# Patient Record
Sex: Female | Born: 1965
Health system: Southern US, Community
[De-identification: ages and names within clinical notes are randomized; demographics above are authoritative.]

## PROBLEM LIST (undated history)

## (undated) DIAGNOSIS — M722 Plantar fascial fibromatosis: Secondary | ICD-10-CM

## (undated) DIAGNOSIS — K5900 Constipation, unspecified: Secondary | ICD-10-CM

## (undated) DIAGNOSIS — F32A Depression, unspecified: Secondary | ICD-10-CM

## (undated) DIAGNOSIS — R0602 Shortness of breath: Secondary | ICD-10-CM

## (undated) DIAGNOSIS — M549 Dorsalgia, unspecified: Secondary | ICD-10-CM

## (undated) DIAGNOSIS — E039 Hypothyroidism, unspecified: Secondary | ICD-10-CM

## (undated) DIAGNOSIS — F419 Anxiety disorder, unspecified: Secondary | ICD-10-CM

## (undated) DIAGNOSIS — A609 Anogenital herpesviral infection, unspecified: Secondary | ICD-10-CM

## (undated) DIAGNOSIS — F329 Major depressive disorder, single episode, unspecified: Secondary | ICD-10-CM

## (undated) DIAGNOSIS — C50919 Malignant neoplasm of unspecified site of unspecified female breast: Secondary | ICD-10-CM

## (undated) DIAGNOSIS — T7840XA Allergy, unspecified, initial encounter: Secondary | ICD-10-CM

## (undated) DIAGNOSIS — M2550 Pain in unspecified joint: Secondary | ICD-10-CM

## (undated) DIAGNOSIS — E785 Hyperlipidemia, unspecified: Secondary | ICD-10-CM

## (undated) HISTORY — DX: Hyperlipidemia, unspecified: E78.5

## (undated) HISTORY — PX: ENDOMETRIAL ABLATION: SHX621

## (undated) HISTORY — DX: Plantar fascial fibromatosis: M72.2

## (undated) HISTORY — DX: Pain in unspecified joint: M25.50

## (undated) HISTORY — DX: Allergy, unspecified, initial encounter: T78.40XA

## (undated) HISTORY — PX: MOUTH SURGERY: SHX715

## (undated) HISTORY — DX: Shortness of breath: R06.02

## (undated) HISTORY — DX: Dorsalgia, unspecified: M54.9

## (undated) HISTORY — DX: Anxiety disorder, unspecified: F41.9

## (undated) HISTORY — DX: Constipation, unspecified: K59.00

---

## 1999-07-23 ENCOUNTER — Encounter: Payer: Self-pay | Admitting: *Deleted

## 1999-07-23 ENCOUNTER — Encounter: Admission: RE | Admit: 1999-07-23 | Discharge: 1999-07-23 | Payer: Self-pay | Admitting: *Deleted

## 1999-09-09 ENCOUNTER — Other Ambulatory Visit: Admission: RE | Admit: 1999-09-09 | Discharge: 1999-09-09 | Payer: Self-pay | Admitting: Obstetrics and Gynecology

## 1999-12-12 ENCOUNTER — Other Ambulatory Visit: Admission: RE | Admit: 1999-12-12 | Discharge: 1999-12-12 | Payer: Self-pay | Admitting: Obstetrics and Gynecology

## 2000-02-12 ENCOUNTER — Encounter: Payer: Self-pay | Admitting: Obstetrics and Gynecology

## 2000-02-12 ENCOUNTER — Ambulatory Visit (HOSPITAL_COMMUNITY): Admission: RE | Admit: 2000-02-12 | Discharge: 2000-02-12 | Payer: Self-pay | Admitting: Obstetrics and Gynecology

## 2000-10-13 ENCOUNTER — Inpatient Hospital Stay (HOSPITAL_COMMUNITY): Admission: AD | Admit: 2000-10-13 | Discharge: 2000-10-13 | Payer: Self-pay | Admitting: Gynecology

## 2001-03-18 ENCOUNTER — Other Ambulatory Visit: Admission: RE | Admit: 2001-03-18 | Discharge: 2001-03-18 | Payer: Self-pay | Admitting: Gynecology

## 2001-12-13 ENCOUNTER — Inpatient Hospital Stay (HOSPITAL_COMMUNITY): Admission: AD | Admit: 2001-12-13 | Discharge: 2001-12-13 | Payer: Self-pay | Admitting: Obstetrics and Gynecology

## 2002-03-06 ENCOUNTER — Inpatient Hospital Stay (HOSPITAL_COMMUNITY): Admission: AD | Admit: 2002-03-06 | Discharge: 2002-03-09 | Payer: Self-pay | Admitting: Obstetrics and Gynecology

## 2002-03-10 ENCOUNTER — Encounter: Admission: RE | Admit: 2002-03-10 | Discharge: 2002-04-09 | Payer: Self-pay | Admitting: Obstetrics and Gynecology

## 2002-04-08 ENCOUNTER — Other Ambulatory Visit: Admission: RE | Admit: 2002-04-08 | Discharge: 2002-04-08 | Payer: Self-pay | Admitting: Obstetrics and Gynecology

## 2002-11-17 ENCOUNTER — Encounter: Admission: RE | Admit: 2002-11-17 | Discharge: 2002-11-17 | Payer: Self-pay

## 2003-06-14 ENCOUNTER — Other Ambulatory Visit: Admission: RE | Admit: 2003-06-14 | Discharge: 2003-06-14 | Payer: Self-pay | Admitting: Obstetrics and Gynecology

## 2003-08-09 ENCOUNTER — Ambulatory Visit (HOSPITAL_COMMUNITY): Admission: RE | Admit: 2003-08-09 | Discharge: 2003-08-09 | Payer: Self-pay | Admitting: Obstetrics and Gynecology

## 2003-08-09 IMAGING — US US OB DETAIL+14 WK
1 series · 13 of 28 positions shown · non-contrast
Comparison: none

CLINICAL DATA: Advanced maternal age.  Assess anatomy.  LMP [DATE].  G3 P1 SAB1.

[Series 1: ob · 0.26mm/px · 13 of 80 slices shown]
[im 3/80]
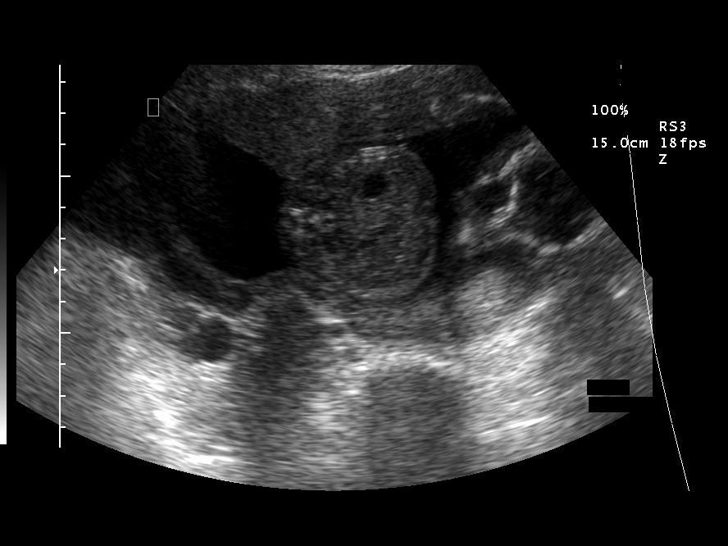
[im 9/80]
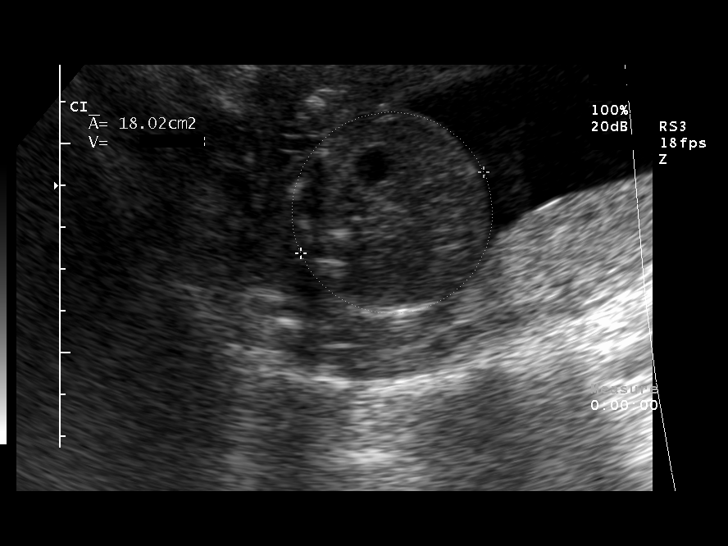
[im 15/80]
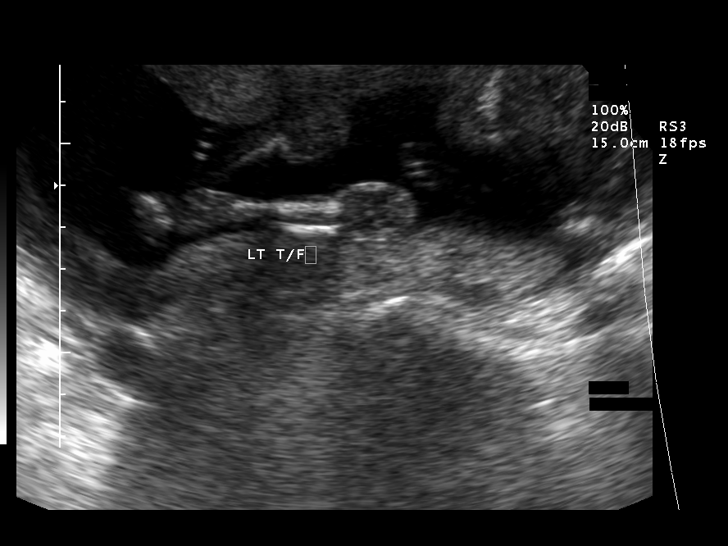
[im 21/80]
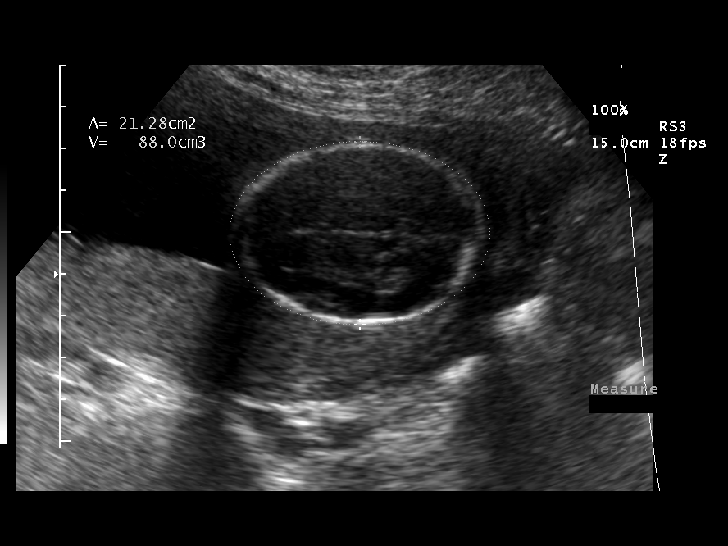
[im 27/80]
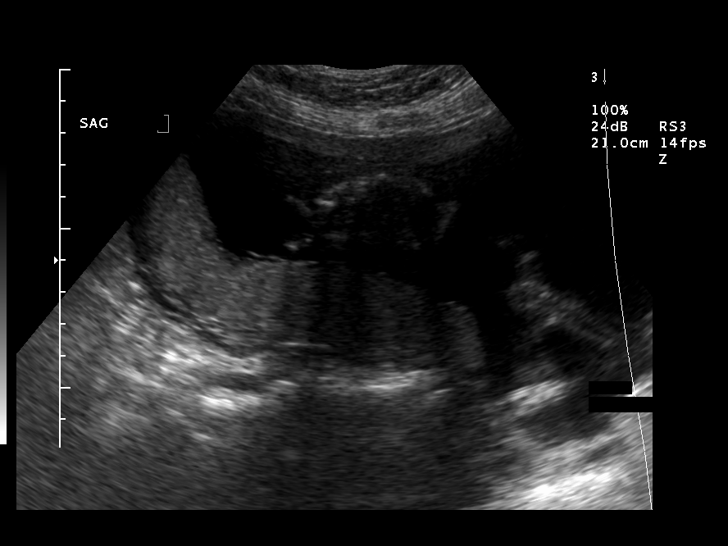
[im 33/80]
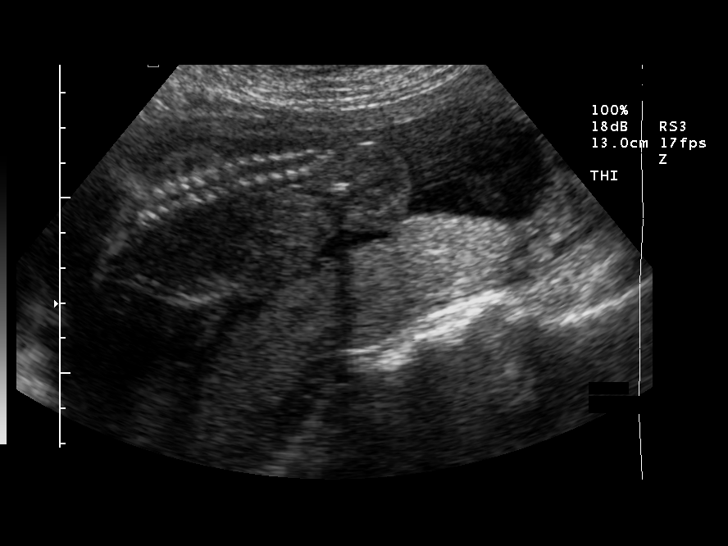
[im 41/80]
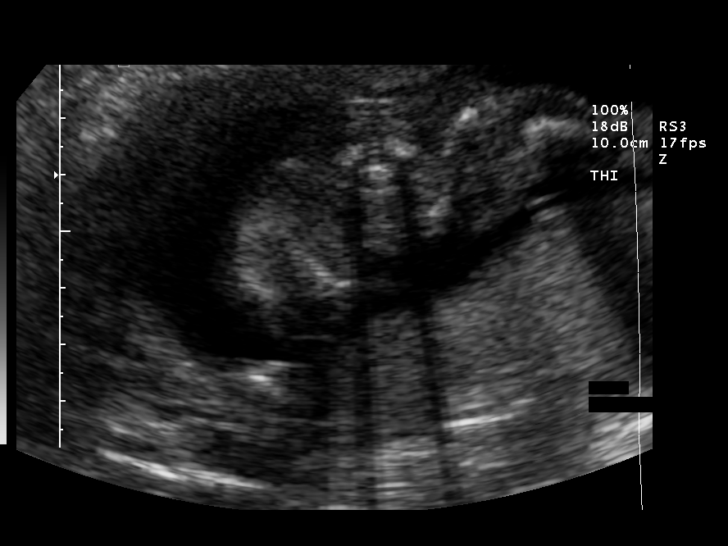
[im 47/80]
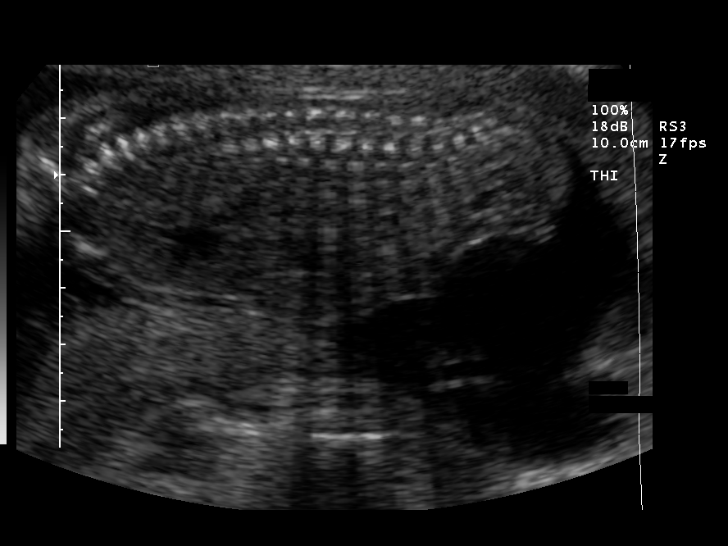
[im 53/80]
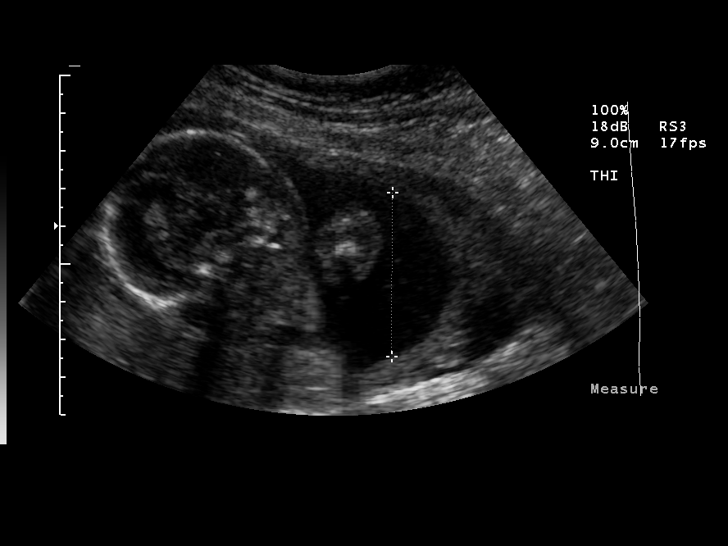
[im 59/80]
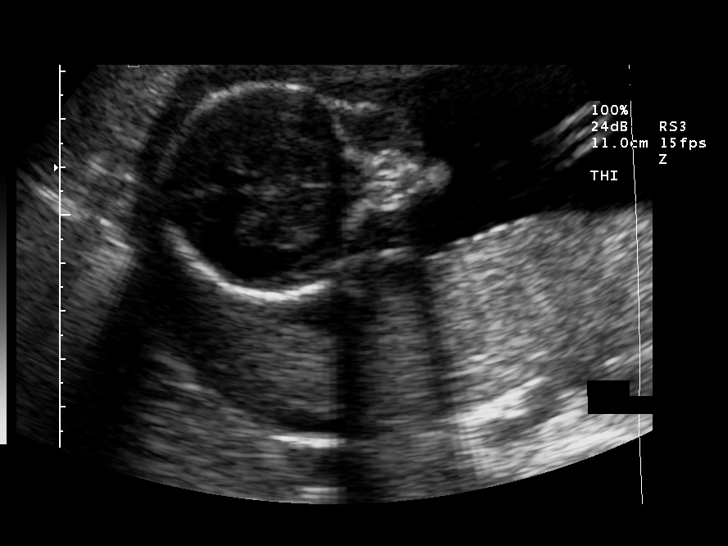
[im 65/80]
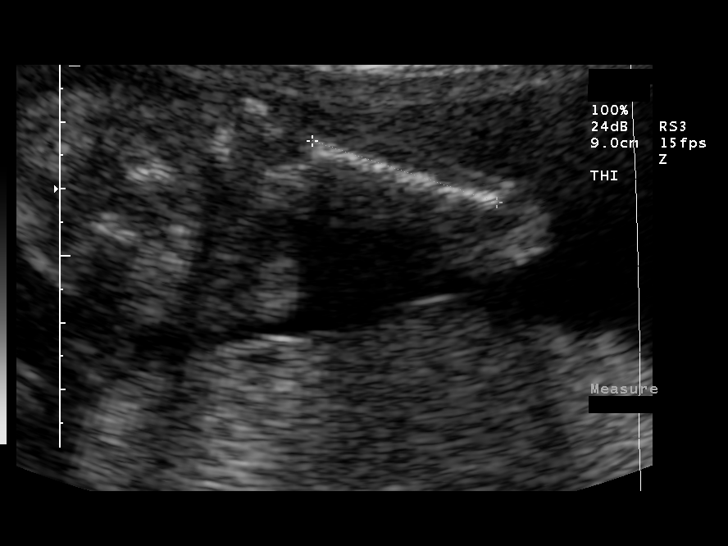
[im 71/80]
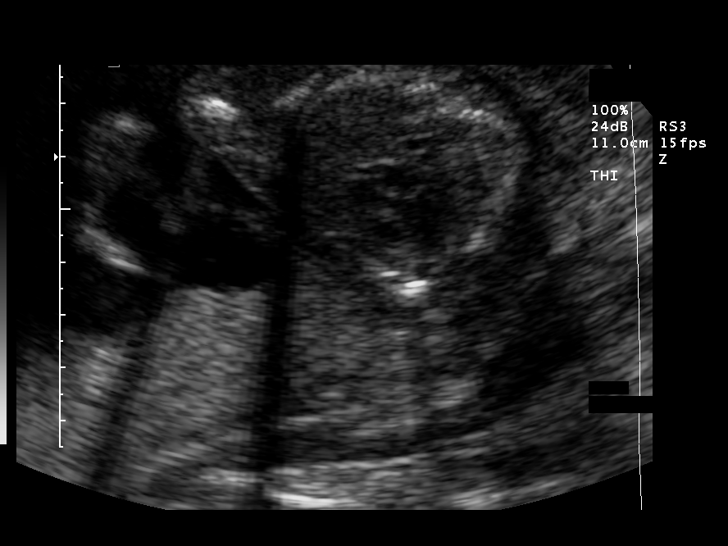
[im 77/80]
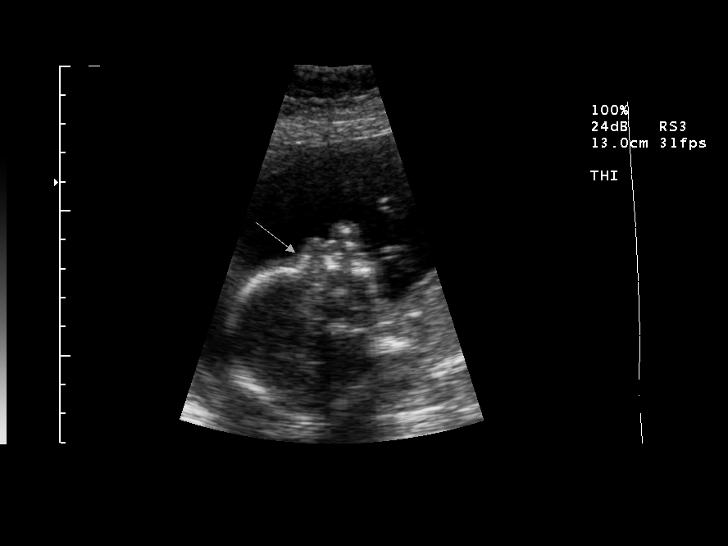

[13 of 28 positions shown; findings below may reference images not displayed]

DETAILED OBSTETRICAL ULTRASOUND 

 Number of Fetuses:  1
 Heart Rate:  155
 Movement:  Yes
 Breathing:  Yes
 Presentation:  Variable
 Placental Location:  Posterior
 Grade:  I
 Previa:  No
 Amniotic Fluid (Subjective):  Normal
 Amniotic Fluid (Objective):  4.3 cm Vertical pocket 

 FETAL BIOMETRY
 BPD:  4.4 cm   19 w 2 d
 HC:  16.8 cm   19 w 3 d
 AC:  15.2 cm   20 w 3 d
 FL:  3.0 cm  19 w 1 d
 HL:  3.0 cm   19 w 6 d

 MEAN GA:  19 w 4 d
 GA BY LMP:  18 w 6 d (assigned)

 FETAL ANATOMY
 Lateral Ventricles:  Visualized 
 Thalami/CSP:  Visualized 
 Posterior Fossa:  Visualized 
 Nuchal Region:  Visualized 
 Spine:  Visualized 
 4 Chamber Heart on Left:  Visualized 
 Stomach on Left:  Visualized 
 3 Vessel Cord:  Visualized 
 Cord Insertion Site:  Visualized 
 Kidneys:  Visualized 
 Bladder:  Visualized 
 Extremities:  Visualized 

 ADDITIONAL ANATOMY VISUALIZED:  Upper lip, orbits, diaphragm, heel, 5th digit, female genitalia, and nasal bone.

 Evaluation limited by:  Fetal position

 MATERNAL FINDINGS
 Cervix:  4.0 cm Transabdominally
IMPRESSION: Single living intrauterine fetus in variable presentation.  Patient’s assigned gestational age is 18 weeks 6 days by LMP dating.  She measures 19 weeks 4 days today which is within measurement variation at this gestational age.
 Cardiac outflow tracts, profile, ductal and aortic arches not seen.  Otherwise, no anatomic abnormality noted.  No sonographic markers for Down syndrome identified.  Patient was informed that ultrasound cannot exclude Down syndrome.

 </u12:p>

## 2003-10-13 ENCOUNTER — Inpatient Hospital Stay (HOSPITAL_COMMUNITY): Admission: AD | Admit: 2003-10-13 | Discharge: 2003-10-13 | Payer: Self-pay | Admitting: Obstetrics and Gynecology

## 2004-01-04 ENCOUNTER — Inpatient Hospital Stay (HOSPITAL_COMMUNITY): Admission: AD | Admit: 2004-01-04 | Discharge: 2004-01-06 | Payer: Self-pay | Admitting: Obstetrics and Gynecology

## 2004-01-09 ENCOUNTER — Encounter: Admission: RE | Admit: 2004-01-09 | Discharge: 2004-02-08 | Payer: Self-pay | Admitting: Obstetrics and Gynecology

## 2004-06-06 ENCOUNTER — Other Ambulatory Visit: Admission: RE | Admit: 2004-06-06 | Discharge: 2004-06-06 | Payer: Self-pay | Admitting: Obstetrics and Gynecology

## 2005-07-08 ENCOUNTER — Other Ambulatory Visit: Admission: RE | Admit: 2005-07-08 | Discharge: 2005-07-08 | Payer: Self-pay | Admitting: Obstetrics and Gynecology

## 2005-07-11 ENCOUNTER — Ambulatory Visit (HOSPITAL_COMMUNITY): Admission: RE | Admit: 2005-07-11 | Discharge: 2005-07-11 | Payer: Self-pay | Admitting: Obstetrics and Gynecology

## 2010-03-21 ENCOUNTER — Ambulatory Visit (HOSPITAL_COMMUNITY): Admission: RE | Admit: 2010-03-21 | Discharge: 2010-03-21 | Payer: Self-pay | Admitting: Family Medicine

## 2010-03-21 IMAGING — MG MM DIGITAL SCREENING
4 series · 4 of 4 positions shown · non-contrast
Comparison: Prior studies.

DG SCREEN MAMMOGRAM BILATERAL
Bilateral CC and MLO view(s) were taken.

DIGITAL SCREENING MAMMOGRAM WITH CAD:

[R CC]
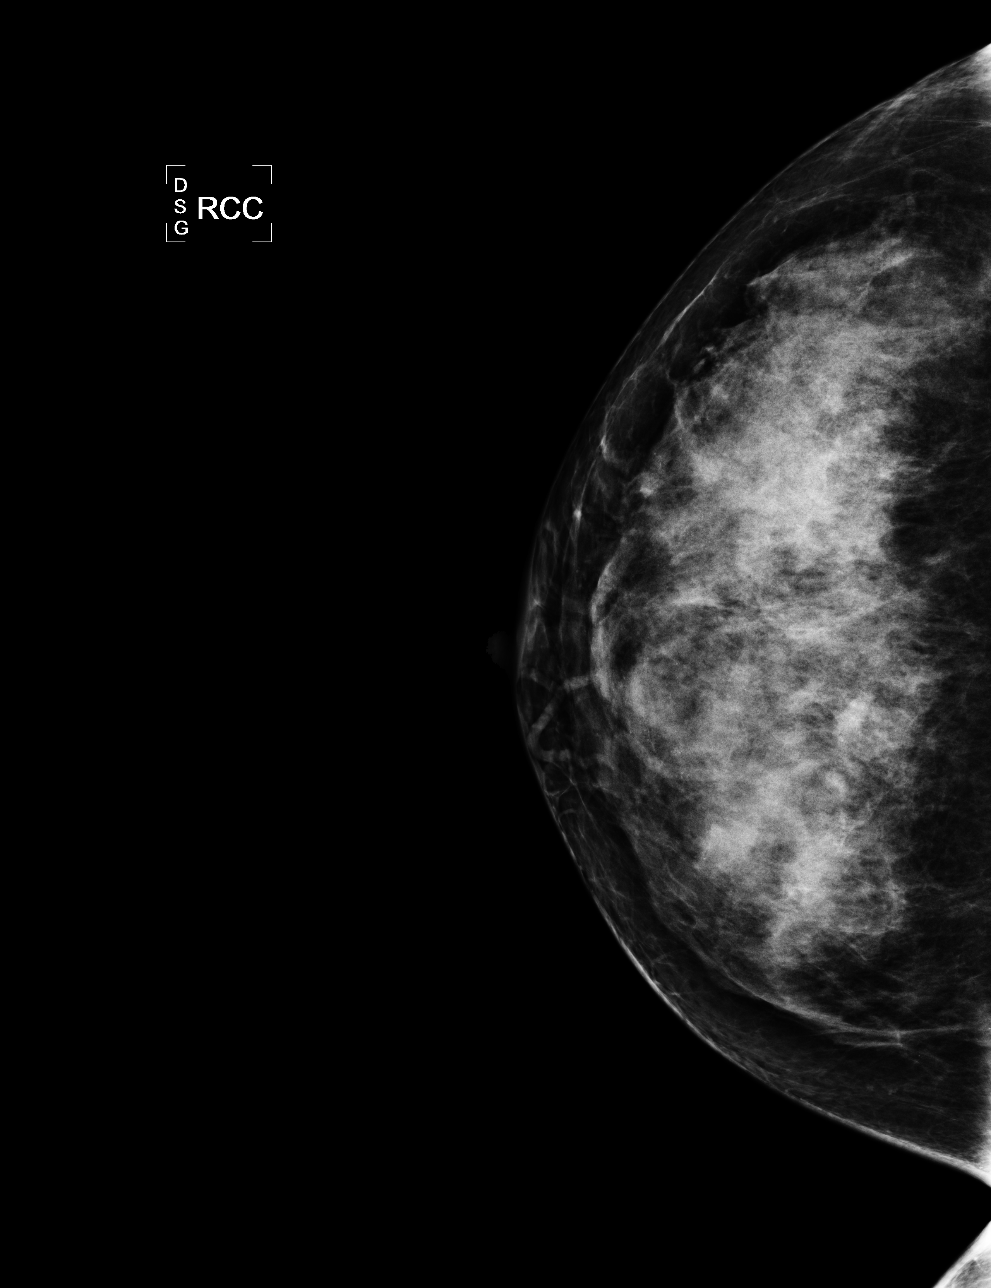

[R MLO]
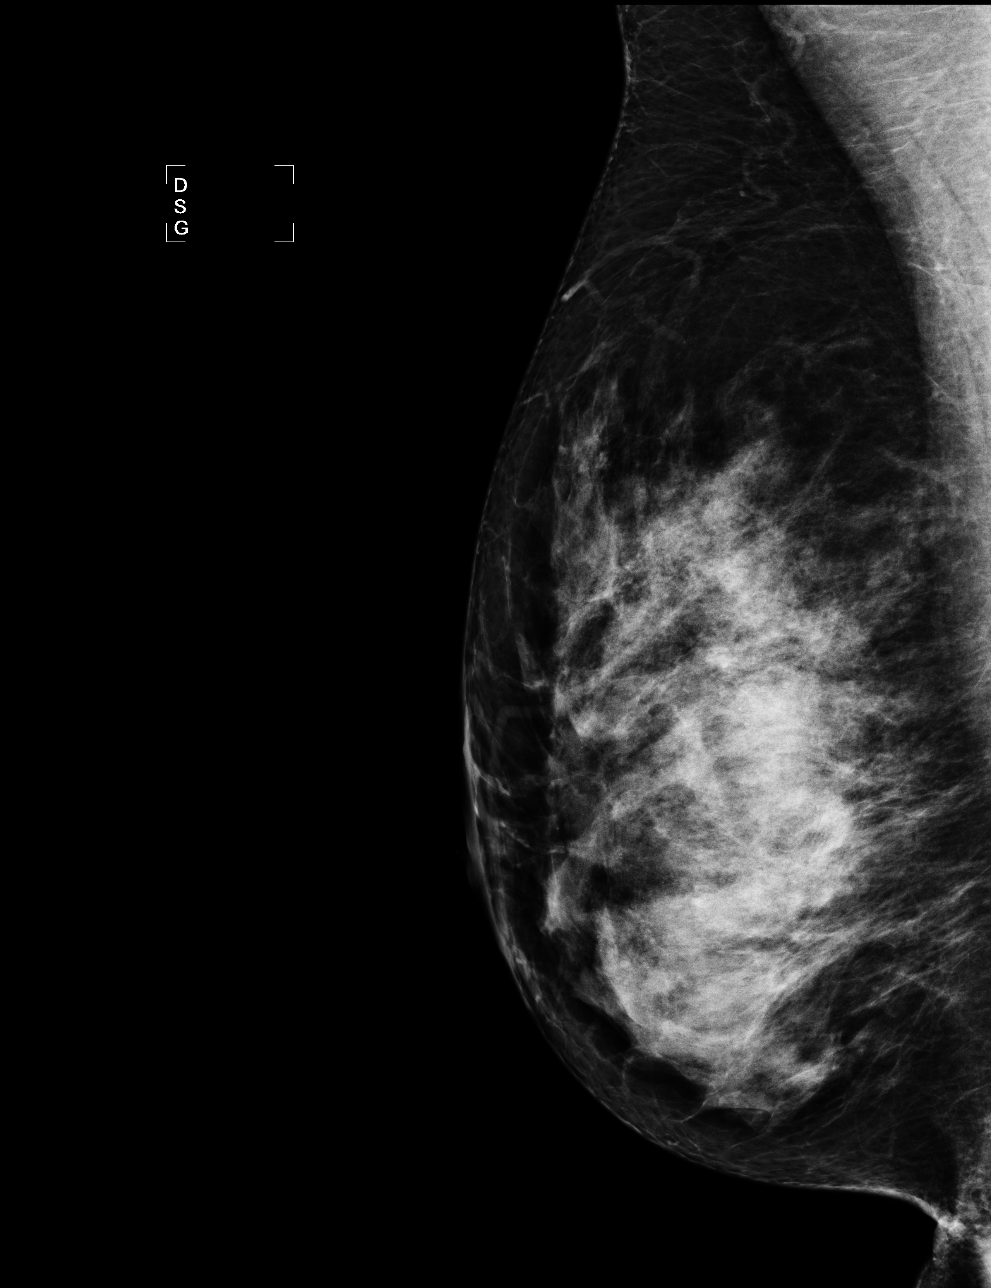

[L CC]
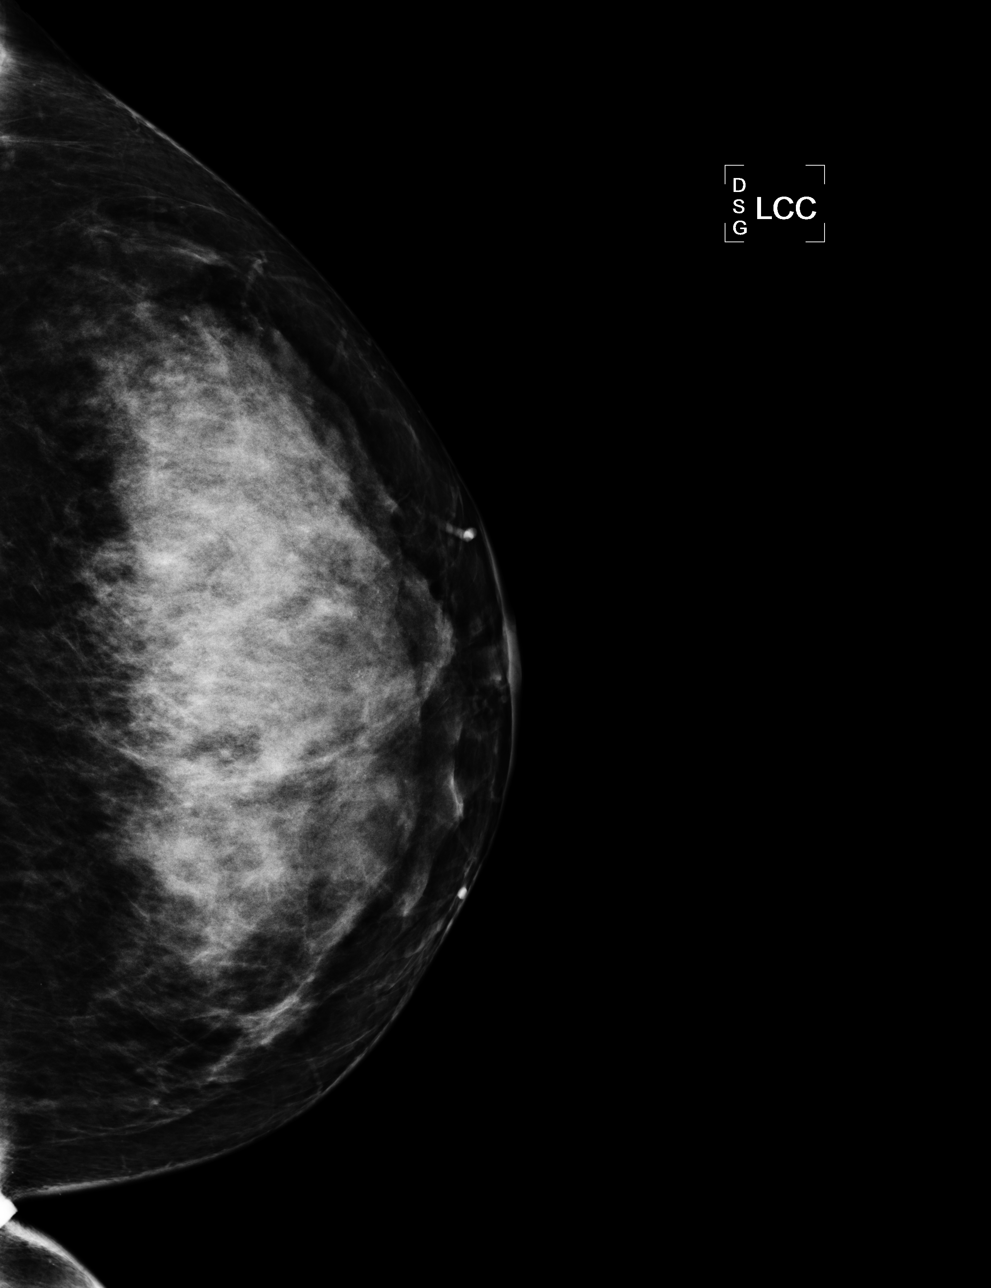

[L MLO]
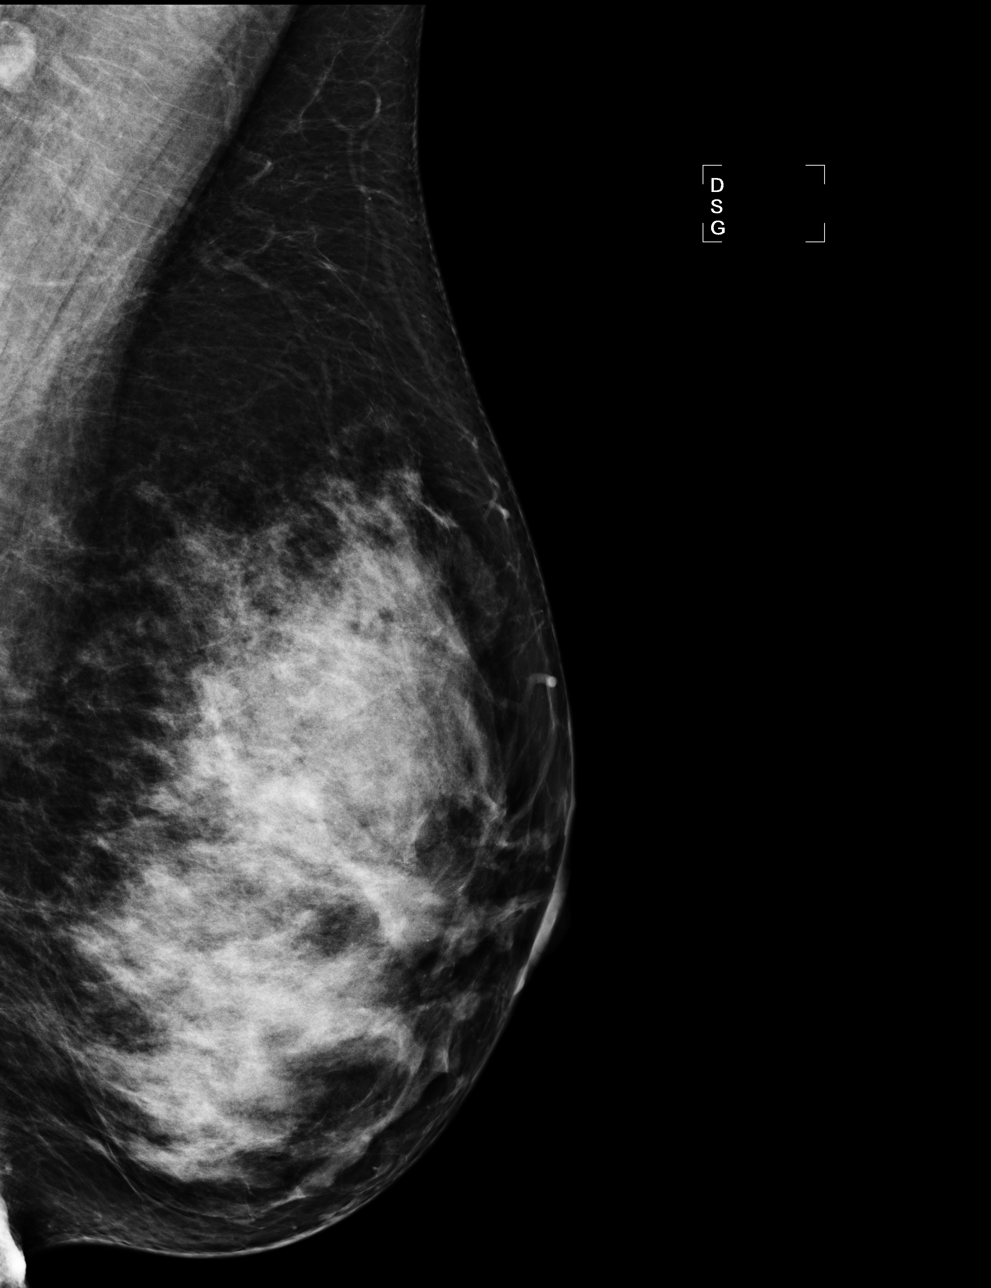

[4 of 4 positions shown; findings below may reference images not displayed]

The breast tissue is extremely dense.  There is no dominant mass, architectural distortion or 
calcification to suggest malignancy.

Images were processed with CAD.
IMPRESSION: No mammographic evidence of malignancy.  Suggest yearly screening mammography.

A result letter of this screening mammogram will be mailed directly to the patient.

ASSESSMENT: Negative - BI-RADS 1

Screening mammogram in 1 year.
,

## 2010-11-22 NOTE — H&P (Signed)
NAME:  Tara Yates, Tara Yates                          ACCOUNT NO.:  [id number]   MEDICAL RECORD NO.:  [id number]                   PATIENT TYPE:  INP   LOCATION:  9162                                 FACILITY:  [facility]   PHYSICIAN:  Naima A. Dillard, M.D.              DATE OF BIRTH:  Apr 29, 1966   DATE OF ADMISSION:  03/06/2002  DATE OF DISCHARGE:                                HISTORY & PHYSICAL   HISTORY OF PRESENT ILLNESS:  The patient is a 45 year old gravida 3 para 0-0-  2-0 who presents at 15 and four-sevenths weeks, EDD March 02, 2002 as  determined by seven-week pregnancy ultrasonography and confirmed with follow-  up.  She has reported contractions throughout the day, became stronger and  more regular, positive fetal movement.  No bleeding, no rupture of  membranes.  Denies any headache, visual changes, or epigastric pain.  Her  pregnancy has been followed by the CNM service at [facility] and is remarkable  for:  1. History of infertility.  2. Rh negative.  3. History of cryosurgery.  4. History of irregular cycles.  5. Two ABs.  6. Group B strep positive.   This patient was initially evaluated at the office of [facility] on August 17, 2001 at approximately [redacted] weeks gestation.  EDC determined by early pregnancy  ultrasonography at seven weeks and confirmed with follow-up.  The patient  had a history of infertility and was a patient of Dr. Chevis Pretty.  Her pregnancy  has been unremarkable.  She has been size equal to dates throughout,  normotensive, with no proteinuria.   PRENATAL LABORATORY DATA:  On August 17, 2001, hemoglobin and hematocrit  13.2 and 37.9; platelets 286,000.  Blood type and Rh B negative, antibody  screen negative.  VDRL nonreactive.  Rubella immune.  Hepatitis B surface  antigen negative.  HIV declined.  Pap smear within normal limits.  GC and  chlamydia negative.  AFP/free beta hCG within normal range.  At 28 weeks,  one-hour glucose challenge 99 and hemoglobin 11.4.   At 36 weeks, culture of  the vaginal tract is positive for group B strep.   OBSTETRICAL HISTORY:  In 1991, elective AB, no complications; in 2002,  spontaneous AB with no complications; and the present pregnancy.   MEDICAL HISTORY:  History of abnormal Pap and cryosurgery.  History of  endometriosis.   SURGICAL HISTORY:  Laparoscopy for endometriosis in 2002.   FAMILY HISTORY:  Patient's mother, maternal grandmother, maternal  grandfather, and maternal uncle with a history of MI.  The patient's mother,  maternal grandmother, maternal grandfather, and maternal uncle with history  of heart disease.  The patient's maternal grandmother and maternal  grandfather with a history of hypertension.  Scleroderma in the patient's  mother and sister.  Paternal grandmother with a history of breast cancer.  Maternal grandfather with stroke.   GENETIC HISTORY:  The father of the  baby was adopted and does not know his  genetic history.  For the patient, there is no family history of familial or  genetic disorders, children that died in infancy or that were born with  birth defects.   ALLERGIES:  No known drug allergies.   HABITS:  The patient denies the use of tobacco, alcohol, or illicit drugs.   SOCIAL HISTORY:  The patient is a 45 year old Caucasian married female.  Her  husband, Ajayla Iglesias, is involved and supportive.  They are [place] in  their faith.   REVIEW OF SYSTEMS:  There are no signs or symptoms suggestive of focal or  systemic disease and the patient is typical of one with a uterine pregnancy  at term in early active labor.   PHYSICAL EXAMINATION:  VITAL SIGNS:  Stable, afebrile.  HEENT:  Unremarkable.  HEART:  Regular rate and rhythm.  LUNGS:  Clear.  ABDOMEN:  Gravid in it contour.  Uterine fundus is noted to extend 39 cm  above the level of the pubic symphysis.  Leopold's maneuvers finds the  infant to be a in a longitudinal lie, cephalic presentation, and the   estimated fetal weight is 7.5 pounds.  The fetal heart rate is reactive and  reassuring.  The patient is contracting every two to three minutes.  PELVIC:  Digital exam of the cervix finds it to be 4-5 cm dilated, 90%  effaced, with the cephalic presenting part at a -1 station.  Spontaneous  rupture of membranes with exam for light meconium-stained fluid.  EXTREMITIES:  Show no pathologic edema.  DTRs are 1+ with no clonus.   ASSESSMENT:  1. Intrauterine pregnancy.  2. Early active labor.   PLAN:  1. Admit per Dr. Jaymes Graff.  2. Routine C.N.M. orders.  3. Penicillin G prophylaxis for positive group B strep.  4. The patient may have epidural.  5. Anticipate spontaneous vaginal delivery.   The patient's birth plan has been reviewed, as well as the plan as presented  above.  The patient verbalizes her understanding.     Rica Koyanagi, C.N.M.               Naima A. Normand Sloop, M.D.    SDM/MEDQ  D:  03/07/2002  T:  03/07/2002  Job:  [id number]

## 2010-11-22 NOTE — H&P (Signed)
NAME:  BRITTINIE, WHERLEY                          ACCOUNT NO.:  [id number]   MEDICAL RECORD NO.:  [id number]                   PATIENT TYPE:  INP   LOCATION:  9168                                 FACILITY:  [facility]   PHYSICIAN:  Naima A. Dillard, M.D.              DATE OF BIRTH:  08-15-1965   DATE OF ADMISSION:  01/04/2004  DATE OF DISCHARGE:                                HISTORY & PHYSICAL   HISTORY OF PRESENT ILLNESS:  Ms. Currier is a 45 year old married white  female, gravida 4, para 1-0-2-1, at 63 and 0/7ths weeks, who presents with  regular uterine contractions this evening.  She denies leaking, bleeding,  headache, nausea, vomiting, or visual disturbances.  Her pregnancy has been  followed by the [facility], and  has been remarkable for (1) AMA with no amniocentesis, (2) history of  infertility, (3) family history of scleroderma, (4) history of cryotherapy,  (5) history of LGA, (6) one TAB/one SAB, (7) group B strep positive, (8) Rh  negative.   PRENATAL LABORATORIES:  Her prenatal labs were collected on June 26, 2003.  Hemoglobin was 13.0, hematocrit 39, platelets 245,000, blood typical  B negative, antibody negative, RPR nonreactive, rubella immune, hepatitis B  surface antigen negative, RPR nonreactive.  Pap smear within normal limits.  Gonorrhea negative.  Chlamydia negative.  One hour Glucola on October 06, 2003  was 81, and her hemoglobin at that time was 11.5.  Culture of the vaginal  tract for group B strep on December 07, 2003 was positive, and for gonorrhea and  Chlamydia on that same day were both negative.   HISTORY OF PRESENT PREGNANCY:  The patient presented for care at [facility] on June 14, 2003 at [redacted] weeks gestation.  The patient declined  amniocentesis.  Pregnancy ultrasonography on August 09, 2003 at [redacted] weeks  gestation showed growth consistent with previous dating.  Her quad screen  was within normal limits.   She was treated for a sinus infection at [redacted] weeks  gestation with a Z-Pak.  The patient received RhoGAM at [redacted] weeks gestation.  The rest of her prenatal care was unremarkable.   OBSTETRICAL HISTORY:  She is a gravida 4, para 1-0-2-1.  In 1991, at 5-[redacted]  weeks gestation, she had a TAB without complications.  In 2002, at 5-[redacted] weeks  gestation, she had an SAB with no D&C.  In August of 2003, she had a vaginal  delivery of a female infant weighing 9 pounds and 3 ounces, at 40 and 4/7ths  weeks gestation after 24 hours of labor.  She had an epidural for  anesthesia.  The infant's name was Gerre Pebbles.  She received RhoGAM with her  pregnancy pregnancies.   MEDICAL HISTORY:  She has no medication allergies.  She has use Ortho Tri-  Cyclen in the past for contraception.  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  She has a history of infertility, and had a  diagnostic laparoscopy in 2002 with a diagnosis of endometriosis.  In 2002,  she took Clomid and had an SAB.  In 2003, she used HCG injections.  She has  a history of __________ on her Pap smears.  She has occasional yeast  infections.  She reports having had the usual childhood illnesses.  She had  group B strep with her previous pregnancy.  She had a urinary tract  infection approximately 5 years ago.   SURGICAL HISTORY:  Remarkable for a diagnostic laparoscopy in 2002.   FAMILY MEDICAL HISTORY:  Remarkable for multiple family members with MI and  hypertension.  Her mother has a history of scleroderma.  Paternal  grandmother with breast cancer.   GENETIC HISTORY:  Remarkable for patient at the age of 89, as well as father  of the baby adopted.   SOCIAL HISTORY:  The patient is married to the father of the baby.  His name  is Trey Paula.  He is involved and supportive.  They are both college educated and  employed full time.  The patient is in health education.  The father of the  baby is a Clinical research associate.  They deny any alcohol,  tobacco, or illicit drug use with  the pregnancy.   OBJECTIVE DATA:  VITAL SIGNS:  Stable.  She is afebrile.  HEENT:  Grossly within normal limits.  CHEST:  Clear to auscultation.  HEART:  Regular rate and rhythm.  ABDOMEN:  Gravid in contour with fundal height extending approximately 40 cm  above the pubic symphysis.  Fetal heart rate is reactive and reassuring.  Uterine contractions every 2-3 minutes.  PELVIC:  Cervix was 4 cm, 80%, vertex -2, with intact membranes.  EXTREMITIES:  Normal.   ASSESSMENT:  1. Intrauterine pregnancy at term.  2. Early active labor.   PLAN:  1. Admit to birthing suits, and Dr. Normand Sloop has been notified.  2. Routine CNM orders.  3. The patient plans epidural for pain.  4. Penicillin G will be started for group B strep prophylaxis.     Cam Hai, C.N.M.                     Naima A. Normand Sloop, M.D.    KS/MEDQ  D:  01/04/2004  T:  01/04/2004  Job:  [id number]

## 2011-07-20 ENCOUNTER — Ambulatory Visit (INDEPENDENT_AMBULATORY_CARE_PROVIDER_SITE_OTHER): Payer: 59

## 2011-07-20 DIAGNOSIS — J019 Acute sinusitis, unspecified: Secondary | ICD-10-CM

## 2011-09-25 ENCOUNTER — Ambulatory Visit (INDEPENDENT_AMBULATORY_CARE_PROVIDER_SITE_OTHER): Payer: 59 | Admitting: Obstetrics and Gynecology

## 2011-09-25 DIAGNOSIS — Z01419 Encounter for gynecological examination (general) (routine) without abnormal findings: Secondary | ICD-10-CM

## 2011-09-29 ENCOUNTER — Ambulatory Visit: Payer: Self-pay | Admitting: Obstetrics and Gynecology

## 2012-10-13 ENCOUNTER — Other Ambulatory Visit: Payer: Self-pay | Admitting: Obstetrics and Gynecology

## 2012-10-13 DIAGNOSIS — Z1231 Encounter for screening mammogram for malignant neoplasm of breast: Secondary | ICD-10-CM

## 2012-10-19 ENCOUNTER — Ambulatory Visit (HOSPITAL_COMMUNITY)
Admission: RE | Admit: 2012-10-19 | Discharge: 2012-10-19 | Disposition: A | Discharge status: Home / Self Care | Payer: PRIVATE HEALTH INSURANCE | Source: Ambulatory Visit | Attending: Obstetrics and Gynecology | Admitting: Obstetrics and Gynecology

## 2012-10-19 DIAGNOSIS — Z1231 Encounter for screening mammogram for malignant neoplasm of breast: Secondary | ICD-10-CM

## 2014-08-11 ENCOUNTER — Other Ambulatory Visit (HOSPITAL_COMMUNITY): Payer: Self-pay | Admitting: Obstetrics and Gynecology

## 2014-08-11 DIAGNOSIS — Z1231 Encounter for screening mammogram for malignant neoplasm of breast: Secondary | ICD-10-CM

## 2014-08-16 ENCOUNTER — Ambulatory Visit (HOSPITAL_COMMUNITY)
Admission: RE | Admit: 2014-08-16 | Discharge: 2014-08-16 | Disposition: A | Discharge status: Home / Self Care | Payer: BLUE CROSS/BLUE SHIELD | Source: Ambulatory Visit | Attending: Obstetrics and Gynecology | Admitting: Obstetrics and Gynecology

## 2014-08-16 DIAGNOSIS — Z1231 Encounter for screening mammogram for malignant neoplasm of breast: Secondary | ICD-10-CM | POA: Insufficient documentation

## 2014-08-17 ENCOUNTER — Other Ambulatory Visit: Payer: Self-pay | Admitting: Obstetrics and Gynecology

## 2014-08-17 DIAGNOSIS — R928 Other abnormal and inconclusive findings on diagnostic imaging of breast: Secondary | ICD-10-CM

## 2014-08-23 ENCOUNTER — Ambulatory Visit
Admission: RE | Admit: 2014-08-23 | Discharge: 2014-08-23 | Disposition: A | Discharge status: Home / Self Care | Payer: BLUE CROSS/BLUE SHIELD | Source: Ambulatory Visit | Attending: Obstetrics and Gynecology | Admitting: Obstetrics and Gynecology

## 2014-08-23 ENCOUNTER — Ambulatory Visit
Admission: RE | Admit: 2014-08-23 | Discharge: 2014-08-23 | Disposition: A | Discharge status: Home / Self Care | Payer: PRIVATE HEALTH INSURANCE | Source: Ambulatory Visit | Attending: Obstetrics and Gynecology | Admitting: Obstetrics and Gynecology

## 2014-08-23 DIAGNOSIS — R928 Other abnormal and inconclusive findings on diagnostic imaging of breast: Secondary | ICD-10-CM

## 2014-08-24 ENCOUNTER — Other Ambulatory Visit: Payer: Self-pay | Admitting: Obstetrics and Gynecology

## 2014-08-24 DIAGNOSIS — R928 Other abnormal and inconclusive findings on diagnostic imaging of breast: Secondary | ICD-10-CM

## 2014-09-05 ENCOUNTER — Other Ambulatory Visit: Payer: BLUE CROSS/BLUE SHIELD

## 2014-09-14 DIAGNOSIS — E039 Hypothyroidism, unspecified: Secondary | ICD-10-CM | POA: Insufficient documentation

## 2014-09-14 DIAGNOSIS — M545 Low back pain, unspecified: Secondary | ICD-10-CM | POA: Insufficient documentation

## 2014-09-14 DIAGNOSIS — G4700 Insomnia, unspecified: Secondary | ICD-10-CM | POA: Insufficient documentation

## 2014-09-14 DIAGNOSIS — R140 Abdominal distension (gaseous): Secondary | ICD-10-CM | POA: Insufficient documentation

## 2014-09-14 DIAGNOSIS — M2550 Pain in unspecified joint: Secondary | ICD-10-CM | POA: Insufficient documentation

## 2014-09-14 DIAGNOSIS — K5900 Constipation, unspecified: Secondary | ICD-10-CM | POA: Insufficient documentation

## 2014-09-28 ENCOUNTER — Other Ambulatory Visit: Payer: Self-pay | Admitting: Obstetrics and Gynecology

## 2014-09-28 DIAGNOSIS — R928 Other abnormal and inconclusive findings on diagnostic imaging of breast: Secondary | ICD-10-CM

## 2014-10-02 ENCOUNTER — Other Ambulatory Visit: Payer: BLUE CROSS/BLUE SHIELD

## 2014-10-09 ENCOUNTER — Ambulatory Visit
Admission: RE | Admit: 2014-10-09 | Discharge: 2014-10-09 | Disposition: A | Discharge status: Home / Self Care | Payer: BLUE CROSS/BLUE SHIELD | Source: Ambulatory Visit | Attending: Obstetrics and Gynecology | Admitting: Obstetrics and Gynecology

## 2014-10-09 DIAGNOSIS — R928 Other abnormal and inconclusive findings on diagnostic imaging of breast: Secondary | ICD-10-CM

## 2015-01-16 ENCOUNTER — Other Ambulatory Visit: Payer: Self-pay | Admitting: Obstetrics and Gynecology

## 2015-01-16 DIAGNOSIS — N6310 Unspecified lump in the right breast, unspecified quadrant: Secondary | ICD-10-CM

## 2015-02-14 DIAGNOSIS — B029 Zoster without complications: Secondary | ICD-10-CM | POA: Insufficient documentation

## 2015-02-26 ENCOUNTER — Other Ambulatory Visit: Payer: Self-pay | Admitting: Obstetrics and Gynecology

## 2015-02-26 ENCOUNTER — Ambulatory Visit
Admission: RE | Admit: 2015-02-26 | Discharge: 2015-02-26 | Disposition: A | Discharge status: Home / Self Care | Payer: BLUE CROSS/BLUE SHIELD | Source: Ambulatory Visit | Attending: Obstetrics and Gynecology | Admitting: Obstetrics and Gynecology

## 2015-02-26 DIAGNOSIS — N6310 Unspecified lump in the right breast, unspecified quadrant: Secondary | ICD-10-CM

## 2015-02-26 IMAGING — US US BREAST LTD UNI RIGHT INC AXILLA
1 series · 8 of 8 positions shown · non-contrast
Comparison: Previous exam(s).

CLINICAL DATA: Followup possible distortion in the central right
breast and probably benign right breast calcifications.

EXAM:
DIGITAL DIAGNOSTIC RIGHT MAMMOGRAM WITH 3D TOMOSYNTHESIS WITH CAD
ULTRASOUND RIGHT BREAST

[Series 1: advbreast · 8 of 8 slices shown]
[im 1/8]
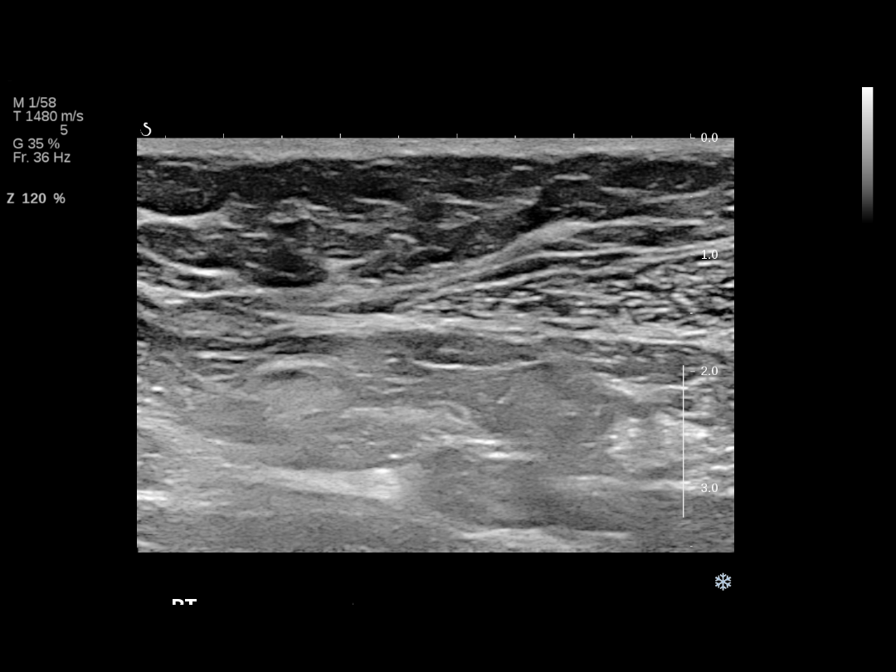
[im 2/8]
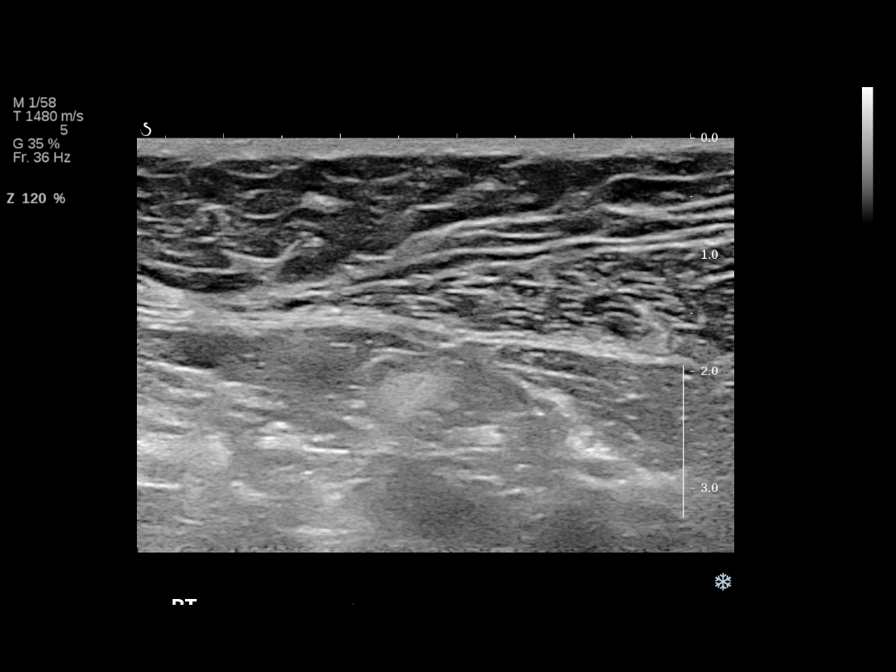
[im 3/8]
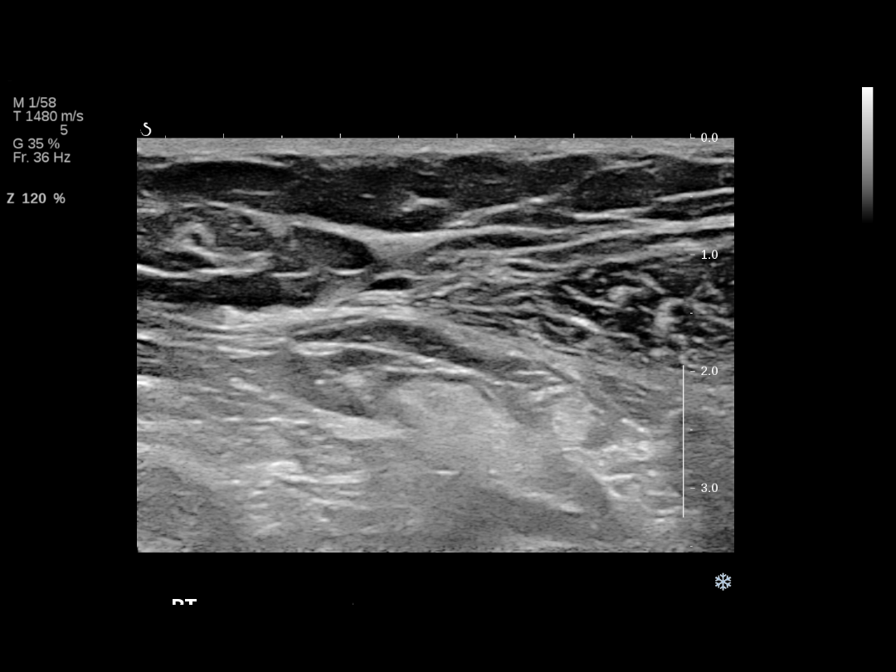
[im 4/8]
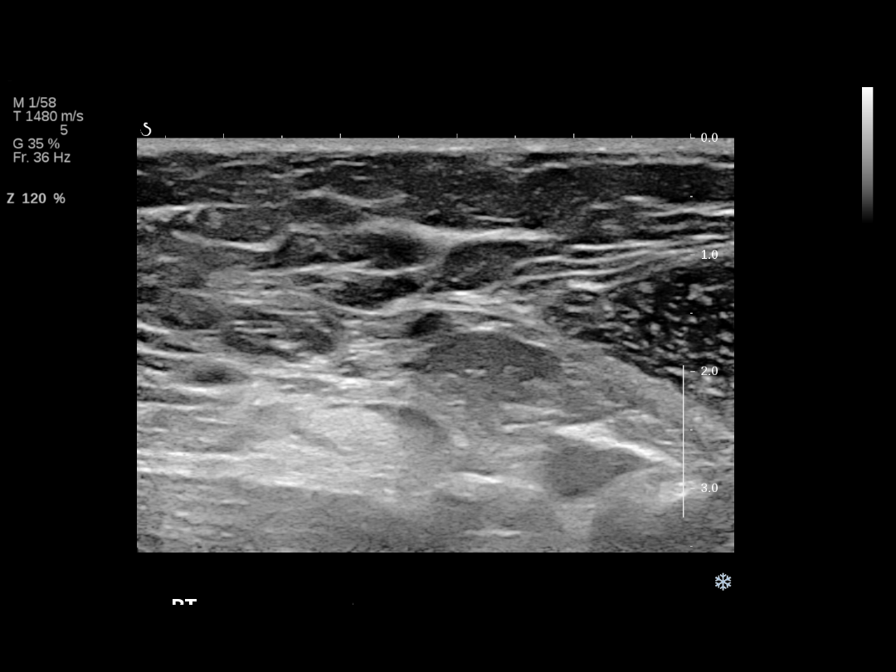
[im 5/8]
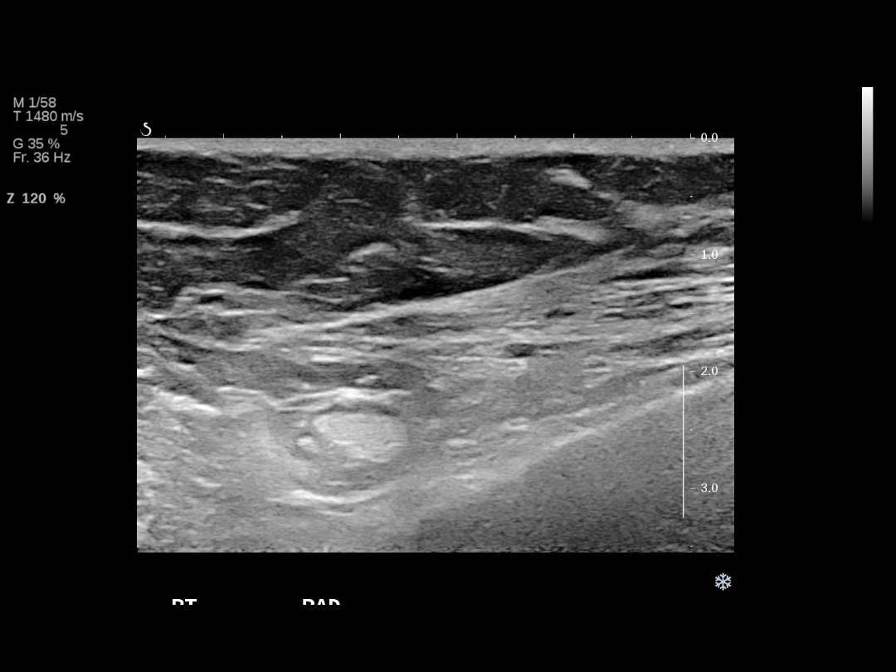
[im 6/8]
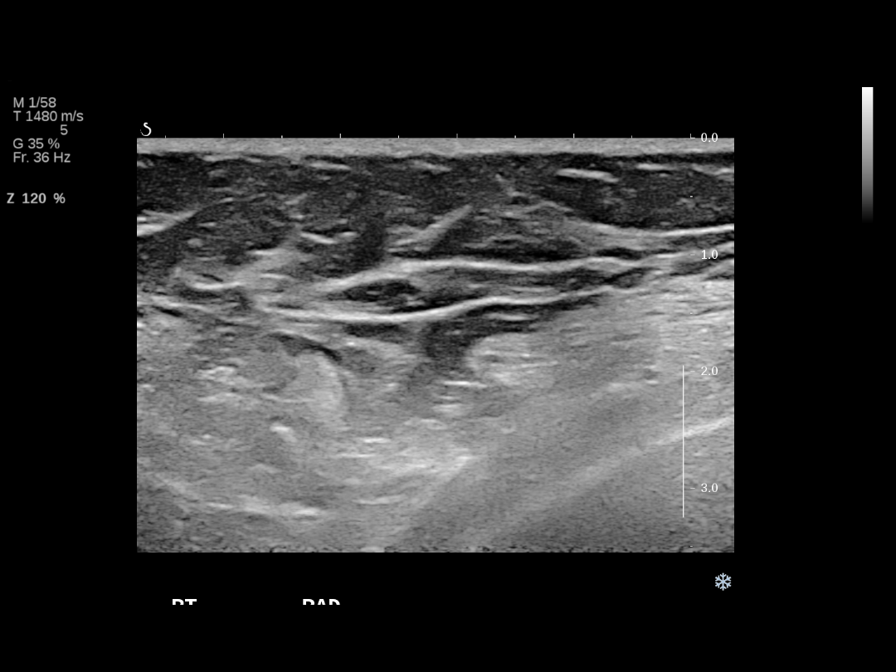
[im 7/8]
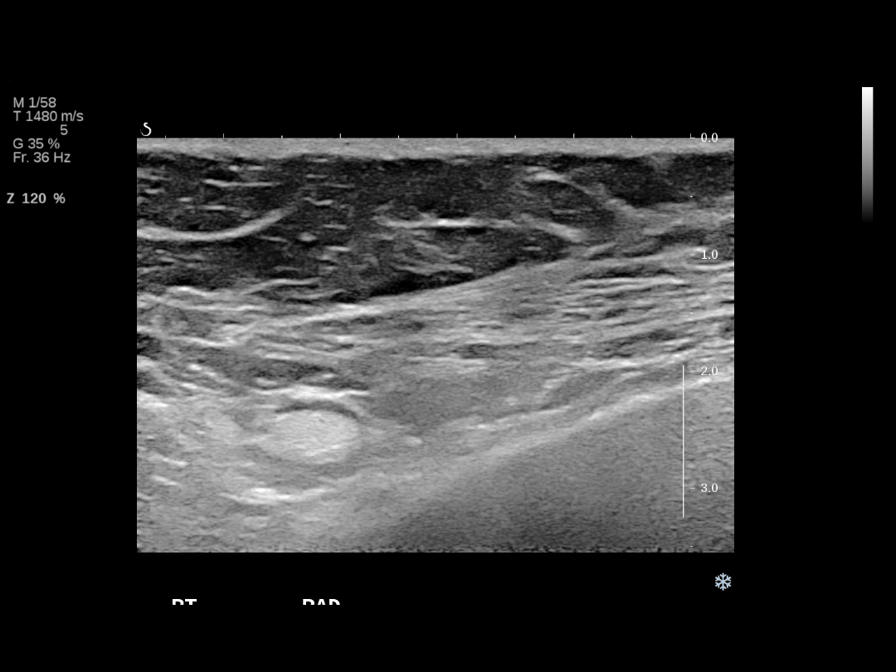
[im 8/8]
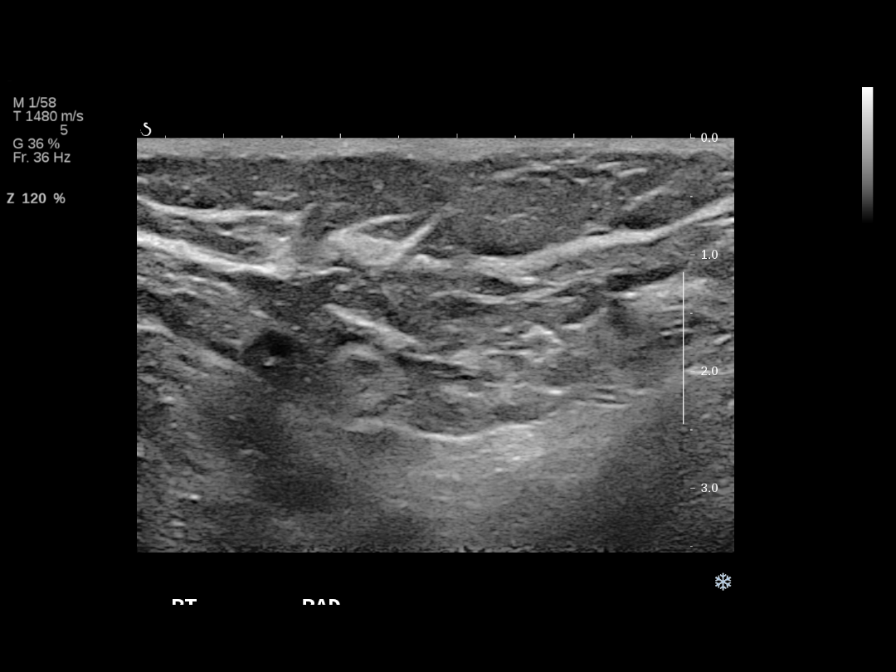

[8 of 8 positions shown; findings below may reference images not displayed]

ACR Breast Density Category c: The breast tissue is heterogeneously
dense, which may obscure small masses.
FINDINGS: 3D tomographic images of the right breast confirm an approximately
1.1 cm area of distortion in the posterior half of the breast
slightly medially and slightly superiorly. This contains multiple
small calcifications, some arranged in a somewhat linear fashion.

Spot magnification views of the right breast demonstrate scattered
microcalcifications in the breast that demonstrate dependent
layering in the true lateral projection, compatible with benign milk
of calcium.

Mammographic images were processed with CAD.

On physical exam, no mass is palpable in the upper inner right
breast or right axilla.

Targeted ultrasound is performed, showing normal appearing breast
tissue throughout the of medial right breast. Also demonstrated were
normal appearing right axillary lymph nodes.
IMPRESSION: 1.1 cm area of architectural distortion and calcifications in the
upper inner quadrant of the right breast. This is suspicious for
malignancy.

RECOMMENDATION:
Right breast 3D stereotactic guided core needle biopsy. This has
been discussed with the patient and scheduled at 11 a.m. on
[DATE].

I have discussed the findings and recommendations with the patient.
Results were also provided in writing at the conclusion of the
visit. If applicable, a reminder letter will be sent to the patient
regarding the next appointment.

BI-RADS CATEGORY  4: Suspicious.

## 2015-02-28 ENCOUNTER — Other Ambulatory Visit: Payer: Self-pay | Admitting: Obstetrics and Gynecology

## 2015-02-28 ENCOUNTER — Ambulatory Visit
Admission: RE | Admit: 2015-02-28 | Discharge: 2015-02-28 | Disposition: A | Discharge status: Home / Self Care | Payer: BLUE CROSS/BLUE SHIELD | Source: Ambulatory Visit | Attending: Obstetrics and Gynecology | Admitting: Obstetrics and Gynecology

## 2015-02-28 DIAGNOSIS — N6310 Unspecified lump in the right breast, unspecified quadrant: Secondary | ICD-10-CM

## 2015-02-28 HISTORY — PX: BREAST BIOPSY: SHX20

## 2015-03-15 ENCOUNTER — Other Ambulatory Visit: Payer: Self-pay | Admitting: General Surgery

## 2015-03-15 DIAGNOSIS — N6489 Other specified disorders of breast: Secondary | ICD-10-CM

## 2015-03-29 ENCOUNTER — Ambulatory Visit
Admission: RE | Admit: 2015-03-29 | Discharge: 2015-03-29 | Disposition: A | Discharge status: Home / Self Care | Payer: BLUE CROSS/BLUE SHIELD | Source: Ambulatory Visit | Attending: General Surgery | Admitting: General Surgery

## 2015-03-29 ENCOUNTER — Encounter (HOSPITAL_BASED_OUTPATIENT_CLINIC_OR_DEPARTMENT_OTHER): Payer: Self-pay | Admitting: *Deleted

## 2015-03-29 DIAGNOSIS — N6489 Other specified disorders of breast: Secondary | ICD-10-CM

## 2015-03-29 HISTORY — PX: BREAST EXCISIONAL BIOPSY: SUR124

## 2015-04-02 ENCOUNTER — Encounter (HOSPITAL_BASED_OUTPATIENT_CLINIC_OR_DEPARTMENT_OTHER)
Admission: RE | Admit: 2015-04-02 | Discharge: 2015-04-02 | Disposition: A | Discharge status: Home / Self Care | Payer: BLUE CROSS/BLUE SHIELD | Source: Ambulatory Visit | Attending: General Surgery | Admitting: General Surgery

## 2015-04-02 DIAGNOSIS — C50911 Malignant neoplasm of unspecified site of right female breast: Secondary | ICD-10-CM | POA: Diagnosis present

## 2015-04-02 DIAGNOSIS — N6021 Fibroadenosis of right breast: Secondary | ICD-10-CM | POA: Diagnosis not present

## 2015-04-02 LAB — CBC WITH DIFFERENTIAL/PLATELET
BASOS ABS: 0 10*3/uL (ref 0.0–0.1)
Basophils Relative: 1 %
EOS PCT: 3 %
Eosinophils Absolute: 0.2 10*3/uL (ref 0.0–0.7)
HCT: 41 % (ref 36.0–46.0)
Hemoglobin: 13.4 g/dL (ref 12.0–15.0)
LYMPHS PCT: 29 %
Lymphs Abs: 1.6 10*3/uL (ref 0.7–4.0)
MCH: 30.5 pg (ref 26.0–34.0)
MCHC: 32.7 g/dL (ref 30.0–36.0)
MCV: 93.2 fL (ref 78.0–100.0)
MONO ABS: 0.5 10*3/uL (ref 0.1–1.0)
Monocytes Relative: 9 %
Neutro Abs: 3.2 10*3/uL (ref 1.7–7.7)
Neutrophils Relative %: 58 %
PLATELETS: 221 10*3/uL (ref 150–400)
RBC: 4.4 MIL/uL (ref 3.87–5.11)
RDW: 12.4 % (ref 11.5–15.5)
WBC: 5.4 10*3/uL (ref 4.0–10.5)

## 2015-04-02 LAB — BASIC METABOLIC PANEL
Anion gap: 8 (ref 5–15)
BUN: 9 mg/dL (ref 6–20)
CALCIUM: 9.1 mg/dL (ref 8.9–10.3)
CO2: 29 mmol/L (ref 22–32)
CREATININE: 0.57 mg/dL (ref 0.44–1.00)
Chloride: 104 mmol/L (ref 101–111)
GFR calc Af Amer: >60 mL/min (ref >60)
GLUCOSE: 94 mg/dL (ref 65–99)
Potassium: 4.8 mmol/L (ref 3.5–5.1)
Sodium: 141 mmol/L (ref 135–145)

## 2015-04-03 ENCOUNTER — Encounter (HOSPITAL_BASED_OUTPATIENT_CLINIC_OR_DEPARTMENT_OTHER): Payer: Self-pay | Admitting: *Deleted

## 2015-04-03 ENCOUNTER — Ambulatory Visit (HOSPITAL_BASED_OUTPATIENT_CLINIC_OR_DEPARTMENT_OTHER): Payer: BLUE CROSS/BLUE SHIELD | Admitting: Anesthesiology

## 2015-04-03 ENCOUNTER — Encounter (HOSPITAL_BASED_OUTPATIENT_CLINIC_OR_DEPARTMENT_OTHER)
Admission: RE | Disposition: A | Discharge status: Home / Self Care | Payer: Self-pay | Source: Ambulatory Visit | Attending: General Surgery

## 2015-04-03 ENCOUNTER — Ambulatory Visit
Admission: RE | Admit: 2015-04-03 | Discharge: 2015-04-03 | Disposition: A | Discharge status: Home / Self Care | Payer: BLUE CROSS/BLUE SHIELD | Source: Ambulatory Visit | Attending: General Surgery | Admitting: General Surgery

## 2015-04-03 ENCOUNTER — Ambulatory Visit (HOSPITAL_BASED_OUTPATIENT_CLINIC_OR_DEPARTMENT_OTHER)
Admission: RE | Admit: 2015-04-03 | Discharge: 2015-04-03 | Disposition: A | Discharge status: Home / Self Care | Payer: BLUE CROSS/BLUE SHIELD | Source: Ambulatory Visit | Attending: General Surgery | Admitting: General Surgery

## 2015-04-03 DIAGNOSIS — N6021 Fibroadenosis of right breast: Secondary | ICD-10-CM | POA: Insufficient documentation

## 2015-04-03 DIAGNOSIS — N6489 Other specified disorders of breast: Secondary | ICD-10-CM

## 2015-04-03 HISTORY — PX: RADIOACTIVE SEED GUIDED EXCISIONAL BREAST BIOPSY: SHX6490

## 2015-04-03 HISTORY — DX: Major depressive disorder, single episode, unspecified: F32.9

## 2015-04-03 HISTORY — DX: Hypothyroidism, unspecified: E03.9

## 2015-04-03 HISTORY — DX: Depression, unspecified: F32.A

## 2015-04-03 SURGERY — RADIOACTIVE SEED GUIDED BREAST BIOPSY
Anesthesia: General | Site: Breast | Laterality: Right

## 2015-04-03 MED ORDER — ONDANSETRON HCL 4 MG/2ML IJ SOLN
INTRAMUSCULAR | Status: AC
  Filled 2015-04-03: qty 2

## 2015-04-03 MED ORDER — PROMETHAZINE HCL 25 MG/ML IJ SOLN: 6.2500 mg | INTRAMUSCULAR | Status: DC | PRN

## 2015-04-03 MED ORDER — CEFAZOLIN SODIUM-DEXTROSE 2-3 GM-% IV SOLR
2.0000 g | INTRAVENOUS | Status: AC
  Administered 2015-04-03: 2 g via INTRAVENOUS

## 2015-04-03 MED ORDER — PROPOFOL 10 MG/ML IV BOLUS
INTRAVENOUS | Status: DC | PRN
  Administered 2015-04-03: 200 mg via INTRAVENOUS

## 2015-04-03 MED ORDER — HYDROMORPHONE HCL 1 MG/ML IJ SOLN
0.2500 mg | INTRAMUSCULAR | Status: DC | PRN
  Administered 2015-04-03: 0.5 mg via INTRAVENOUS

## 2015-04-03 MED ORDER — DEXAMETHASONE SODIUM PHOSPHATE 10 MG/ML IJ SOLN
INTRAMUSCULAR | Status: AC
  Filled 2015-04-03: qty 1

## 2015-04-03 MED ORDER — MIDAZOLAM HCL 2 MG/2ML IJ SOLN: 0.5000 mg | Freq: Once | INTRAMUSCULAR | Status: DC | PRN

## 2015-04-03 MED ORDER — LACTATED RINGERS IV SOLN
INTRAVENOUS | Status: DC
  Administered 2015-04-03: 10:00:00 via INTRAVENOUS

## 2015-04-03 MED ORDER — DEXAMETHASONE SODIUM PHOSPHATE 4 MG/ML IJ SOLN
INTRAMUSCULAR | Status: DC | PRN
  Administered 2015-04-03: 10 mg via INTRAVENOUS

## 2015-04-03 MED ORDER — MIDAZOLAM HCL 2 MG/2ML IJ SOLN
1.0000 mg | INTRAMUSCULAR | Status: DC | PRN
  Administered 2015-04-03: 2 mg via INTRAVENOUS

## 2015-04-03 MED ORDER — FENTANYL CITRATE (PF) 100 MCG/2ML IJ SOLN
INTRAMUSCULAR | Status: AC
  Filled 2015-04-03: qty 4

## 2015-04-03 MED ORDER — LIDOCAINE HCL (CARDIAC) 20 MG/ML IV SOLN
INTRAVENOUS | Status: AC
  Filled 2015-04-03: qty 5

## 2015-04-03 MED ORDER — FENTANYL CITRATE (PF) 100 MCG/2ML IJ SOLN
50.0000 ug | INTRAMUSCULAR | Status: DC | PRN
  Administered 2015-04-03: 100 ug via INTRAVENOUS

## 2015-04-03 MED ORDER — MIDAZOLAM HCL 2 MG/2ML IJ SOLN
INTRAMUSCULAR | Status: AC
  Filled 2015-04-03: qty 4

## 2015-04-03 MED ORDER — GLYCOPYRROLATE 0.2 MG/ML IJ SOLN: 0.2000 mg | Freq: Once | INTRAMUSCULAR | Status: DC | PRN

## 2015-04-03 MED ORDER — BUPIVACAINE HCL (PF) 0.25 % IJ SOLN
INTRAMUSCULAR | Status: DC | PRN
  Administered 2015-04-03: 10 mL

## 2015-04-03 MED ORDER — SCOPOLAMINE 1 MG/3DAYS TD PT72: 1.0000 | MEDICATED_PATCH | Freq: Once | TRANSDERMAL | Status: DC | PRN

## 2015-04-03 MED ORDER — HYDROMORPHONE HCL 1 MG/ML IJ SOLN
INTRAMUSCULAR | Status: AC
  Filled 2015-04-03: qty 1

## 2015-04-03 MED ORDER — CEFAZOLIN SODIUM-DEXTROSE 2-3 GM-% IV SOLR
INTRAVENOUS | Status: AC
  Filled 2015-04-03: qty 50

## 2015-04-03 MED ORDER — MEPERIDINE HCL 25 MG/ML IJ SOLN: 6.2500 mg | INTRAMUSCULAR | Status: DC | PRN

## 2015-04-03 MED ORDER — LIDOCAINE HCL (CARDIAC) 20 MG/ML IV SOLN
INTRAVENOUS | Status: DC | PRN
  Administered 2015-04-03: 100 mg via INTRAVENOUS

## 2015-04-03 MED ORDER — HYDROCODONE-ACETAMINOPHEN 10-325 MG PO TABS: 1.0000 | ORAL_TABLET | Freq: Four times a day (QID) | ORAL | Status: AC | PRN

## 2015-04-03 SURGICAL SUPPLY — 60 items
APPLIER CLIP 9.375 MED OPEN (MISCELLANEOUS)
APR CLP MED 9.3 20 MLT OPN (MISCELLANEOUS)
BINDER BREAST LRG (GAUZE/BANDAGES/DRESSINGS) ×1 IMPLANT
BINDER BREAST MEDIUM (GAUZE/BANDAGES/DRESSINGS) IMPLANT
BINDER BREAST XLRG (GAUZE/BANDAGES/DRESSINGS) IMPLANT
BINDER BREAST XXLRG (GAUZE/BANDAGES/DRESSINGS) IMPLANT
BLADE SURG 15 STRL LF DISP TIS (BLADE) ×1 IMPLANT
BLADE SURG 15 STRL SS (BLADE) ×2
CANISTER SUC SOCK COL 7IN (MISCELLANEOUS) IMPLANT
CANISTER SUCT 1200ML W/VALVE (MISCELLANEOUS) IMPLANT
CHLORAPREP W/TINT 26ML (MISCELLANEOUS) ×2 IMPLANT
CLIP APPLIE 9.375 MED OPEN (MISCELLANEOUS) IMPLANT
COVER BACK TABLE 60X90IN (DRAPES) ×2 IMPLANT
COVER MAYO STAND STRL (DRAPES) ×2 IMPLANT
COVER PROBE W GEL 5X96 (DRAPES) ×2 IMPLANT
DECANTER SPIKE VIAL GLASS SM (MISCELLANEOUS) IMPLANT
DEVICE DUBIN W/COMP PLATE 8390 (MISCELLANEOUS) ×2 IMPLANT
DRAPE LAPAROSCOPIC ABDOMINAL (DRAPES) ×2 IMPLANT
DRSG TEGADERM 4X4.75 (GAUZE/BANDAGES/DRESSINGS) IMPLANT
ELECT COATED BLADE 2.86 ST (ELECTRODE) ×2 IMPLANT
ELECT REM PT RETURN 9FT ADLT (ELECTROSURGICAL) ×2
ELECTRODE REM PT RTRN 9FT ADLT (ELECTROSURGICAL) ×1 IMPLANT
GLOVE BIO SURGEON STRL SZ 6.5 (GLOVE) ×1 IMPLANT
GLOVE BIO SURGEON STRL SZ7 (GLOVE) ×4 IMPLANT
GLOVE BIOGEL PI IND STRL 7.0 (GLOVE) IMPLANT
GLOVE BIOGEL PI IND STRL 7.5 (GLOVE) ×1 IMPLANT
GLOVE BIOGEL PI INDICATOR 7.0 (GLOVE) ×1
GLOVE BIOGEL PI INDICATOR 7.5 (GLOVE) ×1
GLOVE ECLIPSE 6.5 STRL STRAW (GLOVE) ×2 IMPLANT
GOWN STRL REUS W/ TWL LRG LVL3 (GOWN DISPOSABLE) ×2 IMPLANT
GOWN STRL REUS W/TWL LRG LVL3 (GOWN DISPOSABLE) ×4
ILLUMINATOR WAVEGUIDE N/F (MISCELLANEOUS) IMPLANT
KIT MARKER MARGIN INK (KITS) ×2 IMPLANT
LIGHT WAVEGUIDE WIDE FLAT (MISCELLANEOUS) IMPLANT
LIQUID BAND (GAUZE/BANDAGES/DRESSINGS) ×2 IMPLANT
MARKER SKIN DUAL TIP RULER LAB (MISCELLANEOUS) ×2 IMPLANT
NDL HYPO 25X1 1.5 SAFETY (NEEDLE) ×1 IMPLANT
NEEDLE HYPO 25X1 1.5 SAFETY (NEEDLE) ×2 IMPLANT
NS IRRIG 1000ML POUR BTL (IV SOLUTION) IMPLANT
PACK BASIN DAY SURGERY FS (CUSTOM PROCEDURE TRAY) ×2 IMPLANT
PENCIL BUTTON HOLSTER BLD 10FT (ELECTRODE) ×2 IMPLANT
SHEET MEDIUM DRAPE 40X70 STRL (DRAPES) IMPLANT
SLEEVE SCD COMPRESS KNEE MED (MISCELLANEOUS) ×2 IMPLANT
SPONGE GAUZE 4X4 12PLY STER LF (GAUZE/BANDAGES/DRESSINGS) IMPLANT
SPONGE LAP 4X18 X RAY DECT (DISPOSABLE) ×2 IMPLANT
STAPLER VISISTAT 35W (STAPLE) IMPLANT
STRIP CLOSURE SKIN 1/2X4 (GAUZE/BANDAGES/DRESSINGS) ×2 IMPLANT
SUT MNCRL AB 4-0 PS2 18 (SUTURE) IMPLANT
SUT MON AB 5-0 PS2 18 (SUTURE) IMPLANT
SUT SILK 2 0 SH (SUTURE) IMPLANT
SUT VIC AB 2-0 SH 27 (SUTURE) ×2
SUT VIC AB 2-0 SH 27XBRD (SUTURE) ×1 IMPLANT
SUT VIC AB 3-0 SH 27 (SUTURE) ×2
SUT VIC AB 3-0 SH 27X BRD (SUTURE) ×1 IMPLANT
SUT VIC AB 5-0 PS2 18 (SUTURE) IMPLANT
SYR CONTROL 10ML LL (SYRINGE) ×2 IMPLANT
TOWEL OR 17X24 6PK STRL BLUE (TOWEL DISPOSABLE) ×2 IMPLANT
TOWEL OR NON WOVEN STRL DISP B (DISPOSABLE) ×2 IMPLANT
TUBE CONNECTING 20X1/4 (TUBING) IMPLANT
YANKAUER SUCT BULB TIP NO VENT (SUCTIONS) IMPLANT

## 2015-04-03 NOTE — Anesthesia Preprocedure Evaluation (Addendum)
Anesthesia Evaluation  Patient identified by MRN, date of birth, ID band Patient awake    Reviewed: Allergy & Precautions, NPO status , Patient's Chart, lab work & pertinent test results  History of Anesthesia Complications Negative for: history of anesthetic complications  Airway Mallampati: I  TM Distance: >3 FB Neck ROM: Full    Dental  (+) Dental Advisory Given   Pulmonary former smoker,    breath sounds clear to auscultation       Cardiovascular (-) anginanegative cardio ROS   Rhythm:Regular Rate:Normal     Neuro/Psych Depression negative neurological ROS     GI/Hepatic negative GI ROS, Neg liver ROS,   Endo/Other  Hypothyroidism   Renal/GU negative Renal ROS     Musculoskeletal   Abdominal   Peds  Hematology negative hematology ROS (+)   Anesthesia Other Findings   Reproductive/Obstetrics LMP 03/29/15                            Anesthesia Physical Anesthesia Plan  ASA: II  Anesthesia Plan: General   Post-op Pain Management:    Induction: Intravenous  Airway Management Planned: LMA  Additional Equipment:   Intra-op Plan:   Post-operative Plan:   Informed Consent: I have reviewed the patients History and Physical, chart, labs and discussed the procedure including the risks, benefits and alternatives for the proposed anesthesia with the patient or authorized representative who has indicated his/her understanding and acceptance.   Dental advisory given  Plan Discussed with: CRNA and Surgeon  Anesthesia Plan Comments: (Plan routine monitors, GA-LMA OK)        Anesthesia Quick Evaluation

## 2015-04-03 NOTE — Interval H&P Note (Signed)
History and Physical Interval Note:  04/03/2015 11:09 AM  Tara Yates  has presented today for surgery, with the diagnosis of RIGHT BREAST MASS  The various methods of treatment have been discussed with the patient and family. After consideration of risks, benefits and other options for treatment, the patient has consented to  Procedure(s): RADIOACTIVE SEED GUIDED EXCISIONAL BREAST BIOPSY (Right) as a surgical intervention .  The patient's history has been reviewed, patient examined, no change in status, stable for surgery.  I have reviewed the patient's chart and labs.  Questions were answered to the patient's satisfaction.     WAKEFIELD,MATTHEW

## 2015-04-03 NOTE — Transfer of Care (Signed)
Immediate Anesthesia Transfer of Care Note  Patient: Tara Yates  Procedure(s) Performed: Procedure(s): RADIOACTIVE SEED GUIDED EXCISIONAL BREAST BIOPSY (Right)  Patient Location: PACU  Anesthesia Type:General  Level of Consciousness: awake, sedated and patient cooperative  Airway & Oxygen Therapy: Patient Spontanous Breathing and Patient connected to face mask oxygen  Post-op Assessment: Report given to RN and Post -op Vital signs reviewed and stable  Post vital signs: Reviewed and stable  Last Vitals:  Filed Vitals:   04/03/15 1000  BP: 112/66  Pulse: 71  Temp: 36.8 C  Resp: 16    Complications: No apparent anesthesia complications

## 2015-04-03 NOTE — Anesthesia Procedure Notes (Signed)
Procedure Name: LMA Insertion Date/Time: 04/03/2015 11:25 AM Performed by: Lieutenant Diego Pre-anesthesia Checklist: Patient identified, Emergency Drugs available, Suction available and Patient being monitored Patient Re-evaluated:Patient Re-evaluated prior to inductionOxygen Delivery Method: Circle System Utilized Preoxygenation: Pre-oxygenation with 100% oxygen Intubation Type: IV induction Ventilation: Mask ventilation without difficulty LMA: LMA inserted LMA Size: 4.0 Number of attempts: 1 Airway Equipment and Method: Bite block Placement Confirmation: positive ETCO2 and breath sounds checked- equal and bilateral Tube secured with: Tape Dental Injury: Teeth and Oropharynx as per pre-operative assessment

## 2015-04-03 NOTE — Discharge Instructions (Signed)
[place] Office Phone Number [phone]  POST OP INSTRUCTIONS  Always review your discharge instruction sheet given to you by the facility where your surgery was performed.  IF YOU HAVE DISABILITY OR FAMILY LEAVE FORMS, YOU MUST BRING THEM TO THE OFFICE FOR PROCESSING.  DO NOT GIVE THEM TO YOUR DOCTOR.  1. A prescription for pain medication may be given to you upon discharge.  Take your pain medication as prescribed, if needed.  If narcotic pain medicine is not needed, then you may take acetaminophen (Tylenol), naprosyn (Alleve) or ibuprofen (Advil) as needed. 2. Take your usually prescribed medications unless otherwise directed 3. If you need a refill on your pain medication, please contact your pharmacy.  They will contact our office to request authorization.  Prescriptions will not be filled after 5pm or on week-ends. 4. You should eat very light the first 24 hours after surgery, such as soup, crackers, pudding, etc.  Resume your normal diet the day after surgery. 5. Most patients will experience some swelling and bruising in the breast.  Ice packs and a good support bra will help.  Wear the breast binder provided or a sports bra for 72 hours day and night.  After that wear a sports bra during the day until you return to the office. Swelling and bruising can take several days to resolve.  6. It is common to experience some constipation if taking pain medication after surgery.  Increasing fluid intake and taking a stool softener will usually help or prevent this problem from occurring.  A mild laxative (Milk of Magnesia or Miralax) should be taken according to package directions if there are no bowel movements after 48 hours. 7. Unless discharge instructions indicate otherwise, you may remove your bandages 48 hours after surgery and you may shower at that time.  You may have steri-strips (small skin tapes) in place directly over the incision.  These strips should be left on the  skin for 7-10 days and will come off on their own.  If your surgeon used skin glue on the incision, you may shower in 24 hours.  The glue will flake off over the next 2-3 weeks.  Any sutures or staples will be removed at the office during your follow-up visit. 8. ACTIVITIES:  You may resume regular daily activities (gradually increasing) beginning the next day.  Wearing a good support bra or sports bra minimizes pain and swelling.  You may have sexual intercourse when it is comfortable. a. You may drive when you no longer are taking prescription pain medication, you can comfortably wear a seatbelt, and you can safely maneuver your car and apply brakes. b. RETURN TO WORK:  ______________________________________________________________________________________ 9. You should see your doctor in the office for a follow-up appointment approximately two weeks after your surgery.  Your doctors nurse will typically make your follow-up appointment when she calls you with your pathology report.  Expect your pathology report 3-4 business days after your surgery.  You may call to check if you do not hear from [place] after three days. OTHER INSTRUCTIONS:   WHEN TO CALL DR WAKEFIELD: 1. Fever over 101.0 2. Nausea and/or vomiting. 3. Extreme swelling or bruising. 4. Continued bleeding from incision. 5. Increased pain, redness, or drainage from the incision.  The clinic staff is available to answer your questions during regular business hours.  Please dont hesitate to call and ask to speak to one of the nurses for clinical concerns.  If you have a medical emergency,  go to the nearest emergency room or call 911.  A surgeon from [facility] is always on call at the hospital.  For further questions, please visit [email or web address]    Post Anesthesia Home Care Instructions  Activity: Get plenty of rest for the remainder of the day. A responsible adult should stay with you for 24 hours  following the procedure.  For the next 24 hours, DO NOT: -Drive a car -Paediatric nurse -Drink alcoholic beverages -Take any medication unless instructed by your physician -Make any legal decisions or sign important papers.  Meals: Start with liquid foods such as gelatin or soup. Progress to regular foods as tolerated. Avoid greasy, spicy, heavy foods. If nausea and/or vomiting occur, drink only clear liquids until the nausea and/or vomiting subsides. Call your physician if vomiting continues.  Special Instructions/Symptoms: Your throat may feel dry or sore from the anesthesia or the breathing tube placed in your throat during surgery. If this causes discomfort, gargle with warm salt water. The discomfort should disappear within 24 hours.  If you had a scopolamine patch placed behind your ear for the management of post- operative nausea and/or vomiting:  1. The medication in the patch is effective for 72 hours, after which it should be removed.  Wrap patch in a tissue and discard in the trash. Wash hands thoroughly with soap and water. 2. You may remove the patch earlier than 72 hours if you experience unpleasant side effects which may include dry mouth, dizziness or visual disturbances. 3. Avoid touching the patch. Wash your hands with soap and water after contact with the patch.

## 2015-04-03 NOTE — Op Note (Signed)
Preoperative diagnosis: right breast CSL Postoperative diagnosis: same as above Procedure: right breast radioactive seed guided excisional biopsy Surgeon: Dr Serita Grammes Anesthesia: general EBL: minimal Drains none Complications none Specimen right breast tissue marked with paint Sponge and needle count was correct to completion Suspicion to recovery stable  Indications: This is 54 yof with right mammographic mass that on core biopsy is csl.  We discussed rsl excision of the mass. The seed was placed prior to beginning. I had the mammograms in the OR  Procedure: After informed consent was obtained the patient was taken to the operating room. She was given antibiotics. Sequential compression devices were on her legs. She was then placed under general anesthesia without complication. She was prepped and draped in the standard sterile surgical fashion. A surgical timeout was then performed.  I infiltrated quarter percent Marcaine around the areola. I then made a periareolar incision. I then used the neoprobe to identify the radioactive seed and then remove this. I removed a small portion of hard tissue as well.   Hemostasis was observed. I painted the specimen. A mammogram was taken confirming removal of the clip in the smaller specimen which was a couple centimeters from the seed. The seed was in the main specimen. There was no radioactivity remaining in the breast. I then closed this with 2-0 Vicryl, 3-0 Vicryl, and 5-0 Monocryl. Glue was placed over the wound. A breast binder was placed. She tolerated this well was extubated and transferred to recovery in stable condition

## 2015-04-03 NOTE — H&P (Signed)
  49 yof who presents after undergoing six month follow up for right breast lesion. This persisted and was a 1.1 cm mass on latest tomo mm. this underwent core biopsy with clip placement. path is a csl. she has no complaints referable to either breast. no masses/ no discharge. she has fh in pgm at age 49. no prior breast history.   Other Problems Elbert Ewings, CMA; 03/15/2015 9:03 AM) Back Pain Thyroid Disease  Past Surgical History Elbert Ewings, [place]; 03/15/2015 9:03 AM) Oral Surgery  Diagnostic Studies History Elbert Ewings, [place]; 03/15/2015 9:03 AM) Colonoscopy never Mammogram within last year Pap Smear 1-5 years ago  Allergies Elbert Ewings, CMA; 03/15/2015 9:03 AM) No Known Drug Allergies09/02/2015  Medication History Elbert Ewings, CMA; 03/15/2015 9:04 AM) Gabapentin (300MG  Capsule, Oral) Active. ValACYclovir HCl (1GM Tablet, Oral) Active. Levothyroxine Sodium (50MCG Tablet, Oral) Active. Multiple Vitamin (Oral) Active. Medications Reconciled  Social History Elbert Ewings, [place]; 03/15/2015 9:03 AM) Alcohol use Occasional alcohol use. Caffeine use Coffee. No drug use Tobacco use Never smoker.  Family History Elbert Ewings, [place]; 03/15/2015 9:03 AM) Heart Disease Mother. Heart disease in female family member before age 22  Pregnancy / Birth History Elbert Ewings, [place]; 03/15/2015 9:03 AM) Age at menarche 31 years. Contraceptive History Oral contraceptives. Gravida 5 Maternal age 33-25 Para 2 Regular periods  Review of Systems Elbert Ewings CMA; 03/15/2015 9:03 AM) General Not Present- Appetite Loss, Chills, Fatigue, Fever, Night Sweats, Weight Gain and Weight Loss. Skin Not Present- Change in Wart/Mole, Dryness, Hives, Jaundice, New Lesions, Non-Healing Wounds, Rash and Ulcer. HEENT Not Present- Earache, Hearing Loss, Hoarseness, Nose Bleed, Oral Ulcers, Ringing in the Ears, Seasonal Allergies, Sinus Pain, Sore Throat, Visual Disturbances, Wears glasses/contact lenses  and Yellow Eyes. Respiratory Not Present- Bloody sputum, Chronic Cough, Difficulty Breathing, Snoring and Wheezing. Cardiovascular Not Present- Chest Pain, Difficulty Breathing Lying Down, Leg Cramps, Palpitations, Rapid Heart Rate, Shortness of Breath and Swelling of Extremities. Gastrointestinal Present- Constipation. Not Present- Abdominal Pain, Bloating, Bloody Stool, Change in Bowel Habits, Chronic diarrhea, Difficulty Swallowing, Excessive gas, Gets full quickly at meals, Hemorrhoids, Indigestion, Nausea, Rectal Pain and Vomiting. Female Genitourinary Not Present- Frequency, Nocturia, Painful Urination, Pelvic Pain and Urgency. Musculoskeletal Present- Back Pain. Not Present- Joint Pain, Joint Stiffness, Muscle Pain, Muscle Weakness and Swelling of Extremities. Neurological Not Present- Decreased Memory, Fainting, Headaches, Numbness, Seizures, Tingling, Tremor, Trouble walking and Weakness. Psychiatric Not Present- Anxiety, Bipolar, Change in Sleep Pattern, Depression, Fearful and Frequent crying. Endocrine Not Present- Cold Intolerance, Excessive Hunger, Hair Changes, Heat Intolerance, Hot flashes and New Diabetes. Hematology Not Present- Easy Bruising, Excessive bleeding, Gland problems, HIV and Persistent Infections.   Vitals Elbert Ewings CMA; 03/15/2015 9:04 AM) 03/15/2015 9:04 AM Weight: 164 lb Height: 64in Body Surface Area: 1.83 m Body Mass Index: 28.15 kg/m Temp.: 97.43F(Temporal)  Pulse: 67 (Regular)  BP: 124/74 (Sitting, Left Arm, Standard)    Physical Exam Rolm Bookbinder MD; 03/15/2015 10:26 AM) General Mental Status-Alert. Orientation-Oriented X3.  Breast Nipples Discharge - Bilateral - None. Breast Lump-No Palpable Breast Mass.  Lymphatic Head & Neck  General Head & Neck Lymphatics: Bilateral - Description - Normal. Axillary  General Axillary Region: Bilateral - Description - Normal. Note: no [place] adenopathy     Assessment & Plan  Rolm Bookbinder MD; 03/15/2015 10:26 AM) RADIAL SCAR OF BREAST (N64.89) Story: right breast seed guided excisional biopsy discussed indications/risks/benefits of surgery. will proceed with right breast seed guided excisional biopsy

## 2015-04-03 NOTE — Anesthesia Postprocedure Evaluation (Signed)
  Anesthesia Post-op Note  Patient: Tara Yates  Procedure(s) Performed: Procedure(s): RADIOACTIVE SEED GUIDED EXCISIONAL BREAST BIOPSY (Right)  Patient Location: PACU  Anesthesia Type:General  Level of Consciousness: awake, alert , oriented and patient cooperative  Airway and Oxygen Therapy: Patient Spontanous Breathing  Post-op Pain: none  Post-op Assessment: Post-op Vital signs reviewed, Patient's Cardiovascular Status Stable, Respiratory Function Stable, Patent Airway, No signs of Nausea or vomiting and Pain level controlled              Post-op Vital Signs: Reviewed and stable  Last Vitals:  Filed Vitals:   04/03/15 1330  BP: 114/66  Pulse: 65  Temp: 36.5 C  Resp: 16    Complications: No apparent anesthesia complications

## 2015-04-04 ENCOUNTER — Encounter (HOSPITAL_BASED_OUTPATIENT_CLINIC_OR_DEPARTMENT_OTHER): Payer: Self-pay | Admitting: General Surgery

## 2016-04-14 DIAGNOSIS — F329 Major depressive disorder, single episode, unspecified: Secondary | ICD-10-CM | POA: Insufficient documentation

## 2016-04-14 DIAGNOSIS — F32A Depression, unspecified: Secondary | ICD-10-CM | POA: Insufficient documentation

## 2016-05-20 DIAGNOSIS — Z0000 Encounter for general adult medical examination without abnormal findings: Secondary | ICD-10-CM | POA: Insufficient documentation

## 2016-05-20 DIAGNOSIS — R232 Flushing: Secondary | ICD-10-CM | POA: Insufficient documentation

## 2016-10-01 DIAGNOSIS — F4321 Adjustment disorder with depressed mood: Secondary | ICD-10-CM | POA: Insufficient documentation

## 2016-10-14 ENCOUNTER — Other Ambulatory Visit: Payer: Self-pay | Admitting: Family Medicine

## 2016-10-14 DIAGNOSIS — Z1231 Encounter for screening mammogram for malignant neoplasm of breast: Secondary | ICD-10-CM

## 2016-11-03 ENCOUNTER — Ambulatory Visit
Admission: RE | Admit: 2016-11-03 | Discharge: 2016-11-03 | Disposition: A | Discharge status: Home / Self Care | Payer: BLUE CROSS/BLUE SHIELD | Source: Ambulatory Visit | Attending: Family Medicine | Admitting: Family Medicine

## 2016-11-03 DIAGNOSIS — Z1231 Encounter for screening mammogram for malignant neoplasm of breast: Secondary | ICD-10-CM

## 2018-03-01 DIAGNOSIS — Z0000 Encounter for general adult medical examination without abnormal findings: Secondary | ICD-10-CM | POA: Diagnosis not present

## 2018-03-01 DIAGNOSIS — Z23 Encounter for immunization: Secondary | ICD-10-CM | POA: Diagnosis not present

## 2018-03-01 DIAGNOSIS — R000 Tachycardia, unspecified: Secondary | ICD-10-CM | POA: Diagnosis not present

## 2018-03-01 DIAGNOSIS — E039 Hypothyroidism, unspecified: Secondary | ICD-10-CM | POA: Diagnosis not present

## 2018-03-03 ENCOUNTER — Other Ambulatory Visit: Payer: Self-pay | Admitting: Family Medicine

## 2018-03-03 DIAGNOSIS — Z1231 Encounter for screening mammogram for malignant neoplasm of breast: Secondary | ICD-10-CM

## 2018-03-19 DIAGNOSIS — E7800 Pure hypercholesterolemia, unspecified: Secondary | ICD-10-CM | POA: Diagnosis not present

## 2018-03-19 DIAGNOSIS — R002 Palpitations: Secondary | ICD-10-CM | POA: Insufficient documentation

## 2018-03-19 DIAGNOSIS — G4709 Other insomnia: Secondary | ICD-10-CM | POA: Diagnosis not present

## 2018-03-23 ENCOUNTER — Telehealth: Payer: Self-pay

## 2018-03-23 NOTE — Telephone Encounter (Signed)
SENT REFERRAL TO SCHEDULING AND FILE NOTES 

## 2018-03-30 ENCOUNTER — Telehealth: Payer: Self-pay

## 2018-03-30 NOTE — Telephone Encounter (Signed)
Sent referral to scheduling and filed notes 

## 2018-04-02 ENCOUNTER — Ambulatory Visit: Payer: BLUE CROSS/BLUE SHIELD

## 2018-04-15 ENCOUNTER — Encounter: Payer: Self-pay | Admitting: *Deleted

## 2018-04-29 ENCOUNTER — Ambulatory Visit
Admission: RE | Admit: 2018-04-29 | Discharge: 2018-04-29 | Disposition: A | Discharge status: Home / Self Care | Payer: 59 | Source: Ambulatory Visit | Attending: Family Medicine | Admitting: Family Medicine

## 2018-04-29 DIAGNOSIS — Z1231 Encounter for screening mammogram for malignant neoplasm of breast: Secondary | ICD-10-CM

## 2018-04-29 IMAGING — MG DIGITAL SCREENING BILATERAL MAMMOGRAM WITH TOMO AND CAD
8 series · 8 of 24 positions shown · non-contrast
Comparison: Previous exam(s).

CLINICAL DATA: Screening.

EXAM:
DIGITAL SCREENING BILATERAL MAMMOGRAM WITH TOMO AND CAD

[L CC synth-2D]
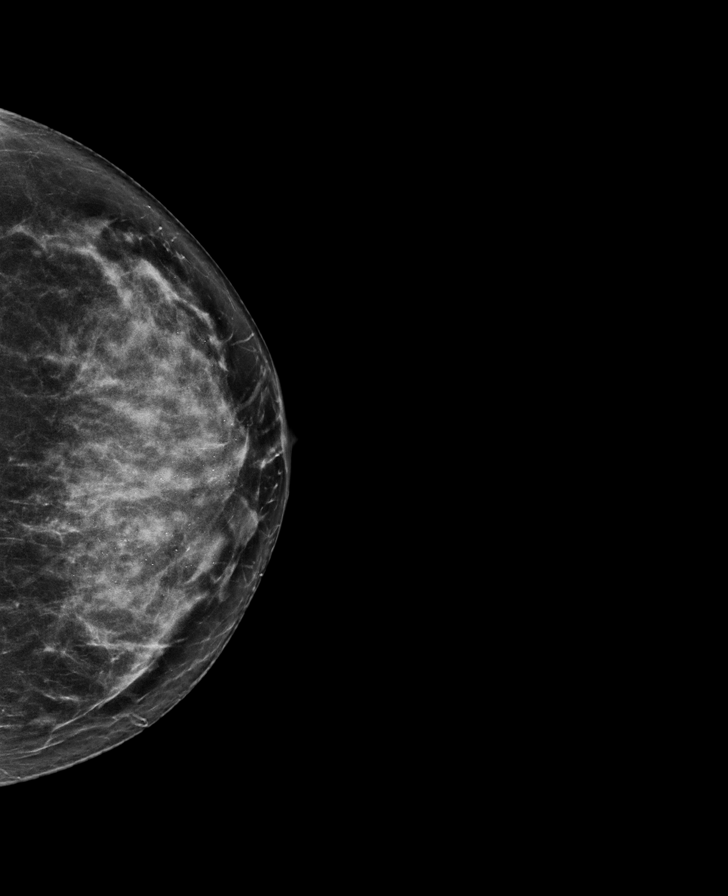

[R CC synth-2D]
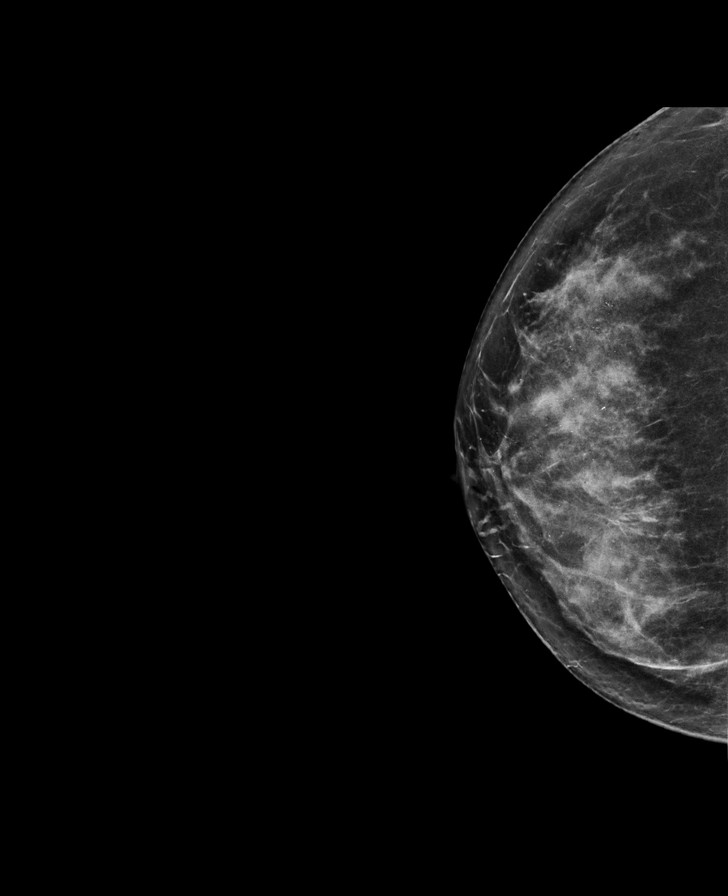

[R MLO synth-2D]
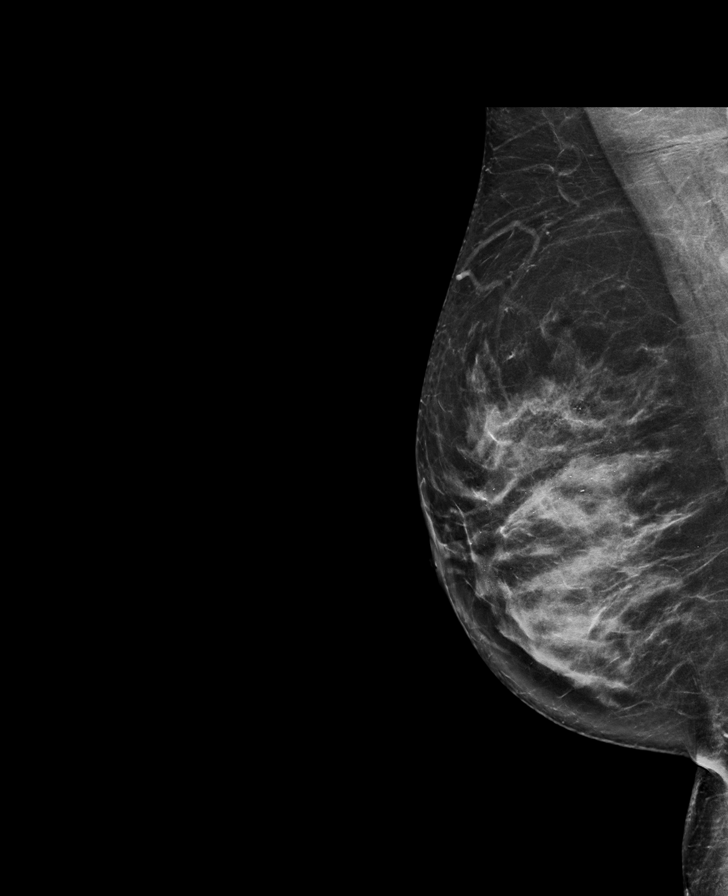

[L MLO synth-2D]
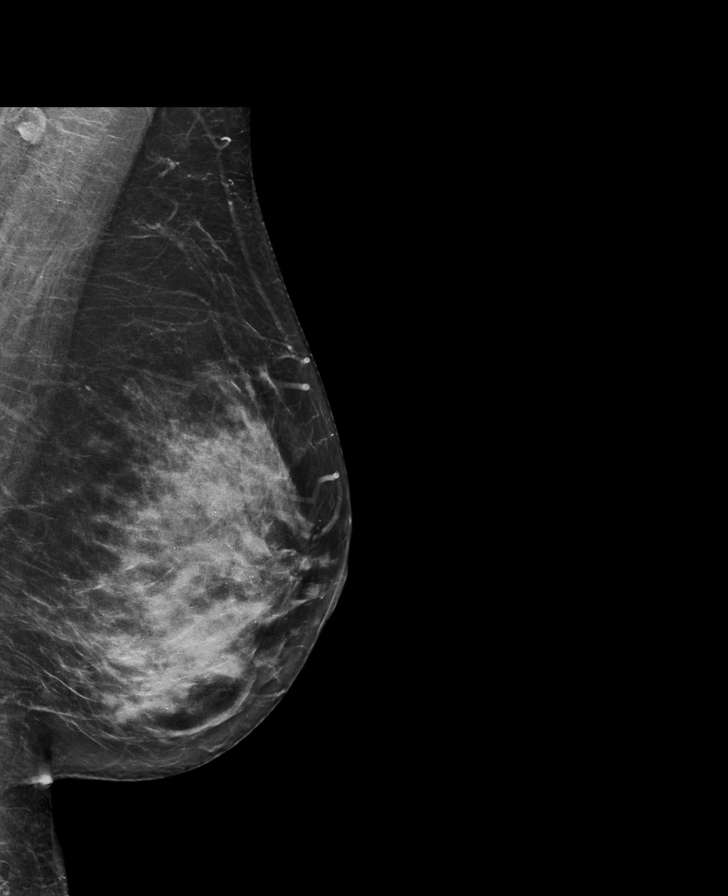

[R MLO tomo · tomo slice 37/74.0]
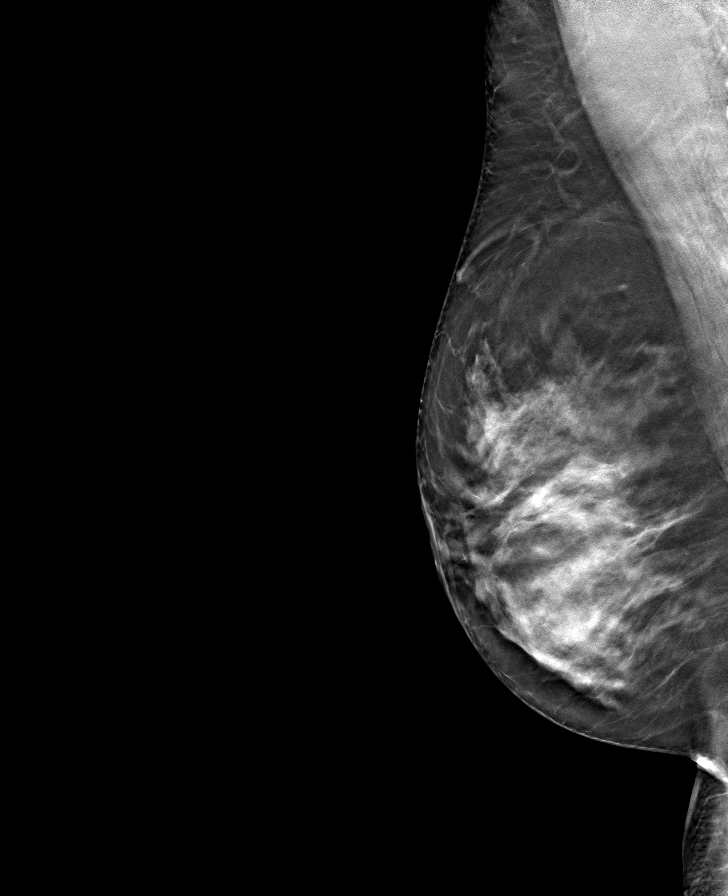

[L MLO tomo · tomo slice 41/80.0]
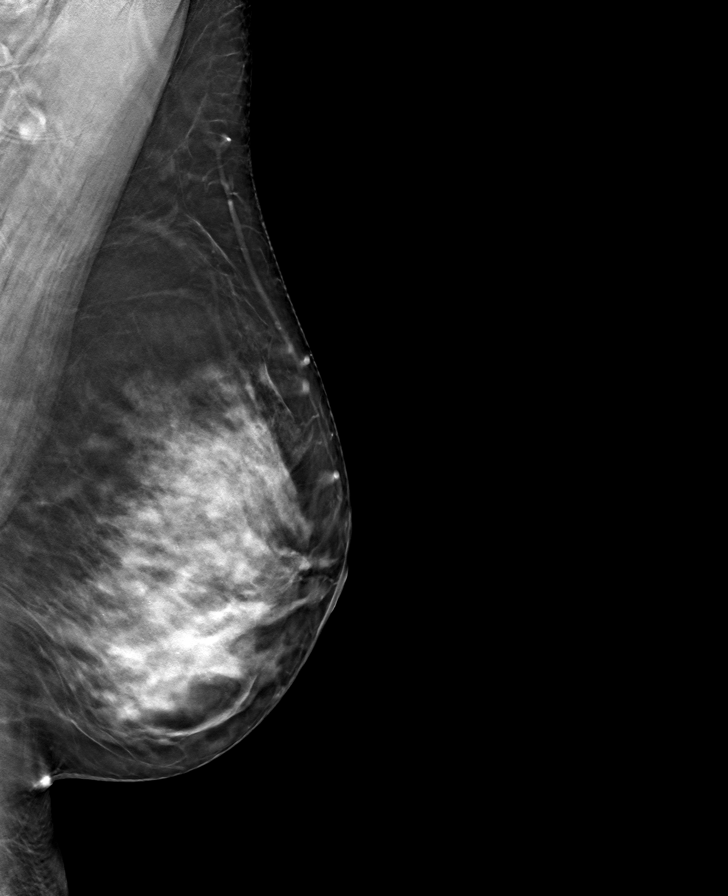

[L CC tomo · tomo slice 34/67.0]
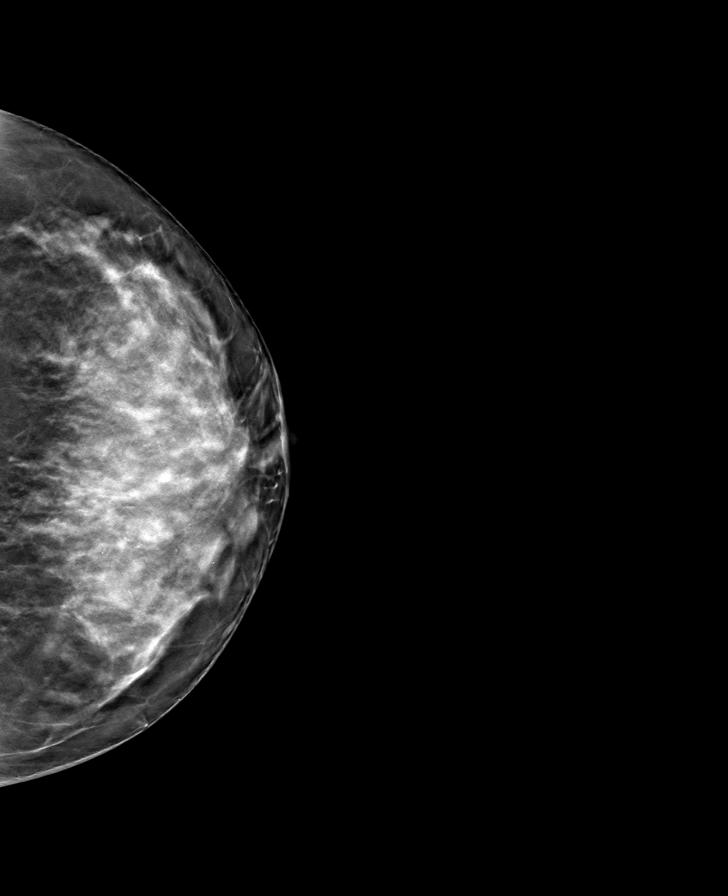

[R CC tomo · tomo slice 35/68.0]
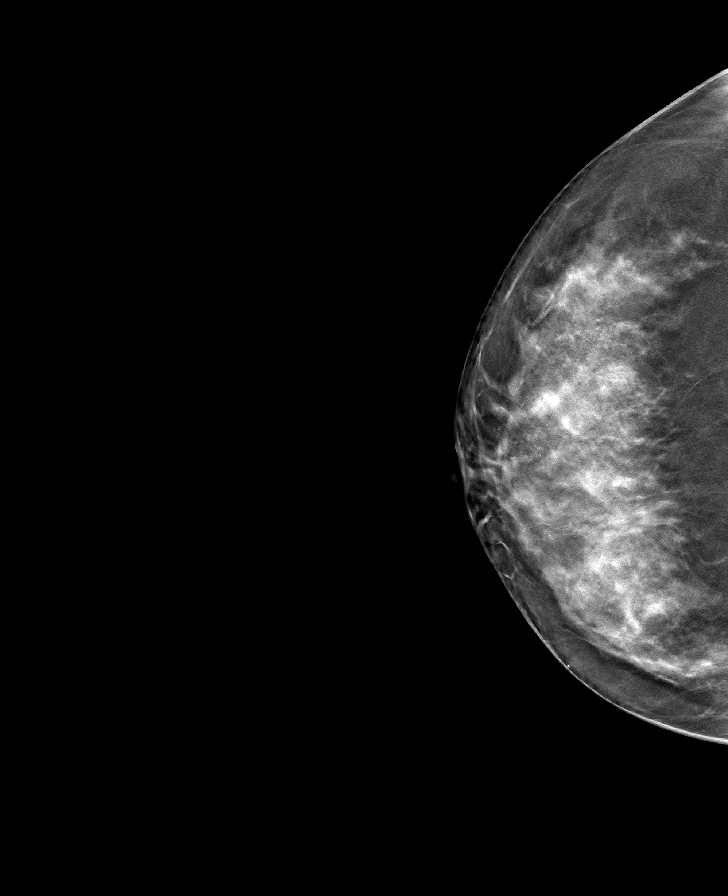

[8 of 24 positions shown; findings below may reference images not displayed]

ACR Breast Density Category c: The breast tissue is heterogeneously
dense, which may obscure small masses.
FINDINGS: There are no findings suspicious for malignancy. Images were
processed with CAD.
IMPRESSION: No mammographic evidence of malignancy. A result letter of this
screening mammogram will be mailed directly to the patient.

RECOMMENDATION:
Screening mammogram in one year. (Code:[5V])

BI-RADS CATEGORY  1: Negative.

## 2018-04-30 ENCOUNTER — Ambulatory Visit: Payer: Self-pay | Admitting: Cardiovascular Disease

## 2018-05-03 DIAGNOSIS — E039 Hypothyroidism, unspecified: Secondary | ICD-10-CM | POA: Diagnosis not present

## 2018-05-10 ENCOUNTER — Encounter: Payer: Self-pay | Admitting: Cardiology

## 2018-05-10 ENCOUNTER — Ambulatory Visit: Payer: 59 | Admitting: Cardiology

## 2018-05-10 VITALS — BP 130/68 | HR 60 | Ht 64.0 in | Wt 161.0 lb

## 2018-05-10 DIAGNOSIS — Z7189 Other specified counseling: Secondary | ICD-10-CM

## 2018-05-10 DIAGNOSIS — R002 Palpitations: Secondary | ICD-10-CM | POA: Diagnosis not present

## 2018-05-10 DIAGNOSIS — Z8249 Family history of ischemic heart disease and other diseases of the circulatory system: Secondary | ICD-10-CM | POA: Diagnosis not present

## 2018-05-10 DIAGNOSIS — E782 Mixed hyperlipidemia: Secondary | ICD-10-CM

## 2018-05-10 NOTE — Patient Instructions (Signed)
Medication Instructions:  Your Physician recommend you continue on your current medication as directed.    If you need a refill on your cardiac medications before your next appointment, please call your pharmacy.   Lab work: None   Testing/Procedures: Our physician has recommended that you wear an 7 DAY ZIO-PATCH monitor. The Zio patch cardiac monitor continuously records heart rhythm data for up to 14 days, this is for patients being evaluated for multiple types heart rhythms. For the first 24 hours post application, please avoid getting the Zio monitor wet in the shower or by excessive sweating during exercise. After that, feel free to carry on with regular activities. Keep soaps and lotions away from the ZIO XT Patch.   This will be placed at our [facility] location - [address], [address].      Follow-Up: At [facility], you and your health needs are our priority.  As part of our continuing mission to provide you with exceptional heart care, we have created designated Provider Care Teams.  These Care Teams include your primary Cardiologist (physician) and Advanced Practice Providers (APPs -  Physician Assistants and Nurse Practitioners) who all work together to provide you with the care you need, when you need it. You will need a follow up appointment in 6 weeks.  Please call our office 2 months in advance to schedule this appointment.  You may see Dr. Harrell Gave or one of the following Advanced Practice Providers on your designated Care Team:   Rosaria Ferries, PA-C . Jory Sims, DNP, ANP  Any Other Special Instructions Will Be Listed Below (If Applicable).

## 2018-05-10 NOTE — Progress Notes (Signed)
Cardiology Office Note:    Date:  05/10/2018   ID:  Tara Yates, DOB [birth year], MRN [id number]  PCP:  Robyne Peers, MD  Cardiologist:  Buford Dresser, MD PhD  Referring MD: Robyne Peers, MD   Chief Complaint  Patient presents with  . Follow-up  . Palpitations  . Headache  . Chest Pain    Tightness.    History of Present Illness:    Tara Yates is a 52 y.o. female with a hx of hypothyroidism who is seen as a new consult at the request of Robyne Peers, MD for the evaluation and management of palpitations. Notes and ECG from recent visit reviewed. ECG was NSR at that time.  Tachycardia/palpitations: -Initial onset: initially 2014/2015 incidentally on ECG. Only symptoms was when she was cheering for her kids at sporting events. It has remained but not bothersome until the last two months. -Frequency: daily/multiple times per day. -Duration: can wake from sleep, can last several hours -Triggers: stress, heavy exertion -Aggravating/alleviating factors: worse with exertion, better with exercise/walking -Syncope/near syncope: no -Prior cardiac history: seen by cardiology in 2014/2015, wore a heart monitor at that time -Prior ECG: sinus rhythm -Prior workup: monitor, no stress test/echo -Prior treatment: trialed on metoprolol, felt poorly, stopped taking -Possible medication interactions: on levothyroxine, lexapro -Caffeine: 1-1.5 c coffee/day -Alcohol: 1-2 glasses wine/night -Tobacco: former, quit when she was 32 -OTC supplements: amberen (for perimenopausal) -Comorbidities: none -Exercise level: jogs 3 times/week -Cardiac ROS: no chest pain, no shortness of breath, no PND, no orthopnea, no LE edema. -Family history: mother had first MI at age 66, high cholesterol, had 3V CABG at age 10. Had CHF at age 25, passed shortly after. Also has scleroderma, Raynaud's, multiple sclerosis, other autoimmune issues. Died in her sleep. Was a smoker. Maternal  grandmother and grandfather has strokes.   TG 157, HDL 63, LDL 144, Tchol 238  Past Medical History:  Diagnosis Date  . Depression   . Hypothyroidism     Past Surgical History:  Procedure Laterality Date  . BREAST BIOPSY Right 02/28/2015  . BREAST EXCISIONAL BIOPSY Right 03/29/2015   had seed placement as wel  . ENDOMETRIAL ABLATION    . MOUTH SURGERY    . RADIOACTIVE SEED GUIDED EXCISIONAL BREAST BIOPSY Right 04/03/2015   Procedure: RADIOACTIVE SEED GUIDED EXCISIONAL BREAST BIOPSY;  Surgeon: Rolm Bookbinder, MD;  Location: [facility];  Service: General;  Laterality: Right;    Current Medications: Current Outpatient Medications on File Prior to Visit  Medication Sig  . escitalopram (LEXAPRO) 10 MG tablet Take 10 mg by mouth daily.  Marland Kitchen levothyroxine (SYNTHROID, LEVOTHROID) 50 MCG tablet Take 50 mcg by mouth daily before breakfast.  . Multiple Vitamin (MULTIVITAMIN) tablet Take 1 tablet by mouth daily.  . norethindrone-ethinyl estradiol-iron (MICROGESTIN FE,GILDESS FE,LOESTRIN FE) 1.5-30 MG-MCG tablet Take 1 tablet by mouth daily.   No current facility-administered medications on file prior to visit.      Allergies:   Patient has no known allergies.   Social History   Socioeconomic History  . Marital status: Married    Spouse name: Not on file  . Number of children: Not on file  . Years of education: Not on file  . Highest education level: Not on file  Occupational History  . Not on file  Social Needs  . Financial resource strain: Not on file  . Food insecurity:    Worry: Not on file    Inability: Not on file  .  Transportation needs:    Medical: Not on file    Non-medical: Not on file  Tobacco Use  . Smoking status: Never Smoker  . Smokeless tobacco: Never Used  Substance and Sexual Activity  . Alcohol use: Not on file  . Drug use: Not on file  . Sexual activity: Not on file  Lifestyle  . Physical activity:    Days per week: Not on file     Minutes per session: Not on file  . Stress: Not on file  Relationships  . Social connections:    Talks on phone: Not on file    Gets together: Not on file    Attends religious service: Not on file    Active member of club or organization: Not on file    Attends meetings of clubs or organizations: Not on file    Relationship status: Not on file  Other Topics Concern  . Not on file  Social History Narrative  . Not on file     Family History: father had thyroid disease. Paternal grandmother had breast cancer. mother had first MI at age 94, high cholesterol, had 3V CABG at age 75. Had CHF at age 12, passed shortly after. Also has scleroderma, Raynaud's, multiple sclerosis, other autoimmune issues. Died in her sleep. Was a smoker. Maternal grandmother and grandfather has strokes.   ROS:   Please see the history of present illness.  Additional pertinent ROS:  Constitutional: Negative for chills, fever, night sweats, unintentional weight loss  HENT: Negative for ear pain and hearing loss.   Eyes: Negative for loss of vision and eye pain.  Respiratory: Negative for cough, sputum, shortness of breath, wheezing.   Cardiovascular: Positive for palpitations. Negative for chest pain, PND, orthopnea, lower extremity edema and claudication.  Gastrointestinal: Negative for abdominal pain, melena, and hematochezia.  Genitourinary: Negative for dysuria and hematuria.  Musculoskeletal: Negative for falls and myalgias.  Skin: Negative for itching and rash.  Neurological: Negative for focal weakness, focal sensory changes and loss of consciousness.  Endo/Heme/Allergies: Does not bruise/bleed easily.    EKGs/Labs/Other Studies Reviewed:    The following studies were reviewed today: Prior notes in EMR, notes from PCP office  EKG:  EKG is ordered today.  The ekg ordered today demonstrates normal sinus rhythm  Recent Labs: No results found for requested labs within last 8760 hours.  Recent Lipid  Panel No results found for: CHOL, TRIG, HDL, CHOLHDL, VLDL, LDLCALC, LDLDIRECT  Physical Exam:    VS:  BP 130/68 (BP Location: Left Arm, Patient Position: Sitting, Cuff Size: Normal)   Pulse 60   Ht 5\' 4"  (1.626 m)   Wt 161 lb (73 kg)   BMI 27.64 kg/m     Wt Readings from Last 3 Encounters:  05/10/18 161 lb (73 kg)  04/03/15 158 lb (71.7 kg)     GEN: Well nourished, well developed in no acute distress HEENT: Normal NECK: No JVD; No carotid bruits LYMPHATICS: No lymphadenopathy CARDIAC: regular rhythm, normal S1 and S2, no murmurs, rubs, gallops. Radial and DP pulses 2+ bilaterally. RESPIRATORY:  Clear to auscultation without rales, wheezing or rhonchi  ABDOMEN: Soft, non-tender, non-distended MUSCULOSKELETAL:  No edema; No deformity  SKIN: Warm and dry NEUROLOGIC:  Alert and oriented x 3 PSYCHIATRIC:  Normal affect   ASSESSMENT:    1. Palpitation   2. Mixed hyperlipidemia   3. Family history of heart disease   4. Counseling on health promotion and disease prevention    PLAN:  1. Palpitations  -7 day zio patch to evaluate for arrhythmia  -hold on metoprolol for now until rhythm evaluated  -no syncope or high risk features  2. Mixed hyperlipidemia: see below re: prevention. Does not wish to start medications unless absolutely necessary.  3. Primary prevention -recommend heart healthy/Mediterranean diet, with whole grains, fruits, vegetable, fish, lean meats, nuts, and olive oil. Limit salt. -recommend moderate walking, 3-5 times/week for 30-50 minutes each session. Aim for at least 150 minutes.week. Goal should be pace of 3 miles/hours, or walking 1.5 miles in 30 minutes -recommend avoidance of tobacco products. Avoid excess alcohol. -Additional risk factor control:  -Diabetes: A1c is not available  -Lipids: TG 157, HDL 63, LDL 144, Tchol 238  -Blood pressure control: on no meds, continue lifestyle  -Weight: BMI 27; exercises, follows Mediterranean-type  diet -ASCVD risk score: Note that her mother's history of heart disease at a young age is not taken into account in this score. The 10-year ASCVD risk score Mikey Bussing DC Brooke Bonito., et al., 2013) is: 1.2%   Values used to calculate the score:     Age: 24 years     Sex: Female     Is Non-Hispanic African American: No     Diabetic: No     Tobacco smoker: No     Systolic Blood Pressure: 536 mmHg     Is BP treated: No     HDL Cholesterol: 63 MG/DL     Total Cholesterol: 238 MG/DL  Discussed options for further risk stratification, including hs-CRP, lp(a), coronary calcium score. She plans to get her lipids rechecked soon at her PCP and would like to see those results before deciding on further testing.   Plan for follow up: 6 weeks to monitor symptoms, discuss results, decided on further ASCVD risk stratification  Medication Adjustments/Labs and Tests Ordered: Current medicines are reviewed at length with the patient today.  Concerns regarding medicines are outlined above.  Orders Placed This Encounter  Procedures  . LONG TERM MONITOR (3-14 DAYS)  . EKG 12-Lead   No orders of the defined types were placed in this encounter.   Patient Instructions  Medication Instructions:  Your Physician recommend you continue on your current medication as directed.    If you need a refill on your cardiac medications before your next appointment, please call your pharmacy.   Lab work: None   Testing/Procedures: Our physician has recommended that you wear an 7 DAY ZIO-PATCH monitor. The Zio patch cardiac monitor continuously records heart rhythm data for up to 14 days, this is for patients being evaluated for multiple types heart rhythms. For the first 24 hours post application, please avoid getting the Zio monitor wet in the shower or by excessive sweating during exercise. After that, feel free to carry on with regular activities. Keep soaps and lotions away from the ZIO XT Patch.   This will be placed at  our [facility] location - [address], [address].      Follow-Up: At [facility], you and your health needs are our priority.  As part of our continuing mission to provide you with exceptional heart care, we have created designated Provider Care Teams.  These Care Teams include your primary Cardiologist (physician) and Advanced Practice Providers (APPs -  Physician Assistants and Nurse Practitioners) who all work together to provide you with the care you need, when you need it. You will need a follow up appointment in 6 weeks.  Please call our office  2 months in advance to schedule this appointment.  You may see Dr. Harrell Gave or one of the following Advanced Practice Providers on your designated Care Team:   Rosaria Ferries, PA-C . Jory Sims, DNP, ANP  Any Other Special Instructions Will Be Listed Below (If Applicable).       Signed, Buford Dresser, MD PhD 05/10/2018 4:52 PM    [place]

## 2018-05-19 ENCOUNTER — Ambulatory Visit (INDEPENDENT_AMBULATORY_CARE_PROVIDER_SITE_OTHER): Payer: 59

## 2018-05-19 DIAGNOSIS — R002 Palpitations: Secondary | ICD-10-CM

## 2018-05-27 ENCOUNTER — Encounter: Payer: Self-pay | Admitting: Family Medicine

## 2018-05-28 DIAGNOSIS — R002 Palpitations: Secondary | ICD-10-CM | POA: Diagnosis not present

## 2018-06-01 ENCOUNTER — Encounter: Payer: Self-pay | Admitting: Internal Medicine

## 2018-07-07 LAB — HM COLONOSCOPY

## 2018-07-13 ENCOUNTER — Ambulatory Visit: Payer: 59 | Admitting: Cardiology

## 2018-07-13 ENCOUNTER — Ambulatory Visit (AMBULATORY_SURGERY_CENTER): Payer: Self-pay

## 2018-07-13 VITALS — Ht 63.0 in | Wt 168.4 lb

## 2018-07-13 DIAGNOSIS — Z1211 Encounter for screening for malignant neoplasm of colon: Secondary | ICD-10-CM

## 2018-07-13 MED ORDER — NA SULFATE-K SULFATE-MG SULF 17.5-3.13-1.6 GM/177ML PO SOLN: 1.0000 | Freq: Once | ORAL | 0 refills | Status: AC

## 2018-07-13 NOTE — Progress Notes (Signed)
Denies allergies to eggs or soy products. Denies complication of anesthesia or sedation. Denies use of weight loss medication. Denies use of O2.   Emmi instructions declined.  

## 2018-07-16 ENCOUNTER — Encounter: Payer: Self-pay | Admitting: Internal Medicine

## 2018-07-30 ENCOUNTER — Encounter: Payer: Self-pay | Admitting: Internal Medicine

## 2018-07-30 ENCOUNTER — Ambulatory Visit (AMBULATORY_SURGERY_CENTER): Payer: 59 | Admitting: Internal Medicine

## 2018-07-30 VITALS — BP 112/51 | HR 69 | Temp 98.6°F | Resp 20 | Ht 64.0 in | Wt 161.0 lb

## 2018-07-30 DIAGNOSIS — D123 Benign neoplasm of transverse colon: Secondary | ICD-10-CM | POA: Diagnosis not present

## 2018-07-30 DIAGNOSIS — D120 Benign neoplasm of cecum: Secondary | ICD-10-CM

## 2018-07-30 DIAGNOSIS — Z1211 Encounter for screening for malignant neoplasm of colon: Secondary | ICD-10-CM

## 2018-07-30 DIAGNOSIS — D122 Benign neoplasm of ascending colon: Secondary | ICD-10-CM

## 2018-07-30 NOTE — Progress Notes (Signed)
Called to room to assist during endoscopic procedure.  Patient ID and intended procedure confirmed with present staff. Received instructions for my participation in the procedure from the performing physician.  

## 2018-07-30 NOTE — Patient Instructions (Signed)
YOU HAD AN ENDOSCOPIC PROCEDURE TODAY AT [facility]:   Refer to the procedure report that was given to you for any specific questions about what was found during the examination.  If the procedure report does not answer your questions, please call your gastroenterologist to clarify.  If you requested that your care partner not be given the details of your procedure findings, then the procedure report has been included in a sealed envelope for you to review at your convenience later.  YOU SHOULD EXPECT: Some feelings of bloating in the abdomen. Passage of more gas than usual.  Walking can help get rid of the air that was put into your GI tract during the procedure and reduce the bloating. If you had a lower endoscopy (such as a colonoscopy or flexible sigmoidoscopy) you may notice spotting of blood in your stool or on the toilet paper. If you underwent a bowel prep for your procedure, you may not have a normal bowel movement for a few days.  Please Note:  You might notice some irritation and congestion in your nose or some drainage.  This is from the oxygen used during your procedure.  There is no need for concern and it should clear up in a day or so.  SYMPTOMS TO REPORT IMMEDIATELY:   Following lower endoscopy (colonoscopy or flexible sigmoidoscopy):  Excessive amounts of blood in the stool  Significant tenderness or worsening of abdominal pains  Swelling of the abdomen that is new, acute  Fever of 100F or higher   For urgent or emergent issues, a gastroenterologist can be reached at any hour by calling [phone].   DIET:  We do recommend a small meal at first, but then you may proceed to your regular diet.  Drink plenty of fluids but you should avoid alcoholic beverages for 24 hours.  Try to increase the fiber in your diet, and drink plenty of water.  ACTIVITY:  You should plan to take it easy for the rest of today and you should NOT DRIVE or use heavy machinery until  tomorrow (because of the sedation medicines used during the test).    FOLLOW UP: Our staff will call the number listed on your records the next business day following your procedure to check on you and address any questions or concerns that you may have regarding the information given to you following your procedure. If we do not reach you, we will leave a message.  However, if you are feeling well and you are not experiencing any problems, there is no need to return our call.  We will assume that you have returned to your regular daily activities without incident.  Read all handouts given to you by your recovery room nurse.  If any biopsies were taken you will be contacted by phone or by letter within the next 1-3 weeks.  Please call us at [phone] if you have not heard about the biopsies in 3 weeks.    SIGNATURES/CONFIDENTIALITY: You and/or your care partner have signed paperwork which will be entered into your electronic medical record.  These signatures attest to the fact that that the information above on your After Visit Summary has been reviewed and is understood.  Full responsibility of the confidentiality of this discharge information lies with you and/or your care-partner.

## 2018-07-30 NOTE — Progress Notes (Signed)
Pt awake. Vss. report given to rn. No anesthetic complications noted

## 2018-07-30 NOTE — Op Note (Signed)
[place] Patient Name: Tara Yates Procedure Date: 07/30/2018 9:13 AM MRN: [id number] Endoscopist: Docia Chuck. Henrene Pastor , MD Age: 53 Referring MD:  Date of Birth: 1966-01-06 Gender: Female Account #: [id number] Procedure:                Colonoscopy with cold snare polypectomy x5 Indications:              Screening for colorectal malignant neoplasm Medicines:                Monitored Anesthesia Care Procedure:                Pre-Anesthesia Assessment:                           - Prior to the procedure, a History and Physical                            was performed, and patient medications and                            allergies were reviewed. The patient's tolerance of                            previous anesthesia was also reviewed. The risks                            and benefits of the procedure and the sedation                            options and risks were discussed with the patient.                            All questions were answered, and informed consent                            was obtained. Prior Anticoagulants: The patient has                            taken no previous anticoagulant or antiplatelet                            agents. ASA Grade Assessment: II - A patient with                            mild systemic disease. After reviewing the risks                            and benefits, the patient was deemed in                            satisfactory condition to undergo the procedure.                           After obtaining informed consent, the colonoscope  was passed under direct vision. Throughout the                            procedure, the patient's blood pressure, pulse, and                            oxygen saturations were monitored continuously. The                            Colonoscope was introduced through the anus and                            advanced to the the cecum, identified by                             appendiceal orifice and ileocecal valve. The                            ileocecal valve, appendiceal orifice, and rectum                            were photographed. The quality of the bowel                            preparation was excellent. The colonoscopy was                            performed without difficulty. The patient tolerated                            the procedure well. The bowel preparation used was                            SUPREP. Scope In: 9:24:43 AM Scope Out: 9:44:34 AM Scope Withdrawal Time: 0 hours 14 minutes 58 seconds  Total Procedure Duration: 0 hours 19 minutes 51 seconds  Findings:                 Five polyps were found in the transverse colon,                            ascending colon and cecum. The polyps were 2 to 5                            mm in size. These polyps were removed with a cold                            snare. Resection and retrieval were complete.                           Multiple diverticula were found in the left colon                            and right colon.  Internal hemorrhoids were found during retroflexion.                           The exam was otherwise without abnormality on                            direct and retroflexion views. Complications:            No immediate complications. Estimated blood loss:                            None. Estimated Blood Loss:     Estimated blood loss: none. Impression:               - Five 2 to 5 mm polyps in the transverse colon, in                            the ascending colon and in the cecum, removed with                            a cold snare. Resected and retrieved.                           - Diverticulosis in the left colon and in the right                            colon.                           - Internal hemorrhoids.                           - The examination was otherwise normal on direct                            and retroflexion  views. Recommendation:           - Repeat colonoscopy in 3 or 5 years for                            surveillance, pending final pathology.                           - Patient has a contact number available for                            emergencies. The signs and symptoms of potential                            delayed complications were discussed with the                            patient. Return to normal activities tomorrow.                            Written discharge instructions were provided to the  patient.                           - Resume previous diet.                           - Continue present medications.                           - Await pathology results. Docia Chuck. Henrene Pastor, MD 07/30/2018 9:50:51 AM This report has been signed electronically.

## 2018-08-02 ENCOUNTER — Telehealth: Payer: Self-pay

## 2018-08-02 NOTE — Telephone Encounter (Signed)
  Follow up Call-  Call back number 07/30/2018  Post procedure Call Back phone  # [phone]  Permission to leave phone message Yes  Some recent data might be hidden     Patient questions:  Do you have a fever, pain , or abdominal swelling? No. Pain Score  0 *  Have you tolerated food without any problems? Yes.    Have you been able to return to your normal activities? Yes.    Do you have any questions about your discharge instructions: Diet   No. Medications  No. Follow up visit  No.  Do you have questions or concerns about your Care? No.  Actions: * If pain score is 4 or above: No action needed, pain <4.

## 2018-08-04 ENCOUNTER — Encounter: Payer: Self-pay | Admitting: Internal Medicine

## 2018-08-24 DIAGNOSIS — E786 Lipoprotein deficiency: Secondary | ICD-10-CM | POA: Insufficient documentation

## 2019-04-26 ENCOUNTER — Other Ambulatory Visit: Payer: Self-pay | Admitting: Family Medicine

## 2019-04-26 DIAGNOSIS — Z1231 Encounter for screening mammogram for malignant neoplasm of breast: Secondary | ICD-10-CM

## 2019-05-05 ENCOUNTER — Other Ambulatory Visit: Payer: Self-pay

## 2019-05-05 ENCOUNTER — Encounter: Payer: Self-pay | Admitting: Family Medicine

## 2019-05-05 ENCOUNTER — Ambulatory Visit: Payer: 59 | Admitting: Family Medicine

## 2019-05-05 VITALS — BP 110/70 | HR 70 | Temp 97.0°F | Wt 177.0 lb

## 2019-05-05 DIAGNOSIS — Z7689 Persons encountering health services in other specified circumstances: Secondary | ICD-10-CM

## 2019-05-05 DIAGNOSIS — M2550 Pain in unspecified joint: Secondary | ICD-10-CM | POA: Diagnosis not present

## 2019-05-05 DIAGNOSIS — E039 Hypothyroidism, unspecified: Secondary | ICD-10-CM | POA: Diagnosis not present

## 2019-05-05 DIAGNOSIS — R635 Abnormal weight gain: Secondary | ICD-10-CM | POA: Diagnosis not present

## 2019-05-05 NOTE — Progress Notes (Signed)
Patient presents to clinic today to f/u and establish care.  SUBJECTIVE: PMH: Pt is a 53 yo female with pmh sig for hypothyrioidism and arthralgias.  Pt previously seen by Dr. Rolan Lipa at [facility].  Hypothyroidism: -on levothyroxine 25 mcg -endorses recent TSH -states TSH was always "borderline" but started meds 2/2 hair loss, weight gain, fatigue, finger nails would not grow.  Joint pain: -endorses occasional joint pain  Weight gain: -pt notes difficulty losing weight -States has been harder since thyroid issues and as getting older. -tried keto, decreased carbs, exercise, weight watchers, noom.    Former Smoker: -smoked 1 ppd from 53 yo-53 yo  Past Surgical Hx: -breast biopsy -endometriosis  Social Hx: Pt is married.  Pt works at [employer] for a [employer].    Family Medical history:  Mom-Scleroderma, MS Dad-PVD, CAD, HLD, HTN PGM-breast cancer MGM-stroke MGF-stroke  Past Medical History:  Diagnosis Date  . Allergy   . Anxiety   . Depression   . Hyperlipidemia   . Hypothyroidism     Past Surgical History:  Procedure Laterality Date  . BREAST BIOPSY Right 02/28/2015  . BREAST EXCISIONAL BIOPSY Right 03/29/2015   had seed placement as wel  . ENDOMETRIAL ABLATION    . MOUTH SURGERY    . RADIOACTIVE SEED GUIDED EXCISIONAL BREAST BIOPSY Right 04/03/2015   Procedure: RADIOACTIVE SEED GUIDED EXCISIONAL BREAST BIOPSY;  Surgeon: Rolm Bookbinder, MD;  Location: [place];  Service: General;  Laterality: Right;    Current Outpatient Medications on File Prior to Visit  Medication Sig Dispense Refill  . escitalopram (LEXAPRO) 10 MG tablet Take 10 mg by mouth daily.    Marland Kitchen levothyroxine (SYNTHROID, LEVOTHROID) 50 MCG tablet Take 50 mcg by mouth daily before breakfast.    . Multiple Vitamin (MULTIVITAMIN) tablet Take 1 tablet by mouth daily.    . norethindrone-ethinyl estradiol-iron (MICROGESTIN FE,GILDESS  FE,LOESTRIN FE) 1.5-30 MG-MCG tablet Take 1 tablet by mouth daily.     No current facility-administered medications on file prior to visit.     No Known Allergies  Family History  Problem Relation Age of Onset  . Heart disease Mother   . Heart failure Mother   . Heart attack Mother   . Hyperlipidemia Mother   . Heart disease Father   . Hyperlipidemia Father   . Hypertension Father   . Breast cancer Neg Hx   . Colon cancer Neg Hx   . Esophageal cancer Neg Hx   . Rectal cancer Neg Hx   . Stomach cancer Neg Hx     Social History   Socioeconomic History  . Marital status: Married    Spouse name: Not on file  . Number of children: Not on file  . Years of education: Not on file  . Highest education level: Not on file  Occupational History  . Not on file  Social Needs  . Financial resource strain: Not on file  . Food insecurity    Worry: Not on file    Inability: Not on file  . Transportation needs    Medical: Not on file    Non-medical: Not on file  Tobacco Use  . Smoking status: Former Research scientist (life sciences)  . Smokeless tobacco: Never Used  . Tobacco comment: Quit 20 years ago  Substance and Sexual Activity  . Alcohol use: Yes    Comment: Wine daily  . Drug use: Never  . Sexual activity: Not on file  Lifestyle  .  Physical activity    Days per week: Not on file    Minutes per session: Not on file  . Stress: Not on file  Relationships  . Social Herbalist on phone: Not on file    Gets together: Not on file    Attends religious service: Not on file    Active member of club or organization: Not on file    Attends meetings of clubs or organizations: Not on file    Relationship status: Not on file  . Intimate partner violence    Fear of current or ex partner: Not on file    Emotionally abused: Not on file    Physically abused: Not on file    Forced sexual activity: Not on file  Other Topics Concern  . Not on file  Social History Narrative  . Not on file     ROS General: Denies fever, chills, night sweats, changes in appetite.  +weight gain HEENT: Denies headaches, ear pain, changes in vision, rhinorrhea, sore throat CV: Denies CP, palpitations, SOB, orthopnea Pulm: Denies SOB, cough, wheezing GI: Denies abdominal pain, nausea, vomiting, diarrhea, constipation GU: Denies dysuria, hematuria, frequency, vaginal discharge Msk: Denies muscle cramps, joint pains Neuro: Denies weakness, numbness, tingling Skin: Denies rashes, bruising Psych: Denies depression, anxiety, hallucinations  BP 110/70 (BP Location: Left Arm, Patient Position: Sitting, Cuff Size: Normal)   Pulse 70   Temp (!) 97 F (36.1 C) (Temporal)   Wt 177 lb (80.3 kg)   SpO2 97%   BMI 30.38 kg/m   Physical Exam Gen. Pleasant, well developed, well-nourished, in NAD HEENT - /AT, PERRL, conjunctive clear, no scleral icterus, no nasal drainage, pharynx without erythema or exudate. Lungs: no use of accessory muscles, CTAB, no wheezes, rales or rhonchi Cardiovascular: RRR, No r/g/m, no peripheral edema Musculoskeletal: No deformities, moves all four extremities, no cyanosis or clubbing, normal tone Neuro:  A&Ox3, CN II-XII intact, normal gait Skin:  Warm, dry, intact, no lesions  No results found for this or any previous visit (from the past 2160 hour(s)).  Assessment/Plan: Acquired hypothyroidism -continue levothyroxine 25 mcg -discussed rechecking TSH  Arthralgia of multiple joints -discussed supportive care -consider Tylenol arthritis prn  Weight gain -discussed lifestyle modifications -discussed exercise suggestions -will recheck TSH at next [facility].  Encounter to establish care -We reviewed the PMH, PSH, FH, SH, Meds and Allergies. -We provided refills for any medications we will prescribe as needed. -We addressed current concerns per orders and patient instructions. -We have asked for records for pertinent exams, studies, vaccines and notes from previous  providers. -We have advised patient to follow up per instructions below.  F/u in the next few months  Grier Mitts, MD

## 2019-05-05 NOTE — Patient Instructions (Signed)
Hypothyroidism  Hypothyroidism is when the thyroid gland does not make enough of certain hormones (it is underactive). The thyroid gland is a small gland located in the lower front part of the neck, just in front of the windpipe (trachea). This gland makes hormones that help control how the body uses food for energy (metabolism) as well as how the heart and brain function. These hormones also play a role in keeping your bones strong. When the thyroid is underactive, it produces too little of the hormones thyroxine (T4) and triiodothyronine (T3). What are the causes? This condition may be caused by:  Hashimoto's disease. This is a disease in which the body's disease-fighting system (immune system) attacks the thyroid gland. This is the most common cause.  Viral infections.  Pregnancy.  Certain medicines.  Birth defects.  Past radiation treatments to the head or neck for cancer.  Past treatment with radioactive iodine.  Past exposure to radiation in the environment.  Past surgical removal of part or all of the thyroid.  Problems with a gland in the center of the brain (pituitary gland).  Lack of enough iodine in the diet. What increases the risk? You are more likely to develop this condition if:  You are female.  You have a family history of thyroid conditions.  You use a medicine called lithium.  You take medicines that affect the immune system (immunosuppressants). What are the signs or symptoms? Symptoms of this condition include:  Feeling as though you have no energy (lethargy).  Not being able to tolerate cold.  Weight gain that is not explained by a change in diet or exercise habits.  Lack of appetite.  Dry skin.  Coarse hair.  Menstrual irregularity.  Slowing of thought processes.  Constipation.  Sadness or depression. How is this diagnosed? This condition may be diagnosed based on:  Your symptoms, your medical history, and a physical exam.  Blood  tests. You may also have imaging tests, such as an ultrasound or MRI. How is this treated? This condition is treated with medicine that replaces the thyroid hormones that your body does not make. After you begin treatment, it may take several weeks for symptoms to go away. Follow these instructions at home:  Take over-the-counter and prescription medicines only as told by your health care provider.  If you start taking any new medicines, tell your health care provider.  Keep all follow-up visits as told by your health care provider. This is important. ? As your condition improves, your dosage of thyroid hormone medicine may change. ? You will need to have blood tests regularly so that your health care provider can monitor your condition. Contact a health care provider if:  Your symptoms do not get better with treatment.  You are taking thyroid replacement medicine and you: ? Sweat a lot. ? Have tremors. ? Feel anxious. ? Lose weight rapidly. ? Cannot tolerate heat. ? Have emotional swings. ? Have diarrhea. ? Feel weak. Get help right away if you have:  Chest pain.  An irregular heartbeat.  A rapid heartbeat.  Difficulty breathing. Summary  Hypothyroidism is when the thyroid gland does not make enough of certain hormones (it is underactive).  When the thyroid is underactive, it produces too little of the hormones thyroxine (T4) and triiodothyronine (T3).  The most common cause is Hashimoto's disease, a disease in which the body's disease-fighting system (immune system) attacks the thyroid gland. The condition can also be caused by viral infections, medicine, pregnancy, or past   radiation treatment to the head or neck.  Symptoms may include weight gain, dry skin, constipation, feeling as though you do not have energy, and not being able to tolerate cold.  This condition is treated with medicine to replace the thyroid hormones that your body does not make. This information  is not intended to replace advice given to you by your health care provider. Make sure you discuss any questions you have with your health care provider. Document Released: 06/23/2005 Document Revised: 06/05/2017 Document Reviewed: 06/03/2017 Elsevier Patient Education  [place].  Joint Pain Joint pain (arthralgia) may be caused by many things. Joint pain is likely to go away when you follow instructions from your health care provider for relieving pain at home. However, joint pain can also be caused by conditions that require more treatment. Common causes of joint pain include:  Bruising in the area of the joint.  Injury caused by repeating certain movements too many times (overuse injury).  Age-related joint wear and tear (osteoarthritis).  Buildup of uric acid crystals in the joint (gout).  Inflammation of the joint (rheumatic disease).  Various other forms of arthritis.  Infections of the joint (septic arthritis) or of the bone (osteomyelitis). Your health care provider may recommend that you take pain medicine or wear a supportive device like an elastic bandage, sling, or splint. If your joint pain continues, you may need lab or imaging tests to diagnose the cause of your joint pain. Follow these instructions at home: Managing pain, stiffness, and swelling   If directed, put ice on the painful area. Icing can help to relieve joint pain and swelling. ? Put ice in a plastic bag. ? Place a towel between your skin and the bag. ? Leave the ice on for 20 minutes, 2-3 times a day.  If directed, apply heat to the painful area as often as told by your health care provider. Heat can reduce the stiffness of your muscles and joints. Use the heat source that your health care provider recommends, such as a moist heat pack or a heating pad. ? Place a towel between your skin and the heat source. ? Leave the heat on for 20-30 minutes. ? Remove the heat if your skin turns bright red.  This is especially important if you are unable to feel pain, heat, or cold. You may have a greater risk of getting burned.  Move your fingers or toes below the painful joint often. You can avoid stiffness and lessen swelling by doing this.  If possible, raise (elevate) the painful joint above the level of your heart while you are sitting or lying down. To do this, try putting a few pillows under the painful joint. Activity  Rest the painful joint for as long as directed. Do not do anything that causes or worsens pain.  Begin exercising or stretching the affected area, as told by your health care provider. Ask your health care provider what types of exercise are safe for you. If you have an elastic bandage, sling, or splint:  Wear the supportive device as told by your health care provider. Remove it only as told by your health care provider.  Loosen the device if your fingers or toes below the joint tingle, become numb, or turn cold and blue.  Keep the device clean.  Ask your health care provider if you should remove the device before bathing. You may need to cover it with a watertight covering when you take a bath or a  shower. General instructions  Take over-the-counter and prescription medicines only as told by your health care provider.  Do not use any products that contain nicotine or tobacco, such as cigarettes and e-cigarettes. If you need help quitting, ask your health care provider.  Keep all follow-up visits as told by your health care provider. This is important. Contact a health care provider if:  You have pain that gets worse and does not get better with medicine.  Your joint pain does not improve within 3 days.  You have increased bruising or swelling.  You have a fever.  You lose 10 lb (4.5 kg) or more without trying. Get help right away if:  You cannot move the joint.  Your fingers or toes tingle, become numb, or turn cold and blue.  You have a fever along  with a joint that is red, warm, and swollen. Summary  Joint pain (arthralgia) may be caused by many things.  Your health care provider may recommend that you take pain medicine or wear a supportive device like an elastic bandage, sling, or splint.  If your joint pain continues, you may need tests to diagnose the cause of your joint pain.  Take over-the-counter and prescription medicines only as told by your health care provider. This information is not intended to replace advice given to you by your health care provider. Make sure you discuss any questions you have with your health care provider. Document Released: 06/23/2005 Document Revised: 06/05/2017 Document Reviewed: 04/08/2017 Elsevier Patient Education  2020 [employer].  Exercising to [employer] Exercise is structured, repetitive physical activity to improve fitness and health. Getting regular exercise is important for everyone. It is especially important if you are overweight. Being overweight increases your risk of heart disease, stroke, diabetes, high blood pressure, and several types of cancer. Reducing your calorie intake and exercising can help you lose weight. Exercise is usually categorized as moderate or vigorous intensity. To lose weight, most people need to do a certain amount of moderate-intensity or vigorous-intensity exercise each week. Moderate-intensity exercise  Moderate-intensity exercise is any activity that gets you moving enough to burn at least three times more energy (calories) than if you were sitting. Examples of moderate exercise include:  Walking a mile in 15 minutes.  Doing light yard work.  Biking at an easy pace. Most people should get at least 150 minutes (2 hours and 30 minutes) a week of moderate-intensity exercise to maintain their body weight. Vigorous-intensity exercise Vigorous-intensity exercise is any activity that gets you moving enough to burn at least six times more calories than if  you were sitting. When you exercise at this intensity, you should be working hard enough that you are not able to carry on a conversation. Examples of vigorous exercise include:  Running.  Playing a team sport, such as football, basketball, and soccer.  Jumping rope. Most people should get at least 75 minutes (1 hour and 15 minutes) a week of vigorous-intensity exercise to maintain their body weight. How can exercise affect me? When you exercise enough to burn more calories than you eat, you lose weight. Exercise also reduces body fat and builds muscle. The more muscle you have, the more calories you burn. Exercise also:  Improves mood.  Reduces stress and tension.  Improves your overall fitness, flexibility, and endurance.  Increases bone strength. The amount of exercise you need to lose weight depends on:  Your age.  The type of exercise.  Any health conditions you have.  Your overall physical ability. Talk to your health care provider about how much exercise you need and what types of activities are safe for you. What actions can I take to lose weight? Nutrition   Make changes to your diet as told by your health care provider or diet and nutrition specialist (dietitian). This may include: ? Eating fewer calories. ? Eating more protein. ? Eating less unhealthy fats. ? Eating a diet that includes fresh fruits and vegetables, whole grains, low-fat dairy products, and lean protein. ? Avoiding foods with added fat, salt, and sugar.  Drink plenty of water while you exercise to prevent dehydration or heat stroke. Activity  Choose an activity that you enjoy and set realistic goals. Your health care provider can help you make an exercise plan that works for you.  Exercise at a moderate or vigorous intensity most days of the week. ? The intensity of exercise may vary from person to person. You can tell how intense a workout is for you by paying attention to your breathing and  heartbeat. Most people will notice their breathing and heartbeat get faster with more intense exercise.  Do resistance training twice each week, such as: ? Push-ups. ? Sit-ups. ? Lifting weights. ? Using resistance bands.  Getting short amounts of exercise can be just as helpful as long structured periods of exercise. If you have trouble finding time to exercise, try to include exercise in your daily routine. ? Get up, stretch, and walk around every 30 minutes throughout the day. ? Go for a walk during your lunch break. ? Park your car farther away from your destination. ? If you take public transportation, get off one stop early and walk the rest of the way. ? Make phone calls while standing up and walking around. ? Take the stairs instead of elevators or escalators.  Wear comfortable clothes and shoes with good support.  Do not exercise so much that you hurt yourself, feel dizzy, or get very short of breath. Where to find more information  U.S. Department of Health and Human Services: [employer].at  Centers for Disease Control and Prevention (CDC): http://www.wolf.info/ Contact a health care provider:  Before starting a new exercise program.  If you have questions or concerns about your weight.  If you have a medical problem that keeps you from exercising. Get help right away if you have any of the following while exercising:  Injury.  Dizziness.  Difficulty breathing or shortness of breath that does not go away when you stop exercising.  Chest pain.  Rapid heartbeat. Summary  Being overweight increases your risk of heart disease, stroke, diabetes, high blood pressure, and several types of cancer.  Losing weight happens when you burn more calories than you eat.  Reducing the amount of calories you eat in addition to getting regular moderate or vigorous exercise each week helps you lose weight. This information is not intended to replace advice given to you by your health care  provider. Make sure you discuss any questions you have with your health care provider. Document Released: 07/26/2010 Document Revised: 07/06/2017 Document Reviewed: 07/06/2017 Elsevier Patient Education  2020 [place].  Preventing Unhealthy [employer], Adult Staying at a healthy weight is important to your overall health. When fat builds up in your body, you may become overweight or obese. Being overweight or obese increases your risk of developing certain health problems, such as heart disease, diabetes, sleeping problems, joint problems, and some types of cancer. Unhealthy weight gain is  often the result of making unhealthy food choices or not getting enough exercise. You can make changes to your lifestyle to prevent obesity and stay as healthy as possible. What nutrition changes can be made?   Eat only as much as your body needs. To do this: ? Pay attention to signs that you are hungry or full. Stop eating as soon as you feel full. ? If you feel hungry, try drinking water first before eating. Drink enough water so your urine is clear or pale yellow. ? Eat smaller portions. Pay attention to portion sizes when eating out. ? Look at serving sizes on food labels. Most foods contain more than one serving per container. ? Eat the recommended number of calories for your gender and activity level. For most active people, a daily total of 2,000 calories is appropriate. If you are trying to lose weight or are not very active, you may need to eat fewer calories. Talk with your health care provider or a diet and nutrition specialist (dietitian) about how many calories you need each day.  Choose healthy foods, such as: ? Fruits and vegetables. At each meal, try to fill at least half of your plate with fruits and vegetables. ? Whole grains, such as whole-wheat bread, brown rice, and quinoa. ? Lean meats, such as chicken or fish. ? Other healthy proteins, such as beans, eggs, or tofu. ? Healthy fats,  such as nuts, seeds, fatty fish, and olive oil. ? Low-fat or fat-free dairy products.  Check food labels, and avoid food and drinks that: ? Are high in calories. ? Have added sugar. ? Are high in sodium. ? Have saturated fats or trans fats.  Cook foods in healthier ways, such as by baking, broiling, or grilling.  Make a meal plan for the week, and shop with a grocery list to help you stay on track with your purchases. Try to avoid going to the grocery store when you are hungry.  When grocery shopping, try to shop around the outside of the store first, where the fresh foods are. Doing this helps you to avoid prepackaged foods, which can be high in sugar, salt (sodium), and fat. What lifestyle changes can be made?   Exercise for 30 or more minutes on 5 or more days each week. Exercising may include brisk walking, yard work, biking, running, swimming, and team sports like basketball and soccer. Ask your health care provider which exercises are safe for you.  Do muscle-strengthening activities, such as lifting weights or using resistance bands, on 2 or more days a week.  Do not use any products that contain nicotine or tobacco, such as cigarettes and e-cigarettes. If you need help quitting, ask your health care provider.  Limit alcohol intake to no more than 1 drink a day for nonpregnant women and 2 drinks a day for men. One drink equals 12 oz of beer, 5 oz of wine, or 1 oz of hard liquor.  Try to get 7-9 hours of sleep each night. What other changes can be made?  Keep a food and activity journal to keep track of: ? What you ate and how many calories you had. Remember to count the calories in sauces, dressings, and side dishes. ? Whether you were active, and what exercises you did. ? Your calorie, weight, and activity goals.  Check your weight regularly. Track any changes. If you notice you have gained weight, make changes to your diet or activity routine.  Avoid taking weight-loss  medicines  or supplements. Talk to your health care provider before starting any new medicine or supplement.  Talk to your health care provider before trying any new diet or exercise plan. Why are these changes important? Eating healthy, staying active, and having healthy habits can help you to prevent obesity. Those changes also:  Help you manage stress and emotions.  Help you connect with friends and family.  Improve your self-esteem.  Improve your sleep.  Prevent long-term health problems. What can happen if changes are not made? Being obese or overweight can cause you to develop joint or bone problems, which can make it hard for you to stay active or do activities you enjoy. Being obese or overweight also puts stress on your heart and lungs and can lead to health problems like diabetes, heart disease, and some cancers. Where to find more information Talk with your health care provider or a dietitian about healthy eating and healthy lifestyle choices. You may also find information from:  [place]. Department of Agriculture, [employer]: FormerB[employer].no  American Heart Association: www.heart.org  Centers for Disease Control and Prevention: http://www.wolf.info/ Summary  Staying at a healthy weight is important to your overall health. It helps you to prevent certain diseases and health problems, such as heart disease, diabetes, joint problems, sleep disorders, and some types of cancer.  Being obese or overweight can cause you to develop joint or bone problems, which can make it hard for you to stay active or do activities you enjoy.  You can prevent unhealthy weight gain by eating a healthy diet, exercising regularly, not smoking, limiting alcohol, and getting enough sleep.  Talk with your health care provider or a dietitian for guidance about healthy eating and healthy lifestyle choices. This information is not intended to replace advice given to you by your health care provider. Make sure you  discuss any questions you have with your health care provider. Document Released: 06/24/2016 Document Revised: 06/26/2017 Document Reviewed: 07/30/2016 Elsevier Patient Education  2020 [employer].

## 2019-05-08 ENCOUNTER — Encounter: Payer: Self-pay | Admitting: Family Medicine

## 2019-06-13 ENCOUNTER — Other Ambulatory Visit: Payer: Self-pay | Admitting: Family Medicine

## 2019-06-13 ENCOUNTER — Ambulatory Visit
Admission: RE | Admit: 2019-06-13 | Discharge: 2019-06-13 | Disposition: A | Discharge status: Home / Self Care | Payer: Self-pay | Source: Ambulatory Visit | Attending: Family Medicine | Admitting: Family Medicine

## 2019-06-13 ENCOUNTER — Other Ambulatory Visit: Payer: Self-pay

## 2019-06-13 DIAGNOSIS — Z1231 Encounter for screening mammogram for malignant neoplasm of breast: Secondary | ICD-10-CM

## 2019-06-13 LAB — HM MAMMOGRAPHY

## 2019-06-13 IMAGING — MG DIGITAL SCREENING BILAT W/ TOMO W/ CAD
8 series · 8 of 24 positions shown · non-contrast
Comparison: Previous exam(s).

CLINICAL DATA: Screening.

EXAM:
DIGITAL SCREENING BILATERAL MAMMOGRAM WITH TOMO AND CAD

[R CC synth-2D]
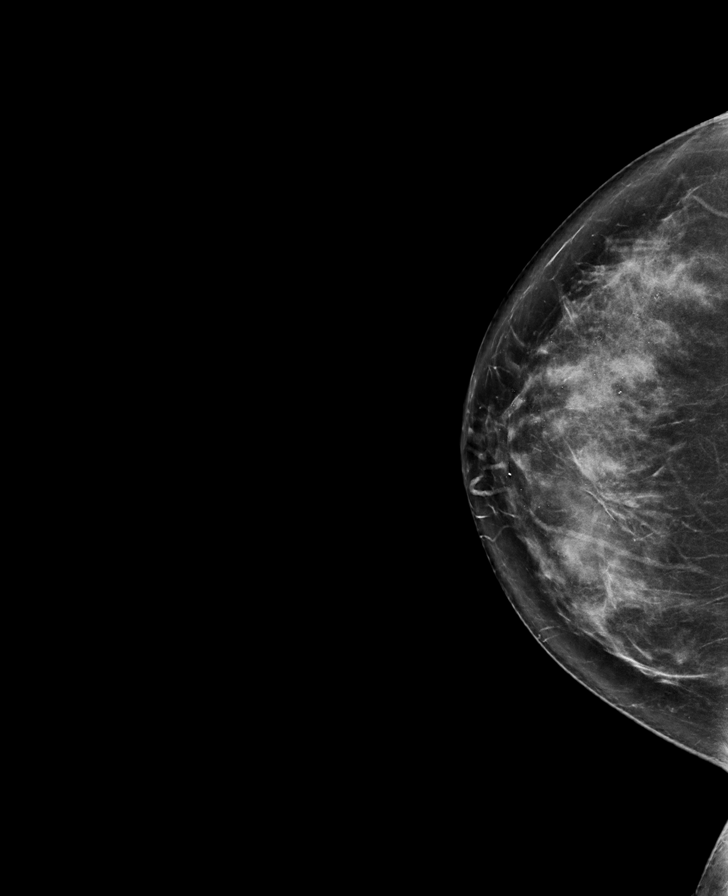

[R MLO synth-2D]
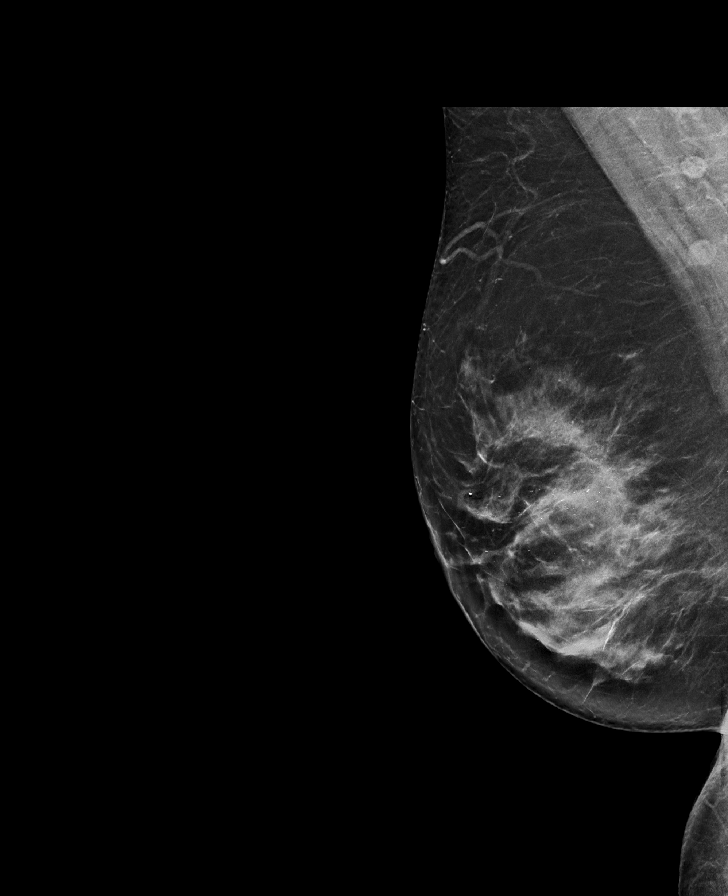

[L CC synth-2D]
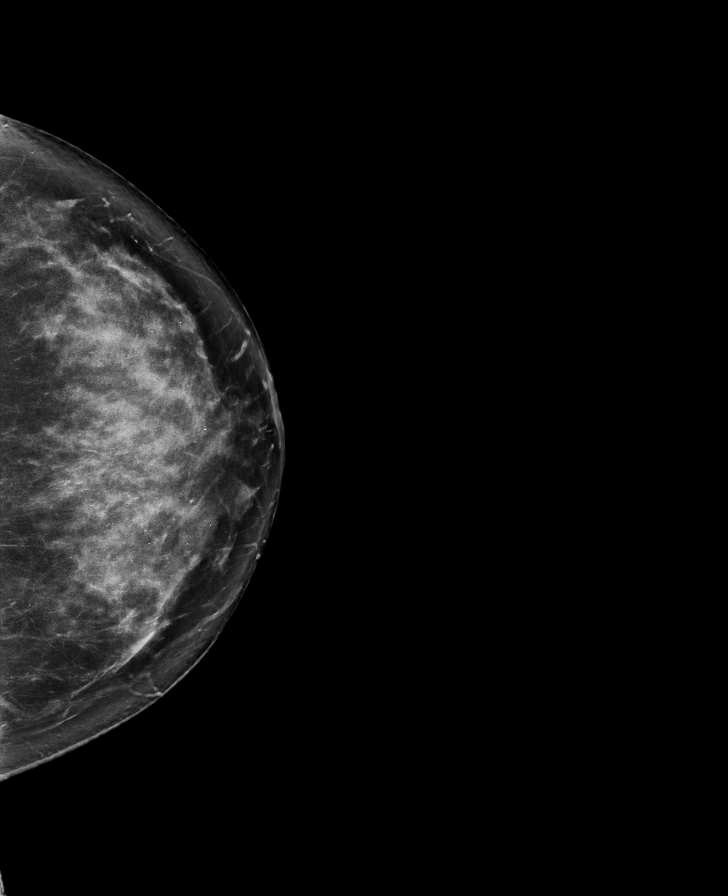

[L MLO synth-2D]
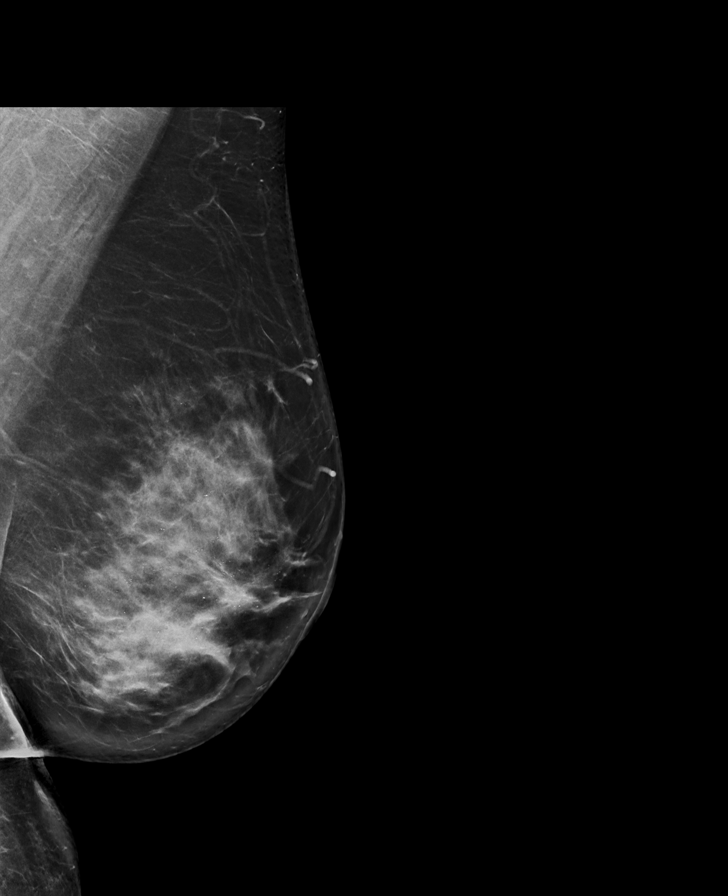

[R CC tomo · tomo slice 39/77.0]
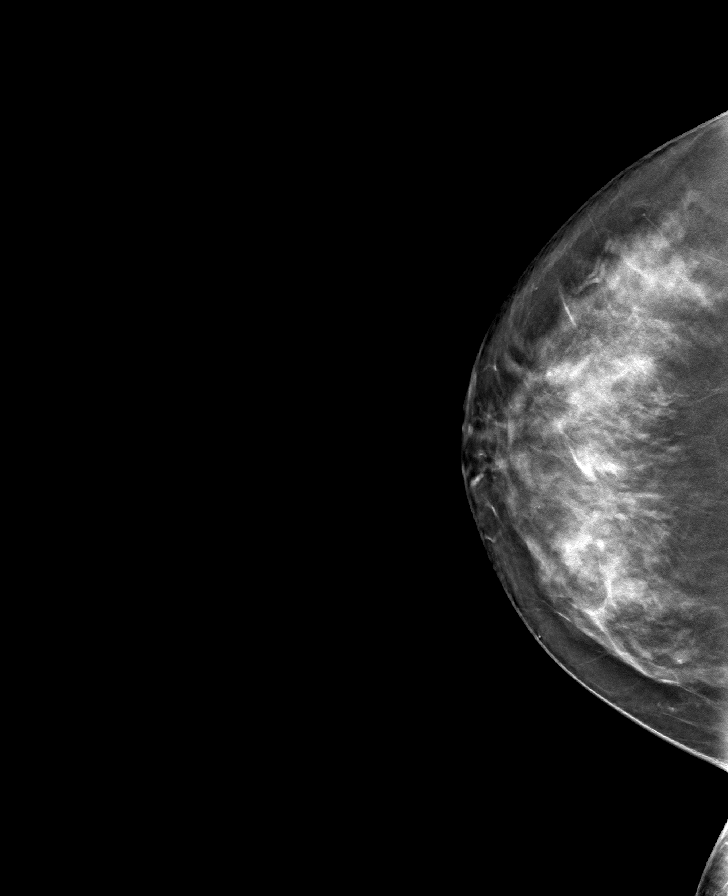

[L CC tomo · tomo slice 42/83.0]
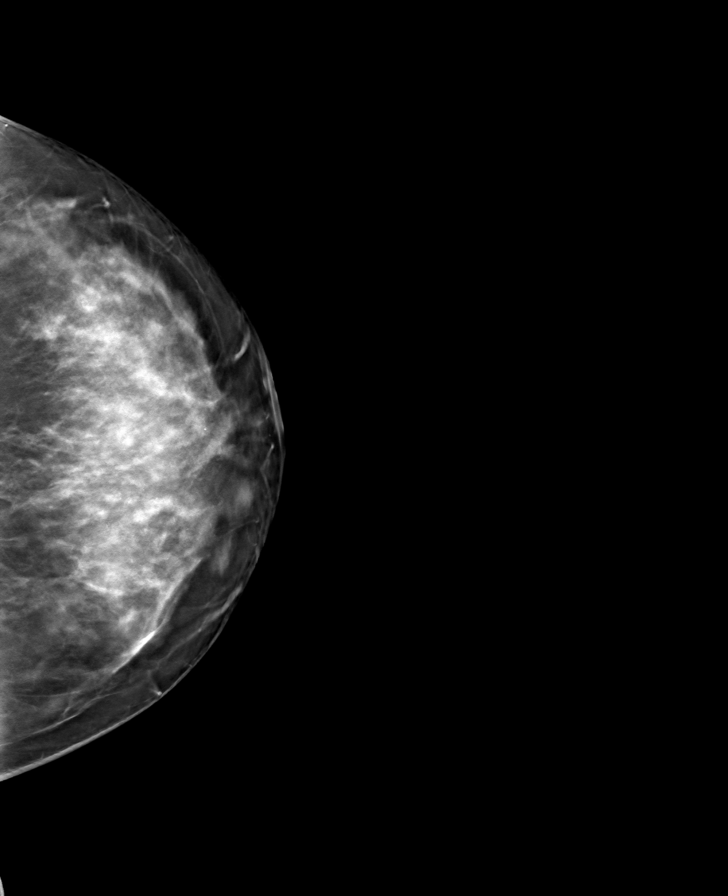

[L MLO tomo · tomo slice 47/92.0]
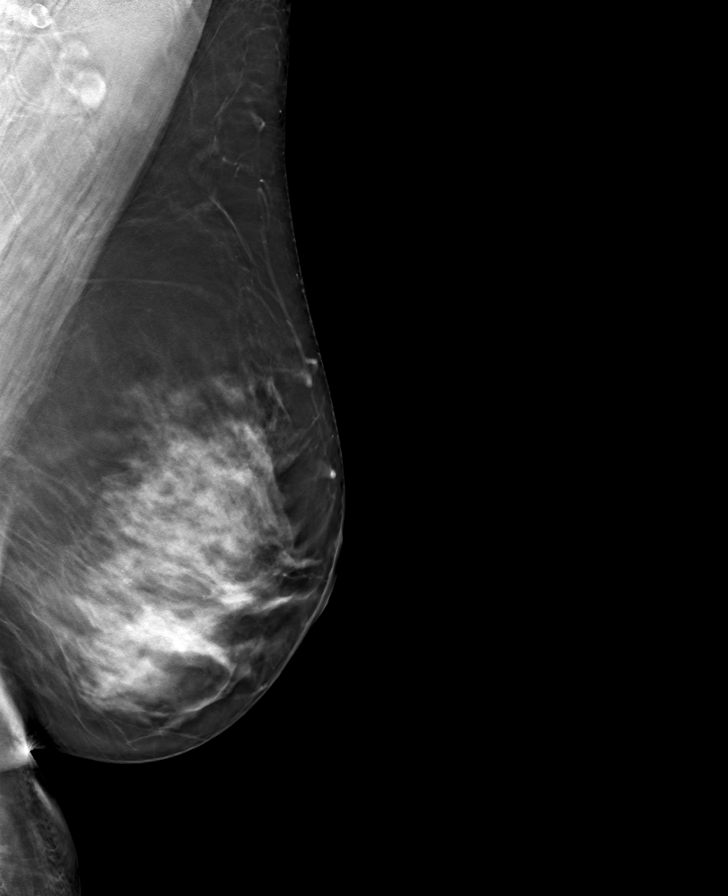

[R MLO tomo · tomo slice 41/80.0]
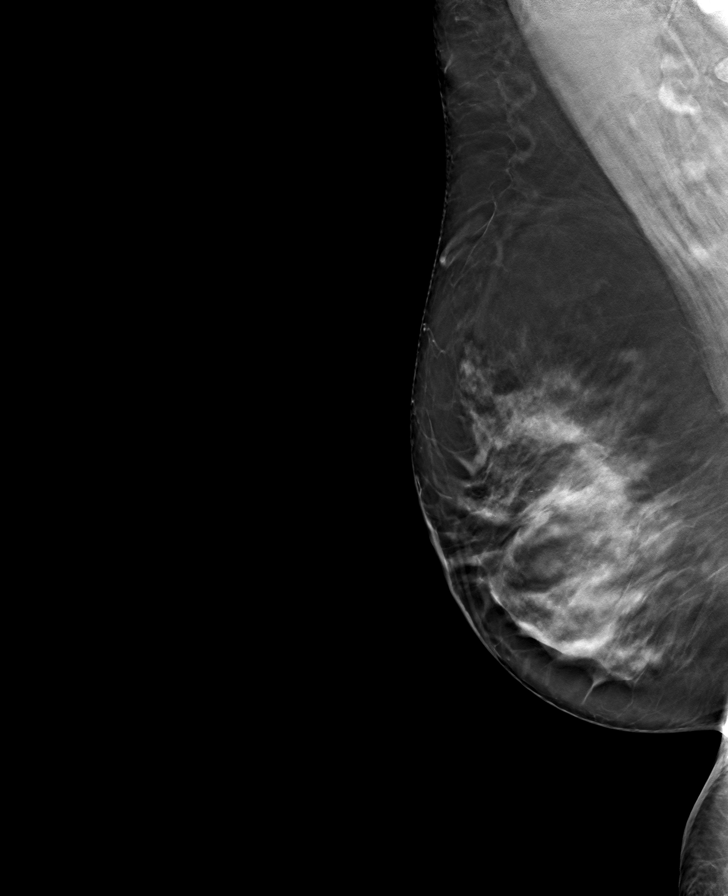

[8 of 24 positions shown; findings below may reference images not displayed]

ACR Breast Density Category d: The breast tissue is extremely dense,
which lowers the sensitivity of mammography
FINDINGS: There are no findings suspicious for malignancy. Images were
processed with CAD.
IMPRESSION: No mammographic evidence of malignancy. A result letter of this
screening mammogram will be mailed directly to the patient.

RECOMMENDATION:
Screening mammogram in one year. (Code:[5I])

BI-RADS CATEGORY  1: Negative.

## 2019-06-16 ENCOUNTER — Ambulatory Visit (INDEPENDENT_AMBULATORY_CARE_PROVIDER_SITE_OTHER): Payer: 59 | Admitting: Family Medicine

## 2019-06-16 ENCOUNTER — Encounter: Payer: Self-pay | Admitting: Family Medicine

## 2019-06-16 ENCOUNTER — Other Ambulatory Visit: Payer: Self-pay

## 2019-06-16 VITALS — BP 122/62 | HR 53 | Temp 97.8°F | Ht 64.0 in | Wt 176.2 lb

## 2019-06-16 DIAGNOSIS — E039 Hypothyroidism, unspecified: Secondary | ICD-10-CM | POA: Diagnosis not present

## 2019-06-16 DIAGNOSIS — Z131 Encounter for screening for diabetes mellitus: Secondary | ICD-10-CM

## 2019-06-16 DIAGNOSIS — Z0000 Encounter for general adult medical examination without abnormal findings: Secondary | ICD-10-CM

## 2019-06-16 DIAGNOSIS — Z23 Encounter for immunization: Secondary | ICD-10-CM | POA: Diagnosis not present

## 2019-06-16 LAB — CBC WITH DIFFERENTIAL/PLATELET
Basophils Absolute: 0 10*3/uL (ref 0.0–0.1)
Basophils Relative: 0.5 % (ref 0.0–3.0)
Eosinophils Absolute: 0.1 10*3/uL (ref 0.0–0.7)
Eosinophils Relative: 1.8 % (ref 0.0–5.0)
HCT: 40 % (ref 36.0–46.0)
Hemoglobin: 13.4 g/dL (ref 12.0–15.0)
Lymphocytes Relative: 32.5 % (ref 12.0–46.0)
Lymphs Abs: 1.7 10*3/uL (ref 0.7–4.0)
MCHC: 33.5 g/dL (ref 30.0–36.0)
MCV: 94.9 fl (ref 78.0–100.0)
Monocytes Absolute: 0.5 10*3/uL (ref 0.1–1.0)
Monocytes Relative: 8.7 % (ref 3.0–12.0)
Neutro Abs: 3 10*3/uL (ref 1.4–7.7)
Neutrophils Relative %: 56.5 % (ref 43.0–77.0)
Platelets: 224 10*3/uL (ref 150.0–400.0)
RBC: 4.22 Mil/uL (ref 3.87–5.11)
RDW: 13.1 % (ref 11.5–15.5)
WBC: 5.4 10*3/uL (ref 4.0–10.5)

## 2019-06-16 LAB — LIPID PANEL
Cholesterol: 221 mg/dL — ABNORMAL HIGH (ref 0–200)
HDL: 58.8 mg/dL (ref >39.00)
LDL Cholesterol: 129 mg/dL — ABNORMAL HIGH (ref 0–99)
NonHDL: 161.86
Total CHOL/HDL Ratio: 4
Triglycerides: 165 mg/dL — ABNORMAL HIGH (ref 0.0–149.0)
VLDL: 33 mg/dL (ref 0.0–40.0)

## 2019-06-16 LAB — BASIC METABOLIC PANEL
BUN: 12 mg/dL (ref 6–23)
CO2: 27 mEq/L (ref 19–32)
Calcium: 9.1 mg/dL (ref 8.4–10.5)
Chloride: 102 mEq/L (ref 96–112)
Creatinine, Ser: 0.67 mg/dL (ref 0.40–1.20)
GFR: 92.79 mL/min (ref >60.00)
Glucose, Bld: 97 mg/dL (ref 70–99)
Potassium: 4.6 mEq/L (ref 3.5–5.1)
Sodium: 138 mEq/L (ref 135–145)

## 2019-06-16 LAB — HEMOGLOBIN A1C: Hgb A1c MFr Bld: 4.9 % (ref 4.6–6.5)

## 2019-06-16 LAB — TSH: TSH: 2.17 u[IU]/mL (ref 0.35–4.50)

## 2019-06-16 LAB — VITAMIN D 25 HYDROXY (VIT D DEFICIENCY, FRACTURES): VITD: 37.15 ng/mL (ref 30.00–100.00)

## 2019-06-16 NOTE — Progress Notes (Signed)
Subjective:     Tara Yates is a 53 y.o. female and is here for a comprehensive physical exam. The patient reports problems - thyroid.  Pt notes increased hair shedding, wt gain.  Taking synthroid 25 mcg daily.  Mammogram done this wk.  Pap scheduled with OB/Gyn.  Influenza vaccine done in Sept.  Pt had shingles.  Inquires about vaccine.  Social History   Socioeconomic History  . Marital status: Married    Spouse name: Not on file  . Number of children: Not on file  . Years of education: Not on file  . Highest education level: Not on file  Occupational History  . Not on file  Tobacco Use  . Smoking status: Former Research scientist (life sciences)  . Smokeless tobacco: Never Used  . Tobacco comment: Quit 20 years ago  Substance and Sexual Activity  . Alcohol use: Yes    Comment: Wine daily  . Drug use: Never  . Sexual activity: Not on file  Other Topics Concern  . Not on file  Social History Narrative  . Not on file   Social Determinants of Health   Financial Resource Strain:   . Difficulty of Paying Living Expenses: Not on file  Food Insecurity:   . Worried About Charity fundraiser in the Last Year: Not on file  . Ran Out of Food in the Last Year: Not on file  Transportation Needs:   . Lack of Transportation (Medical): Not on file  . Lack of Transportation (Non-Medical): Not on file  Physical Activity:   . Days of Exercise per Week: Not on file  . Minutes of Exercise per Session: Not on file  Stress:   . Feeling of Stress : Not on file  Social Connections:   . Frequency of Communication with Friends and Family: Not on file  . Frequency of Social Gatherings with Friends and Family: Not on file  . Attends Religious Services: Not on file  . Active Member of Clubs or Organizations: Not on file  . Attends Archivist Meetings: Not on file  . Marital Status: Not on file  Intimate Partner Violence:   . Fear of Current or Ex-Partner: Not on file  . Emotionally Abused: Not on file   . Physically Abused: Not on file  . Sexually Abused: Not on file   Health Maintenance  Topic Date Due  . HIV Screening  06/10/1983  . PAP SMEAR-Modifier  06/09/1989  . TETANUS/TDAP  03/04/2020  . MAMMOGRAM  06/12/2021  . COLONOSCOPY  07/30/2021  . INFLUENZA VACCINE  Completed    The following portions of the patient's history were reviewed and updated as appropriate: allergies, current medications, past family history, past medical history, past social history, past surgical history and problem list.  Review of Systems Pertinent items noted in HPI and remainder of comprehensive ROS otherwise negative.   Objective:    BP 122/62 (BP Location: Right Arm, Patient Position: Sitting, Cuff Size: Normal)   Pulse (!) 53   Temp 97.8 F (36.6 C) (Temporal)   Ht 5\' 4"  (1.626 m)   Wt 176 lb 3.2 oz (79.9 kg)   SpO2 99%   BMI 30.24 kg/m  General appearance: alert, cooperative and no distress Head: Normocephalic, without obvious abnormality, atraumatic Eyes: conjunctivae/corneas clear. PERRL, EOM's intact. Fundi benign. Ears: normal TM's and external ear canals both ears Nose: Nares normal. Septum midline. Mucosa normal. No drainage or sinus tenderness. Throat: lips, mucosa, and tongue normal; teeth and gums normal  Back: symmetric, no curvature. ROM normal. No CVA tenderness. Lungs: clear to auscultation bilaterally Heart: regular rate and rhythm, S1, S2 normal, no murmur, click, rub or gallop Abdomen: soft, non-tender; bowel sounds normal; no masses,  no organomegaly Extremities: extremities normal, atraumatic, no cyanosis or edema Pulses: 2+ and symmetric Skin: Skin color, texture, turgor normal. No rashes or lesions Lymph nodes: Cervical, supraclavicular, and axillary nodes normal. Neurologic: Alert and oriented X 3, normal strength and tone. Normal symmetric reflexes. Normal coordination and gait    Assessment:    Healthy female exam.      Plan:     Anticipatory guidance  given including wearing seatbelts, smoke detectors in the home, increasing physical activity, increasing p.o. intake of water and vegetables. -will obtain labs -pap, mammogram, colonoscopy up to date -given handouts -next CPE in 1 yr See After Visit Summary for Counseling Recommendations    Acquired hypothyroidism  -Continue Synthroid 25 mcg daily - Plan: TSH, Lipid Panel, Vitamin D, 25-hydroxy  Screening for diabetes mellitus  - Plan: Hemoglobin A1c  Need for shingles vaccine -h/o shingles -zoster vac given this visit  F/u in 6 months, sooner if needed  Grier Mitts, MD  This note is not being shared with the patient for the following reason: To prevent harm (release of this note would result in harm to the life or physical safety of the patient or another).

## 2019-06-16 NOTE — Patient Instructions (Signed)
Preventive Care 51-51 Years Old, Female Preventive care refers to visits with your health care provider and lifestyle choices that can promote health and wellness. This includes:  A yearly physical exam. This may also be called an annual well check.  Regular dental visits and eye exams.  Immunizations.  Screening for certain conditions.  Healthy lifestyle choices, such as eating a healthy diet, getting regular exercise, not using drugs or products that contain nicotine and tobacco, and limiting alcohol use. What can I expect for my preventive care visit? Physical exam Your health care provider will check your:  Height and weight. This may be used to calculate body mass index (BMI), which tells if you are at a healthy weight.  Heart rate and blood pressure.  Skin for abnormal spots. Counseling Your health care provider may ask you questions about your:  Alcohol, tobacco, and drug use.  Emotional well-being.  Home and relationship well-being.  Sexual activity.  Eating habits.  Work and work environment.  Method of birth control.  Menstrual cycle.  Pregnancy history. What immunizations do I need?  Influenza (flu) vaccine  This is recommended every year. Tetanus, diphtheria, and pertussis (Tdap) vaccine  You may need a Td booster every 10 years. Varicella (chickenpox) vaccine  You may need this if you have not been vaccinated. Zoster (shingles) vaccine  You may need this after age 51. Measles, mumps, and rubella (MMR) vaccine  You may need at least one dose of MMR if you were born in 1957 or later. You may also need a second dose. Pneumococcal conjugate (PCV13) vaccine  You may need this if you have certain conditions and were not previously vaccinated. Pneumococcal polysaccharide (PPSV23) vaccine  You may need one or two doses if you smoke cigarettes or if you have certain conditions. Meningococcal conjugate (MenACWY) vaccine  You may need this if you  have certain conditions. Hepatitis A vaccine  You may need this if you have certain conditions or if you travel or work in places where you may be exposed to hepatitis A. Hepatitis B vaccine  You may need this if you have certain conditions or if you travel or work in places where you may be exposed to hepatitis B. Haemophilus influenzae type b (Hib) vaccine  You may need this if you have certain conditions. Human papillomavirus (HPV) vaccine  If recommended by your health care provider, you may need three doses over 6 months. You may receive vaccines as individual doses or as more than one vaccine together in one shot (combination vaccines). Talk with your health care provider about the risks and benefits of combination vaccines. What tests do I need? Blood tests  Lipid and cholesterol levels. These may be checked every 5 years, or more frequently if you are over 51 years old.  Hepatitis C test.  Hepatitis B test. Screening  Lung cancer screening. You may have this screening every year starting at age 51 if you have a 30-pack-year history of smoking and currently smoke or have quit within the past 15 years.  Colorectal cancer screening. All adults should have this screening starting at age 51 and continuing until age 75. Your health care provider may recommend screening at age 51 if you are at increased risk. You will have tests every 1-10 years, depending on your results and the type of screening test.  Diabetes screening. This is done by checking your blood sugar (glucose) after you have not eaten for a while (fasting). You may have this   done every 1-3 years.  Mammogram. This may be done every 1-2 years. Talk with your health care provider about when you should start having regular mammograms. This may depend on whether you have a family history of breast cancer.  BRCA-related cancer screening. This may be done if you have a family history of breast, ovarian, tubal, or peritoneal  cancers.  Pelvic exam and Pap test. This may be done every 3 years starting at age 51. Starting at age 51, this may be done every 5 years if you have a Pap test in combination with an HPV test. Other tests  Sexually transmitted disease (STD) testing.  Bone density scan. This is done to screen for osteoporosis. You may have this scan if you are at high risk for osteoporosis. Follow these instructions at home: Eating and drinking  Eat a diet that includes fresh fruits and vegetables, whole grains, lean protein, and low-fat dairy.  Take vitamin and mineral supplements as recommended by your health care provider.  Do not drink alcohol if: ? Your health care provider tells you not to drink. ? You are pregnant, may be pregnant, or are planning to become pregnant.  If you drink alcohol: ? Limit how much you have to 0-1 drink a day. ? Be aware of how much alcohol is in your drink. In the [place] one drink equals one 12 oz bottle of beer (355 mL), one 5 oz glass of wine (148 mL), or one 1 oz glass of hard liquor (44 mL). Lifestyle  Take daily care of your teeth and gums.  Stay active. Exercise for at least 30 minutes on 5 or more days each week.  Do not use any products that contain nicotine or tobacco, such as cigarettes, e-cigarettes, and chewing tobacco. If you need help quitting, ask your health care provider.  If you are sexually active, practice safe sex. Use a condom or other form of birth control (contraception) in order to prevent pregnancy and STIs (sexually transmitted infections).  If told by your health care provider, take low-dose aspirin daily starting at age 51. What's next?  Visit your health care provider once a year for a well check visit.  Ask your health care provider how often you should have your eyes and teeth checked.  Stay up to date on all vaccines. This information is not intended to replace advice given to you by your health care provider. Make sure you  discuss any questions you have with your health care provider. Document Released: 07/20/2015 Document Revised: 03/04/2018 Document Reviewed: 03/04/2018 Elsevier Patient Education  [place].  Hypothyroidism  Hypothyroidism is when the thyroid gland does not make enough of certain hormones (it is underactive). The thyroid gland is a small gland located in the lower front part of the neck, just in front of the windpipe (trachea). This gland makes hormones that help control how the body uses food for energy (metabolism) as well as how the heart and brain function. These hormones also play a role in keeping your bones strong. When the thyroid is underactive, it produces too little of the hormones thyroxine (T4) and triiodothyronine (T3). What are the causes? This condition may be caused by:  Hashimoto's disease. This is a disease in which the body's disease-fighting system (immune system) attacks the thyroid gland. This is the most common cause.  Viral infections.  Pregnancy.  Certain medicines.  Birth defects.  Past radiation treatments to the head or neck for cancer.  Past treatment with  radioactive iodine.  Past exposure to radiation in the environment.  Past surgical removal of part or all of the thyroid.  Problems with a gland in the center of the brain (pituitary gland).  Lack of enough iodine in the diet. What increases the risk? You are more likely to develop this condition if:  You are female.  You have a family history of thyroid conditions.  You use a medicine called lithium.  You take medicines that affect the immune system (immunosuppressants). What are the signs or symptoms? Symptoms of this condition include:  Feeling as though you have no energy (lethargy).  Not being able to tolerate cold.  Weight gain that is not explained by a change in diet or exercise habits.  Lack of appetite.  Dry skin.  Coarse hair.  Menstrual irregularity.   Slowing of thought processes.  Constipation.  Sadness or depression. How is this diagnosed? This condition may be diagnosed based on:  Your symptoms, your medical history, and a physical exam.  Blood tests. You may also have imaging tests, such as an ultrasound or MRI. How is this treated? This condition is treated with medicine that replaces the thyroid hormones that your body does not make. After you begin treatment, it may take several weeks for symptoms to go away. Follow these instructions at home:  Take over-the-counter and prescription medicines only as told by your health care provider.  If you start taking any new medicines, tell your health care provider.  Keep all follow-up visits as told by your health care provider. This is important. ? As your condition improves, your dosage of thyroid hormone medicine may change. ? You will need to have blood tests regularly so that your health care provider can monitor your condition. Contact a health care provider if:  Your symptoms do not get better with treatment.  You are taking thyroid replacement medicine and you: ? Sweat a lot. ? Have tremors. ? Feel anxious. ? Lose weight rapidly. ? Cannot tolerate heat. ? Have emotional swings. ? Have diarrhea. ? Feel weak. Get help right away if you have:  Chest pain.  An irregular heartbeat.  A rapid heartbeat.  Difficulty breathing. Summary  Hypothyroidism is when the thyroid gland does not make enough of certain hormones (it is underactive).  When the thyroid is underactive, it produces too little of the hormones thyroxine (T4) and triiodothyronine (T3).  The most common cause is Hashimoto's disease, a disease in which the body's disease-fighting system (immune system) attacks the thyroid gland. The condition can also be caused by viral infections, medicine, pregnancy, or past radiation treatment to the head or neck.  Symptoms may include weight gain, dry skin,  constipation, feeling as though you do not have energy, and not being able to tolerate cold.  This condition is treated with medicine to replace the thyroid hormones that your body does not make. This information is not intended to replace advice given to you by your health care provider. Make sure you discuss any questions you have with your health care provider. Document Released: 06/23/2005 Document Revised: 06/05/2017 Document Reviewed: 06/03/2017 Elsevier Patient Education  [place]. Recombinant Zoster (Shingles) Vaccine: What You Need to Know 1. Why get vaccinated? Recombinant zoster (shingles) vaccine can prevent shingles. Shingles (also called herpes zoster, or just zoster) is a painful skin rash, usually with blisters. In addition to the rash, shingles can cause fever, headache, chills, or upset stomach. More rarely, shingles can lead to pneumonia, hearing problems,  blindness, brain inflammation (encephalitis), or death. The most common complication of shingles is long-term nerve pain called postherpetic neuralgia (PHN). PHN occurs in the areas where the shingles rash was, even after the rash clears up. It can last for months or years after the rash goes away. The pain from PHN can be severe and debilitating. About 10 to 18% of people who get shingles will experience PHN. The risk of PHN increases with age. An older adult with shingles is more likely to develop PHN and have longer lasting and more severe pain than a younger person with shingles. Shingles is caused by the varicella zoster virus, the same virus that causes chickenpox. After you have chickenpox, the virus stays in your body and can cause shingles later in life. Shingles cannot be passed from one person to another, but the virus that causes shingles can spread and cause chickenpox in someone who had never had chickenpox or received chickenpox vaccine. 2. Recombinant shingles vaccine Recombinant shingles vaccine provides  strong protection against shingles. By preventing shingles, recombinant shingles vaccine also protects against PHN. Recombinant shingles vaccine is the preferred vaccine for the prevention of shingles. However, a different vaccine, live shingles vaccine, may be used in some circumstances. The recombinant shingles vaccine is recommended for adults 50 years and older without serious immune problems. It is given as a two-dose series. This vaccine is also recommended for people who have already gotten another type of shingles vaccine, the live shingles vaccine. There is no live virus in this vaccine. Shingles vaccine may be given at the same time as other vaccines. 3. Talk with your health care provider Tell your vaccine provider if the person getting the vaccine:  Has had an allergic reaction after a previous dose of recombinant shingles vaccine, or has any severe, life-threatening allergies.  Is pregnant or breastfeeding.  Is currently experiencing an episode of shingles. In some cases, your health care provider may decide to postpone shingles vaccination to a future visit. People with minor illnesses, such as a cold, may be vaccinated. People who are moderately or severely ill should usually wait until they recover before getting recombinant shingles vaccine. Your health care provider can give you more information. 4. Risks of a vaccine reaction  A sore arm with mild or moderate pain is very common after recombinant shingles vaccine, affecting about 80% of vaccinated people. Redness and swelling can also happen at the site of the injection.  Tiredness, muscle pain, headache, shivering, fever, stomach pain, and nausea happen after vaccination in more than half of people who receive recombinant shingles vaccine. In clinical trials, about 1 out of 6 people who got recombinant zoster vaccine experienced side effects that prevented them from doing regular activities. Symptoms usually went away on  their own in 2 to 3 days. You should still get the second dose of recombinant zoster vaccine even if you had one of these reactions after the first dose. People sometimes faint after medical procedures, including vaccination. Tell your provider if you feel dizzy or have vision changes or ringing in the ears. As with any medicine, there is a very remote chance of a vaccine causing a severe allergic reaction, other serious injury, or death. 5. What if there is a serious problem? An allergic reaction could occur after the vaccinated person leaves the clinic. If you see signs of a severe allergic reaction (hives, swelling of the face and throat, difficulty breathing, a fast heartbeat, dizziness, or weakness), call 9-1-1 and get the  person to the nearest hospital. For other signs that concern you, call your health care provider. Adverse reactions should be reported to the Vaccine Adverse Event Reporting System (VAERS). Your health care provider will usually file this report, or you can do it yourself. Visit the VAERS website at www.vaers.SamedayNews.es or call 480 213 5032. VAERS is only for reporting reactions, and VAERS staff do not give medical advice. 6. How can I learn more?  Ask your health care provider.  Call your local or state health department.  Contact the Centers for Disease Control and Prevention (CDC): ? Call (505)873-7845 (1-800-CDC-INFO) or ? Visit CDC's website at http://hunter.com/ Vaccine Information Statement Recombinant Zoster Vaccine (05/05/2018) This information is not intended to replace advice given to you by your health care provider. Make sure you discuss any questions you have with your health care provider. Document Released: 09/02/2016 Document Revised: 10/12/2018 Document Reviewed: 01/27/2018 Elsevier Patient Education  2020 [employer].

## 2019-12-08 ENCOUNTER — Other Ambulatory Visit: Payer: Self-pay | Admitting: Podiatry

## 2019-12-08 ENCOUNTER — Other Ambulatory Visit: Payer: Self-pay

## 2019-12-08 ENCOUNTER — Ambulatory Visit (INDEPENDENT_AMBULATORY_CARE_PROVIDER_SITE_OTHER): Payer: 59 | Admitting: Podiatry

## 2019-12-08 ENCOUNTER — Encounter: Payer: Self-pay | Admitting: Podiatry

## 2019-12-08 ENCOUNTER — Ambulatory Visit (INDEPENDENT_AMBULATORY_CARE_PROVIDER_SITE_OTHER): Payer: 59

## 2019-12-08 VITALS — BP 120/74 | HR 67

## 2019-12-08 DIAGNOSIS — M722 Plantar fascial fibromatosis: Secondary | ICD-10-CM

## 2019-12-08 DIAGNOSIS — M79672 Pain in left foot: Secondary | ICD-10-CM

## 2019-12-08 DIAGNOSIS — M79671 Pain in right foot: Secondary | ICD-10-CM | POA: Diagnosis not present

## 2019-12-08 NOTE — Patient Instructions (Signed)

## 2019-12-13 NOTE — Progress Notes (Signed)
Subjective:   Patient ID: Tara Yates, female   DOB: 54 y.o.   MRN: [id number]   HPI 53 year old female presents the office today for concerns of bilateral heel pain with right side worse than left.  She states that ongoing last several years but seems to be getting worse.  She states that she has pain in the morning after periods of rest.  No recent injury or falls or any weakness.  No swelling.  No other concerns today.   Review of Systems  All other systems reviewed and are negative.  Past Medical History:  Diagnosis Date  . Allergy   . Anxiety   . Depression   . Hyperlipidemia   . Hypothyroidism     Past Surgical History:  Procedure Laterality Date  . BREAST BIOPSY Right 02/28/2015  . BREAST EXCISIONAL BIOPSY Right 03/29/2015   had seed placement as wel  . ENDOMETRIAL ABLATION    . MOUTH SURGERY    . RADIOACTIVE SEED GUIDED EXCISIONAL BREAST BIOPSY Right 04/03/2015   Procedure: RADIOACTIVE SEED GUIDED EXCISIONAL BREAST BIOPSY;  Surgeon: Rolm Bookbinder, MD;  Location: [facility];  Service: General;  Laterality: Right;     Current Outpatient Medications:  .  co-enzyme Q-10 30 MG capsule, Take by mouth., Disp: , Rfl:  .  cyclobenzaprine (FLEXERIL) 10 MG tablet, SMARTSIG:0.5-1 Tablet(s) By Mouth 2-3 Times Daily PRN, Disp: , Rfl:  .  escitalopram (LEXAPRO) 10 MG tablet, Take 10 mg by mouth daily., Disp: , Rfl:  .  Estradiol 10 MCG TABS vaginal tablet, Imvexxy Maintenance Pack 10 mcg vaginal insert  place 1 insert in vagina every other day for 10 days then twice weekly, Disp: , Rfl:  .  Lactic Ac-Citric Ac-Pot Bitart (PHEXXI) 1.8-1-0.4 % GEL, Phexxi 1.8 %-1 %-0.4 % vaginal gel  PLACE 1 APPLICATION OF PHEXXI IN VAGINA UP TO 1 HOUR BEFORE INTERCOURSE., Disp: , Rfl:  .  levothyroxine (SYNTHROID) 25 MCG tablet, Take 25 mcg by mouth daily before breakfast. , Disp: , Rfl:  .  meloxicam (MOBIC) 15 MG tablet, meloxicam 15 mg tablet  TAKE 1 TABLET BY MOUTH EVERY  DAY, Disp: , Rfl:  .  Multiple Vitamin (MULTIVITAMIN) tablet, Take 1 tablet by mouth daily., Disp: , Rfl:  .  norethindrone-ethinyl estradiol-iron (MICROGESTIN FE,GILDESS FE,LOESTRIN FE) 1.5-30 MG-MCG tablet, Take 1 tablet by mouth daily., Disp: , Rfl:  .  psyllium (METAMUCIL) 58.6 % packet, Take by mouth., Disp: , Rfl:   No Known Allergies        Objective:  Physical Exam  General: AAO x3, NAD  Dermatological: Skin is warm, dry and supple bilateral. Nails x 10 are well manicured; remaining integument appears unremarkable at this time. There are no open sores, no preulcerative lesions, no rash or signs of infection present.  Vascular: Dorsalis Pedis artery and Posterior Tibial artery pedal pulses are 2/4 bilateral with immedate capillary fill time. Pedal hair growth present. No varicosities and no lower extremity edema present bilateral. There is no pain with calf compression, swelling, warmth, erythema.   Neruologic: Grossly intact via light touch bilateral. Negative tinel sign  Musculoskeletal: Tenderness to palpation along the plantar medial tubercle of the calcaneus at the insertion of plantar fascia on the right >left foot. There is no pain along the course of the plantar fascia within the arch of the foot. Plantar fascia appears to be intact. There is no pain with lateral compression of the calcaneus or pain with vibratory sensation. There is no  pain along the course or insertion of the achilles tendon. No other areas of tenderness to bilateral lower extremities.Muscular strength 5/5 in all groups tested bilateral.  Gait: Unassisted, Nonantalgic.       Assessment:   Bilateral plantar fasciitis right side worse than left    Plan:  -Treatment options discussed including all alternatives, risks, and complications -Etiology of symptoms were discussed -X-rays were obtained and reviewed with the patient.  No evidence of acute fracture or stress fracture identified today. -Discussed  steroid injection. -Prescribed mobic. Discussed side effects of the medication and directed to stop if any are to occur and call the office.  -Plantar fascial braces -Stretching, icing exercises daily.  Discussed shoe modifications and orthotics.  Return in about 3 weeks (around 12/29/2019).  Trula Slade DPM

## 2019-12-26 ENCOUNTER — Encounter: Payer: Self-pay | Admitting: Family Medicine

## 2019-12-29 ENCOUNTER — Other Ambulatory Visit: Payer: Self-pay

## 2019-12-29 ENCOUNTER — Ambulatory Visit (INDEPENDENT_AMBULATORY_CARE_PROVIDER_SITE_OTHER): Payer: 59 | Admitting: Podiatry

## 2019-12-29 ENCOUNTER — Ambulatory Visit: Payer: 59 | Admitting: Podiatry

## 2019-12-29 DIAGNOSIS — M722 Plantar fascial fibromatosis: Secondary | ICD-10-CM

## 2020-01-04 DIAGNOSIS — M722 Plantar fascial fibromatosis: Secondary | ICD-10-CM | POA: Insufficient documentation

## 2020-01-04 NOTE — Progress Notes (Signed)
Subjective: 54 year old female presents the office today for follow-up evaluation of bilateral heel pain. She states the injections helped a lot she is having much improvement in her symptoms but still in some discomfort on the right side. She has been using a plantar fascial brace meloxicam, night splint as well as stretching, icing daily. She is going to [place] next week possibly to pursue another injection on the right side. Denies any systemic complaints such as fevers, chills, nausea, vomiting. No acute changes since last appointment, and no other complaints at this time.   Objective: AAO x3, NAD DP/PT pulses palpable bilaterally, CRT less than 3 seconds There is improved but still tenderness to palpation along the plantar medial tubercle of the calcaneus at the insertion of plantar fascia on the right foot. Currently no pain on the left side. There is no pain along the course of the plantar fascia within the arch of the foot. Plantar fascia appears to be intact. There is no pain with lateral compression of the calcaneus or pain with vibratory sensation. There is no pain along the course or insertion of the achilles tendon. No other areas of tenderness to bilateral lower extremities. No pain with calf compression, swelling, warmth, erythema  Assessment: Plantar fasciitis right >> left  Plan: -All treatment options discussed with the patient including all alternatives, risks, complications.  -Steroid injection performed in the right side. See procedure note below. Continue stretching, icing, bracing as well as wearing supportive shoes daily. -Patient encouraged to call the office with any questions, concerns, change in symptoms.   Procedure: Injection Tendon/Ligament Discussed alternatives, risks, complications and verbal consent was obtained.  Location: Right plantar fascia at the glabrous junction; medial approach. Skin Prep: Alcohol Injectate: 0.5cc 0.5% marcaine plain, 0.5 cc 2%  lidocaine plain and, 1 cc kenalog 10. Disposition: Patient tolerated procedure well. Injection site dressed with a band-aid.  Post-injection care was discussed and return precautions discussed.   No follow-ups on file.  Trula Slade DPM

## 2020-01-26 ENCOUNTER — Ambulatory Visit: Payer: 59 | Admitting: Podiatry

## 2020-03-08 ENCOUNTER — Encounter: Payer: Self-pay | Admitting: Family Medicine

## 2020-03-15 ENCOUNTER — Other Ambulatory Visit: Payer: Self-pay

## 2020-03-15 ENCOUNTER — Telehealth: Payer: Self-pay | Admitting: Family Medicine

## 2020-03-15 ENCOUNTER — Ambulatory Visit: Payer: 59 | Admitting: Podiatry

## 2020-03-15 ENCOUNTER — Encounter: Payer: Self-pay | Admitting: Podiatry

## 2020-03-15 DIAGNOSIS — M722 Plantar fascial fibromatosis: Secondary | ICD-10-CM

## 2020-03-15 NOTE — Telephone Encounter (Signed)
Pt said she has not received her medication   levothyroxine (SYNTHROID) 25 MCG tablet   Through mail order with [facility] yet and she will run out by weekend. She is wanting a prescription to be sent to   [facility] AT [facility] [address]  [place], [place] [address]  Phone:  [phone] Fax:  [phone]   So that she may have a supply and then worry about the mail order   Please advise

## 2020-03-16 ENCOUNTER — Other Ambulatory Visit: Payer: Self-pay | Admitting: *Deleted

## 2020-03-16 ENCOUNTER — Telehealth: Payer: Self-pay

## 2020-03-16 DIAGNOSIS — E039 Hypothyroidism, unspecified: Secondary | ICD-10-CM

## 2020-03-16 MED ORDER — LEVOTHYROXINE SODIUM 25 MCG PO TABS: 25.0000 ug | ORAL_TABLET | Freq: Every day | ORAL | 0 refills | Status: DC

## 2020-03-16 NOTE — Telephone Encounter (Signed)
RX sent to pharmacy requested

## 2020-03-16 NOTE — Telephone Encounter (Signed)
Pt was in the office yesterday and wanted a prescription for anti-flammatory. Pt wasn't given the order and wanted to follow up on this. Please advise.

## 2020-03-16 NOTE — Progress Notes (Signed)
Subjective:   Patient ID: Tara Yates, female   DOB: 54 y.o.   MRN: [id number]   HPI Patient states she is still getting severe discomfort now in the right heel.  States she had a short period of relief but it is really not been better and she has had years of issues with this but over the last 6 months intensification of discomfort.  She does have a night splint and is trying to wear supportive shoe gear but is not able to do the exercise she wants   ROS      Objective:  Physical Exam  Neurovascular status intact with severe discomfort of the plantar fascia at its insertion into the calcaneus right with inflammation fluid of the insertional point of the tendon.  Patient is walking with in a propulsive gait      Assessment:  Acute plantar fasciitis right with inflammation fluid of the medial band     Plan:  H&P reviewed more aggressive conservative plan.  We discussed complete immobilization versus possibility for shockwave versus ultimate surgery including probable gastroc.  At this point I did go ahead and initiate conservative aggressive treatment and I did sterile prep and reinjected the fascia at insertion 3 mg Kenalog 5 mg Xylocaine and went ahead and applied air fracture walker to completely immobilize and take all stress off the heel and advised on heat ice therapy stretching exercises and then gradual reduction of the boot.  If symptoms persist will need to consider another more aggressive treatment option for this patient long-term

## 2020-03-19 NOTE — Telephone Encounter (Signed)
Can have diclofenac 75mg . Bid #50 with 2 refills

## 2020-03-20 ENCOUNTER — Other Ambulatory Visit: Payer: Self-pay

## 2020-03-20 MED ORDER — DICLOFENAC SODIUM 75 MG PO TBEC
75.0000 mg | DELAYED_RELEASE_TABLET | Freq: Two times a day (BID) | ORAL | 2 refills | Status: DC
Start: 2020-03-20 — End: 2020-06-11

## 2020-03-20 NOTE — Telephone Encounter (Signed)
See phone note

## 2020-03-20 NOTE — Telephone Encounter (Signed)
Medication has been ordered.

## 2020-04-12 ENCOUNTER — Other Ambulatory Visit: Payer: Self-pay

## 2020-04-12 ENCOUNTER — Ambulatory Visit: Payer: 59 | Admitting: Podiatry

## 2020-04-12 ENCOUNTER — Encounter: Payer: Self-pay | Admitting: Podiatry

## 2020-04-12 DIAGNOSIS — M722 Plantar fascial fibromatosis: Secondary | ICD-10-CM | POA: Diagnosis not present

## 2020-04-14 NOTE — Progress Notes (Signed)
Subjective:   Patient ID: Tara Yates, female   DOB: 54 y.o.   MRN: [id number]   HPI Patient states she is doing some better and states the boot does seem to help but she is not sure whether or not she can do well without the boot and has not really tested it and still does have pain when she does not have the boot on   ROS      Objective:  Physical Exam  Neurovascular status intact with patient's right heel still very tender at the medial band insertional point tendon calcaneus with chronic plantar fascial inflammation present     Assessment:  Chronic plantar fasciitis right that were hoping has improved with immobilization aggressive conservative treatment     Plan:  H&P reviewed condition at great length.  At this point I do think that the immobilization has been of benefit but I explained we cannot do this for ever ever getting need to consider other treatments if symptoms were to come back or develop.  At this point I went ahead and I discussed that with her and at this point I am going to allow her to return to normal activities she can use topical medicines oral medicines and boot as needed and depending on response we will decide on whether or not this will require surgical intervention or other types of invasive treatment

## 2020-05-15 ENCOUNTER — Encounter (INDEPENDENT_AMBULATORY_CARE_PROVIDER_SITE_OTHER): Payer: Self-pay | Admitting: Family Medicine

## 2020-05-15 ENCOUNTER — Ambulatory Visit (INDEPENDENT_AMBULATORY_CARE_PROVIDER_SITE_OTHER): Payer: 59 | Admitting: Family Medicine

## 2020-05-15 ENCOUNTER — Other Ambulatory Visit: Payer: Self-pay

## 2020-05-15 VITALS — BP 119/81 | HR 90 | Temp 98.2°F | Ht 64.0 in | Wt 184.0 lb

## 2020-05-15 DIAGNOSIS — R0602 Shortness of breath: Secondary | ICD-10-CM | POA: Insufficient documentation

## 2020-05-15 DIAGNOSIS — Z9189 Other specified personal risk factors, not elsewhere classified: Secondary | ICD-10-CM | POA: Diagnosis not present

## 2020-05-15 DIAGNOSIS — R5383 Other fatigue: Secondary | ICD-10-CM | POA: Diagnosis not present

## 2020-05-15 DIAGNOSIS — F3289 Other specified depressive episodes: Secondary | ICD-10-CM | POA: Diagnosis not present

## 2020-05-15 DIAGNOSIS — E038 Other specified hypothyroidism: Secondary | ICD-10-CM

## 2020-05-15 DIAGNOSIS — Z6831 Body mass index (BMI) 31.0-31.9, adult: Secondary | ICD-10-CM

## 2020-05-15 DIAGNOSIS — F411 Generalized anxiety disorder: Secondary | ICD-10-CM

## 2020-05-15 DIAGNOSIS — Z8719 Personal history of other diseases of the digestive system: Secondary | ICD-10-CM | POA: Insufficient documentation

## 2020-05-15 DIAGNOSIS — E669 Obesity, unspecified: Secondary | ICD-10-CM

## 2020-05-15 DIAGNOSIS — E7849 Other hyperlipidemia: Secondary | ICD-10-CM | POA: Diagnosis not present

## 2020-05-15 DIAGNOSIS — E785 Hyperlipidemia, unspecified: Secondary | ICD-10-CM | POA: Insufficient documentation

## 2020-05-15 DIAGNOSIS — F32A Depression, unspecified: Secondary | ICD-10-CM | POA: Insufficient documentation

## 2020-05-15 DIAGNOSIS — Z0289 Encounter for other administrative examinations: Secondary | ICD-10-CM

## 2020-05-16 LAB — HEMOGLOBIN A1C
Est. average glucose Bld gHb Est-mCnc: 105 mg/dL
Hgb A1c MFr Bld: 5.3 % (ref 4.8–5.6)

## 2020-05-16 LAB — CBC WITH DIFFERENTIAL/PLATELET
Basophils Absolute: 0.1 10*3/uL (ref 0.0–0.2)
Basos: 1 %
EOS (ABSOLUTE): 0.1 10*3/uL (ref 0.0–0.4)
Eos: 2 %
Hematocrit: 41.3 % (ref 34.0–46.6)
Hemoglobin: 14.1 g/dL (ref 11.1–15.9)
Immature Grans (Abs): 0 10*3/uL (ref 0.0–0.1)
Immature Granulocytes: 0 %
Lymphocytes Absolute: 1.5 10*3/uL (ref 0.7–3.1)
Lymphs: 34 %
MCH: 31.2 pg (ref 26.6–33.0)
MCHC: 34.1 g/dL (ref 31.5–35.7)
MCV: 91 fL (ref 79–97)
Monocytes Absolute: 0.4 10*3/uL (ref 0.1–0.9)
Monocytes: 9 %
Neutrophils Absolute: 2.4 10*3/uL (ref 1.4–7.0)
Neutrophils: 54 %
Platelets: 234 10*3/uL (ref 150–450)
RBC: 4.52 x10E6/uL (ref 3.77–5.28)
RDW: 12.6 % (ref 11.7–15.4)
WBC: 4.5 10*3/uL (ref 3.4–10.8)

## 2020-05-16 LAB — COMPREHENSIVE METABOLIC PANEL
ALT: 27 IU/L (ref 0–32)
AST: 16 IU/L (ref 0–40)
Albumin/Globulin Ratio: 1.8 (ref 1.2–2.2)
Albumin: 4.8 g/dL (ref 3.8–4.9)
Alkaline Phosphatase: 93 IU/L (ref 44–121)
BUN/Creatinine Ratio: 18 (ref 9–23)
BUN: 13 mg/dL (ref 6–24)
Bilirubin Total: 0.4 mg/dL (ref 0.0–1.2)
CO2: 22 mmol/L (ref 20–29)
Calcium: 9.5 mg/dL (ref 8.7–10.2)
Chloride: 100 mmol/L (ref 96–106)
Creatinine, Ser: 0.73 mg/dL (ref 0.57–1.00)
GFR calc Af Amer: 110 mL/min/{1.73_m2} (ref >59)
GFR calc non Af Amer: 96 mL/min/{1.73_m2} (ref >59)
Globulin, Total: 2.7 g/dL (ref 1.5–4.5)
Glucose: 103 mg/dL — ABNORMAL HIGH (ref 65–99)
Potassium: 4.5 mmol/L (ref 3.5–5.2)
Sodium: 140 mmol/L (ref 134–144)
Total Protein: 7.5 g/dL (ref 6.0–8.5)

## 2020-05-16 LAB — VITAMIN B12: Vitamin B-12: 681 pg/mL (ref 232–1245)

## 2020-05-16 LAB — T4, FREE: Free T4: 1.51 ng/dL (ref 0.82–1.77)

## 2020-05-16 LAB — FOLATE: Folate: 15.7 ng/mL (ref >3.0)

## 2020-05-16 LAB — LIPID PANEL
Chol/HDL Ratio: 4 ratio (ref 0.0–4.4)
Cholesterol, Total: 270 mg/dL — ABNORMAL HIGH (ref 100–199)
HDL: 67 mg/dL (ref >39)
LDL Chol Calc (NIH): 186 mg/dL — ABNORMAL HIGH (ref 0–99)
Triglycerides: 99 mg/dL (ref 0–149)
VLDL Cholesterol Cal: 17 mg/dL (ref 5–40)

## 2020-05-16 LAB — TSH: TSH: 2.28 u[IU]/mL (ref 0.450–4.500)

## 2020-05-16 LAB — T3: T3, Total: 99 ng/dL (ref 71–180)

## 2020-05-16 LAB — VITAMIN D 25 HYDROXY (VIT D DEFICIENCY, FRACTURES): Vit D, 25-Hydroxy: 34 ng/mL (ref 30.0–100.0)

## 2020-05-16 LAB — INSULIN, RANDOM: INSULIN: 9.3 u[IU]/mL (ref 2.6–24.9)

## 2020-05-16 NOTE — Progress Notes (Signed)
Chief Complaint:   OBESITY Tara Yates (MR# [id number]) is a 54 y.o. female who presents for evaluation and treatment of obesity and related comorbidities. Current BMI is Body mass index is 31.58 kg/m. Tara Yates has been struggling with her weight for many years and has been unsuccessful in either losing weight, maintaining weight loss, or reaching her healthy weight goal.  Tara Yates is currently in the action stage of change and ready to dedicate time achieving and maintaining a healthier weight. Tara Yates is interested in becoming our patient and working on intensive lifestyle modifications including (but not limited to) diet and exercise for weight loss.  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  She lives with her S.O., Tara Yates, age 2, and 2 children, 61 and 64 years old.  Weight Watchers worked well in the past.  Worst habit is drinking ETOH and steak every other weekend.  Tara Yates's habits were reviewed today and are as follows: Her family eats meals together, she thinks her family will eat healthier with her, her desired weight loss is 71, she has been heavy most of her life, she started gaining weight after age 69, her heaviest weight ever was 182 pounds, she craves salty and crispy foods, she sometimes makes poor food choices, she sometimes eats larger portions than normal and she struggles with emotional eating.  This is the patient's first visit at [facility].  The patient's NEW PATIENT PACKET that they filled out prior to today's office visit was reviewed at length and information from that paperwork was included within the following office visit note.    Included in the packet: current and past health history, medications, allergies, ROS, gynecologic history (women only), surgical history, family history, social history, weight history, weight loss surgery history (for those that have had weight loss surgery), nutritional  evaluation, mood and food questionnaire along with a depression screening (PHQ9) on all patients, an Epworth questionnaire, sleep habits questionnaire, patient life and health improvement goals questionnaire. These will all be scanned into the patient's chart under media.   During the visit, I independently reviewed the patient's EKG, bioimpedance scale results, and indirect calorimeter results. I used this information to tailor a meal plan for the patient that will help Tara Yates to lose weight and will improve her obesity-related conditions going forward.  I performed a medically necessary appropriate examination and/or evaluation. I discussed the assessment and treatment plan with the patient. The patient was provided an opportunity to ask questions and all were answered. The patient agreed with the plan and demonstrated an understanding of the instructions. Labs were ordered today (unless patient declined them) and will be reviewed with the patient at our next visit unless more critical results need to be addressed immediately. Clinical information was updated and documented in the EMR.  Time spent on visit including pre-visit chart review and post-visit care was estimated to be 60 minutes.  A separate 15 minutes was spent on risk counseling (see above/below).   Depression Screen Tara Yates's Food and Mood (modified PHQ-9) score was 18.  Depression screen PHQ 2/9 05/15/2020  Decreased Interest 1  Down, Depressed, Hopeless 3  PHQ - 2 Score 4  Altered sleeping 3  Tired, decreased energy 3  Change in appetite 3  Feeling bad or failure about yourself  3  Trouble concentrating 1  Moving slowly or fidgety/restless 1  Suicidal thoughts 0  PHQ-9 Score 18  Difficult doing  work/chores Not difficult at all   Assessment/Plan:   1. Other fatigue Tara Yates denies daytime somnolence and reports waking up still tired. Patent has a history of symptoms of morning fatigue and snoring. Tara Yates generally gets 5-8 hours of  sleep per night, and states that she has poor quality sleep. Snoring is present. Apneic episodes are not present. Epworth Sleepiness Score is 3.  Tara Yates does feel that her weight is causing her energy to be lower than it should be. Fatigue may be related to obesity, depression or many other causes. Labs will be ordered, and in the meanwhile, [name] will focus on self care including making healthy food choices, increasing physical activity and focusing on stress reduction.  - EKG 12-Lead - Vitamin B12 - CBC with Differential/Platelet - Comprehensive metabolic panel - Folate - Hemoglobin A1c - Insulin, random - Lipid panel - T3 - T4, free - TSH - VITAMIN D 25 Hydroxy (Vit-D Deficiency, Fractures)  2. SOB (shortness of breath) on exertion Tara Yates notes increasing shortness of breath with exercising and seems to be worsening over time with weight gain. She notes getting out of breath sooner with activity than she used to. This has gotten worse recently. Tara Yates denies shortness of breath at rest or orthopnea.  Allan does feel that she gets out of breath more easily that she used to when she exercises. Tara Yates's shortness of breath appears to be obesity related and exercise induced. She has agreed to work on weight loss and gradually increase exercise to treat her exercise induced shortness of breath. Will continue to monitor closely.  - Vitamin B12 - CBC with Differential/Platelet - Comprehensive metabolic panel - Folate - Hemoglobin A1c - Insulin, random - Lipid panel - T3 - T4, free - TSH - VITAMIN D 25 Hydroxy (Vit-D Deficiency, Fractures)  3. Other hyperlipidemia Tara Yates has hyperlipidemia and has been trying to improve her cholesterol levels with intensive lifestyle modification including a low saturated fat diet, exercise and weight loss. She denies any chest pain, claudication or myalgias.  Lab Results  Component Value Date   ALT 27 05/15/2020   AST 16 05/15/2020   ALKPHOS 93 05/15/2020    BILITOT 0.4 05/15/2020   Lab Results  Component Value Date   CHOL 270 (H) 05/15/2020   HDL 67 05/15/2020   LDLCALC 186 (H) 05/15/2020   TRIG 99 05/15/2020   CHOLHDL 4.0 05/15/2020   4. Other specified hypothyroidism Stevey is taking levothyroxine 25 mcg daily.    Patient with long-standing hypothyroidism, on levothyroxine therapy. She appears euthyroid. Orders and follow up as documented in patient record.  Counseling  Good thyroid control is important for overall health. Supratherapeutic thyroid levels are dangerous and will not improve weight loss results.  The correct way to take levothyroxine is fasting, with water, separated by at least 30 minutes from breakfast, and separated by more than 4 hours from calcium, iron, multivitamins, acid reflux medications (PPIs).   Lab Results  Component Value Date   TSH 2.280 05/15/2020   - Vitamin B12 - CBC with Differential/Platelet - Comprehensive metabolic panel - Folate - Hemoglobin A1c - Insulin, random - Lipid panel - T3 - T4, free - TSH - VITAMIN D 25 Hydroxy (Vit-D Deficiency, Fractures)  5. History of constipation Malaney is taking MiraLAX nightly and drinking 100 ounces of water daily.  Plan:  Symptoms are stable currently.  6. GAD (generalized anxiety disorder) Lexapro worked well in the past for her.  Plan:  Calm app daily.  Continue  with thearpist.  7. Other depression, with emotional eating PHQ-9 is 18.  Recently came of Lexapro about 6 months or so ago.  Denies SI/HI.  Her 41 year old daughter has significant psychiatric illness as well and attempted suicide last year.  Plan:  I recommend she contact her PCP regarding getting back on medication.  She goes to a therapist, Elnora Morrison, weekly.  Calm app.  - Vitamin B12 - CBC with Differential/Platelet - Comprehensive metabolic panel - Folate - Hemoglobin A1c - Insulin, random - Lipid panel - T3 - T4, free - TSH - VITAMIN D 25 Hydroxy (Vit-D Deficiency,  Fractures)  8. At risk for heart disease Due to Jewelene's current state of health and medical condition(s), she is at a higher risk for heart disease.   This puts the patient at much greater risk to subsequently develop cardiopulmonary conditions that can significantly affect patient's quality of life in a negative manner as well.    At least 30 minutes was spent on counseling Karlissa about these concerns today and I stressed the importance of reversing risks factors of obesity, esp truncal and visceral fat, hypertension, hyperlipidemia, pre-diabetes.   Initial goal is to lose at least 5-10% of starting weight to help reduce these risk factors.   Counseling: Intensive lifestyle modifications discussed with Bita as most appropriate first line treatment.  she will continue to work on diet, exercise and weight loss efforts.  We will continue to reassess these conditions on a fairly regular basis in an attempt to decrease patient's overall morbidity and mortality.  Evidence-based interventions for health behavior change were utilized today including the discussion of self monitoring techniques, problem-solving barriers and SMART goal setting techniques.  Specifically regarding patient's less desirable eating habits and patterns, we employed the technique of small changes when Daniela has not been able to fully commit to her prudent nutritional plan.  9. Class 1 obesity with serious comorbidity and body mass index (BMI) of 31.0 to 31.9 in adult, unspecified obesity type  Katelind is currently in the action stage of change and her goal is to continue with weight loss efforts. I recommend Kathrina begin the structured treatment plan as follows:  She has agreed to the Category 2 Plan.  Exercise goals: As is.   Behavioral modification strategies: no skipping meals, meal planning and cooking strategies and planning for success.  She was informed of the importance of frequent follow-up visits to maximize her success with  intensive lifestyle modifications for her multiple health conditions. She was informed we would discuss her lab results at her next visit unless there is a critical issue that needs to be addressed sooner. Deysha agreed to keep her next visit at the agreed upon time to discuss these results.  Objective:   Blood pressure 119/81, pulse 90, temperature 98.2 F (36.8 C), height 5\' 4"  (1.626 m), weight 184 lb (83.5 kg), SpO2 97 %. Body mass index is 31.58 kg/m.  EKG: Normal sinus rhythm, rate 69 bpm.  Indirect Calorimeter completed today shows a VO2 of 292 and a REE of 2034.  Her calculated basal metabolic rate is 4128 thus her basal metabolic rate is better than expected.  General: Cooperative, alert, well developed, in no acute distress. HEENT: Conjunctivae and lids unremarkable. Cardiovascular: Regular rhythm.  Lungs: Normal work of breathing. Neurologic: No focal deficits.   Lab Results  Component Value Date   CREATININE 0.73 05/15/2020   BUN 13 05/15/2020   NA 140 05/15/2020   K 4.5 05/15/2020  CL 100 05/15/2020   CO2 22 05/15/2020   Lab Results  Component Value Date   ALT 27 05/15/2020   AST 16 05/15/2020   ALKPHOS 93 05/15/2020   BILITOT 0.4 05/15/2020   Lab Results  Component Value Date   HGBA1C 5.3 05/15/2020   HGBA1C 4.9 06/16/2019   Lab Results  Component Value Date   INSULIN 9.3 05/15/2020   Lab Results  Component Value Date   TSH 2.280 05/15/2020   Lab Results  Component Value Date   CHOL 270 (H) 05/15/2020   HDL 67 05/15/2020   LDLCALC 186 (H) 05/15/2020   TRIG 99 05/15/2020   CHOLHDL 4.0 05/15/2020   Lab Results  Component Value Date   WBC 4.5 05/15/2020   HGB 14.1 05/15/2020   HCT 41.3 05/15/2020   MCV 91 05/15/2020   PLT 234 05/15/2020   Attestation Statements:   Reviewed by clinician on day of visit: allergies, medications, problem list, medical history, surgical history, family history, social history, and previous encounter notes.  I,  Water quality scientist, CMA, am acting as Location manager for [employer], DO.  I have reviewed the above documentation for accuracy and completeness, and I agree with the above. Marjory Sneddon, D.O.  The [place] was signed into law in 2016 which includes the topic of electronic health records.  This provides immediate access to information in MyChart.  This includes consultation notes, operative notes, office notes, lab results and pathology reports.  If you have any questions about what you read please let us know at your next visit so we can discuss your concerns and take corrective action if need be.  We are right here with you.

## 2020-05-29 ENCOUNTER — Other Ambulatory Visit: Payer: Self-pay

## 2020-05-29 ENCOUNTER — Ambulatory Visit (INDEPENDENT_AMBULATORY_CARE_PROVIDER_SITE_OTHER): Payer: 59 | Admitting: Family Medicine

## 2020-05-29 ENCOUNTER — Encounter (INDEPENDENT_AMBULATORY_CARE_PROVIDER_SITE_OTHER): Payer: Self-pay | Admitting: Family Medicine

## 2020-05-29 VITALS — BP 122/71 | HR 63 | Temp 97.6°F | Ht 64.0 in | Wt 171.0 lb

## 2020-05-29 DIAGNOSIS — Z9189 Other specified personal risk factors, not elsewhere classified: Secondary | ICD-10-CM | POA: Diagnosis not present

## 2020-05-29 DIAGNOSIS — Z6830 Body mass index (BMI) 30.0-30.9, adult: Secondary | ICD-10-CM

## 2020-05-29 DIAGNOSIS — E8881 Metabolic syndrome: Secondary | ICD-10-CM | POA: Diagnosis not present

## 2020-05-29 DIAGNOSIS — F39 Unspecified mood [affective] disorder: Secondary | ICD-10-CM | POA: Insufficient documentation

## 2020-05-29 DIAGNOSIS — E559 Vitamin D deficiency, unspecified: Secondary | ICD-10-CM | POA: Diagnosis not present

## 2020-05-29 DIAGNOSIS — E7849 Other hyperlipidemia: Secondary | ICD-10-CM | POA: Insufficient documentation

## 2020-05-29 DIAGNOSIS — E669 Obesity, unspecified: Secondary | ICD-10-CM

## 2020-05-29 MED ORDER — VITAMIN D (ERGOCALCIFEROL) 1.25 MG (50000 UNIT) PO CAPS: 50000.0000 [IU] | ORAL_CAPSULE | ORAL | 0 refills | Status: DC

## 2020-05-29 NOTE — Patient Instructions (Signed)
The 10-year ASCVD risk score Mikey Bussing DC Brooke Bonito., et al., 2013) is: 1.4%   Values used to calculate the score:     Age: 54 years     Sex: Female     Is Non-Hispanic African American: No     Diabetic: No     Tobacco smoker: No     Systolic Blood Pressure: 290 mmHg     Is BP treated: No     HDL Cholesterol: 67 mg/dL     Total Cholesterol: 270 mg/dL

## 2020-06-04 NOTE — Progress Notes (Signed)
Chief Complaint:   OBESITY Tara Yates is here to discuss her progress with her obesity treatment plan along with follow-up of her obesity related diagnoses. Tara Yates is on the Category 2 Plan and states she is following her eating plan approximately 90% of the time. Tara Yates states she is walking and doing gym exercise 30 minutes 4 times per week.  Today's visit was #: 2 Starting weight: 184 lbs Starting date: 05/15/2020 Today's weight: 171 lbs Today's date: 05/29/2020 Total lbs lost to date: 13 lbs Total lbs lost since last in-office visit: 13 lbs Total weight loss percentage to date: -7.07%  Interim History: Tara Yates has stopped drinking alcohol since her last office visit. She started walking and going to the gym. Tara Yates has accurate food recall, it's a lot of food. She is measuring meats and vegetables. Tara Yates feels good physically about meal plan. Denies hunger or cravings.  Plan: If next week she continues to lose weight, we will consider increasing healthy foods to Category 3.  Assessment/Plan:   1. Mood disorder ([place]), with emotional eating Discussed labs with patient today. Tara Yates is still seeing her therapist, Tara Yates, weekly. She hasn't contacted her PCP yet regarding going back on Lexapro. She tolerated it well with no side effects.  Plan: Tara Yates is using CALM app every night. She will contact her PCP regarding medication management, as she prefers to review options with provider who treated her for this in the past.  2. Other hyperlipidemia Discussed labs with patient today. Tara Yates has family history of acute myocardial infarctions. Her mom had AMI at age 65. Her maternal uncle had his first AMI at age 8 years old, and her maternal grandfather had his first AMI in his 54s.  Plan: Tara Yates declines statin medication despite very strong family history. She wises to wait 3-4 months and recheck, especially since in the past, with lower weight, her cholesterol was much better. I recommend that she discuss  coronary scoring with PCP for better risk stratification.   3. Insulin resistance New. Discussed labs with patient today. Tara Yates has a diagnosis of insulin resistance based on her elevated fasting insulin level >5. She continues to work on diet and exercise to decrease her risk of diabetes.  Lab Results  Component Value Date   INSULIN 9.3 05/15/2020   Lab Results  Component Value Date   HGBA1C 5.3 05/15/2020   Plan: Tara Yates will continue to work on weight loss, exercise, and decreasing simple carbohydrates to help decrease the risk of diabetes. Tara Yates agreed to follow-up with [place] as directed to closely monitor her progress.  4. Vitamin D deficiency New. Discussed labs with patient today. Tara Yates Vitamin D level was 34 on 05/15/2020. She is currently taking no vitamin D supplement. She denies nausea, vomiting or muscle weakness.  Plan: Start prescription Vit D and recheck level in 3 months.  5. At risk for heart disease Due to Tara Yates's current state of health and medical condition(s), she is at a higher risk for heart disease.   This puts the patient at much greater risk to subsequently develop cardiopulmonary conditions that can significantly affect patient's quality of life in a negative manner as well.    At least 30 minutes was spent on counseling Tara Yates about these concerns today and I stressed the importance of reversing risks factors of obesity, esp truncal and visceral fat, hypertension, hyperlipidemia, pre-diabetes.   Initial goal is to lose at least 5-10% of starting weight to help reduce these risk factors.  Counseling: Intensive lifestyle modifications discussed with Tara Yates as most appropriate first line treatment.  she will continue to work on diet, exercise and weight loss efforts.  We will continue to reassess these conditions on a fairly regular basis in an attempt to decrease patient's overall morbidity and mortality.  Evidence-based interventions for health behavior change were utilized  today including the discussion of self monitoring techniques, problem-solving barriers and SMART goal setting techniques.  Specifically regarding patient's less desirable eating habits and patterns, we employed the technique of small changes when Tara Yates has not been able to fully commit to her prudent nutritional plan.  6. Class 1 obesity with serious comorbidity and body mass index (BMI) of 30.0 to 30.9 in adult, unspecified obesity type Tara Yates is currently in the action stage of change. As such, her goal is to continue with weight loss efforts. She has agreed to the Category 2 Plan.   Exercise goals: As is  Behavioral modification strategies: increasing lean protein intake, decreasing simple carbohydrates, meal planning and cooking strategies, holiday eating strategies , celebration eating strategies and planning for success.  Tara Yates has agreed to follow-up with our clinic in 2 weeks. She was informed of the importance of frequent follow-up visits to maximize her success with intensive lifestyle modifications for her multiple health conditions.   Objective:   Blood pressure 122/71, pulse 63, temperature 97.6 F (36.4 C), height 5\' 4"  (1.626 m), weight 171 lb (77.6 kg), SpO2 96 %. Body mass index is 29.35 kg/m.  General: Cooperative, alert, well developed, in no acute distress. HEENT: Conjunctivae and lids unremarkable. Cardiovascular: Regular rhythm.  Lungs: Normal work of breathing. Neurologic: No focal deficits.   Lab Results  Component Value Date   CREATININE 0.73 05/15/2020   BUN 13 05/15/2020   NA 140 05/15/2020   K 4.5 05/15/2020   CL 100 05/15/2020   CO2 22 05/15/2020   Lab Results  Component Value Date   ALT 27 05/15/2020   AST 16 05/15/2020   ALKPHOS 93 05/15/2020   BILITOT 0.4 05/15/2020   Lab Results  Component Value Date   HGBA1C 5.3 05/15/2020   HGBA1C 4.9 06/16/2019   Lab Results  Component Value Date   INSULIN 9.3 05/15/2020   Lab Results  Component Value  Date   TSH 2.280 05/15/2020   Lab Results  Component Value Date   CHOL 270 (H) 05/15/2020   HDL 67 05/15/2020   LDLCALC 186 (H) 05/15/2020   TRIG 99 05/15/2020   CHOLHDL 4.0 05/15/2020   Lab Results  Component Value Date   WBC 4.5 05/15/2020   HGB 14.1 05/15/2020   HCT 41.3 05/15/2020   MCV 91 05/15/2020   PLT 234 05/15/2020    Attestation Statements:   Reviewed by clinician on day of visit: allergies, medications, problem list, medical history, surgical history, family history, social history, and previous encounter notes.   Coral Ceo, am acting as Location manager for [employer], DO.  I have reviewed the above documentation for accuracy and completeness, and I agree with the above. Marjory Sneddon, D.O.  The [place] was signed into law in 2016 which includes the topic of electronic health records.  This provides immediate access to information in MyChart.  This includes consultation notes, operative notes, office notes, lab results and pathology reports.  If you have any questions about what you read please let us know at your next visit so we can discuss your concerns and take corrective  action if need be.  We are right here with you.

## 2020-06-11 ENCOUNTER — Other Ambulatory Visit: Payer: Self-pay | Admitting: Podiatry

## 2020-06-11 NOTE — Telephone Encounter (Signed)
Ok to refill one more time

## 2020-06-11 NOTE — Telephone Encounter (Signed)
Please advise 

## 2020-06-13 ENCOUNTER — Other Ambulatory Visit: Payer: Self-pay

## 2020-06-13 ENCOUNTER — Encounter (INDEPENDENT_AMBULATORY_CARE_PROVIDER_SITE_OTHER): Payer: Self-pay | Admitting: Family Medicine

## 2020-06-13 ENCOUNTER — Ambulatory Visit (INDEPENDENT_AMBULATORY_CARE_PROVIDER_SITE_OTHER): Payer: 59 | Admitting: Family Medicine

## 2020-06-13 VITALS — BP 94/60 | HR 62 | Temp 98.3°F | Ht 64.0 in | Wt 169.0 lb

## 2020-06-13 DIAGNOSIS — E669 Obesity, unspecified: Secondary | ICD-10-CM | POA: Diagnosis not present

## 2020-06-13 DIAGNOSIS — E559 Vitamin D deficiency, unspecified: Secondary | ICD-10-CM | POA: Diagnosis not present

## 2020-06-13 DIAGNOSIS — Z6830 Body mass index (BMI) 30.0-30.9, adult: Secondary | ICD-10-CM | POA: Diagnosis not present

## 2020-06-13 DIAGNOSIS — Z9189 Other specified personal risk factors, not elsewhere classified: Secondary | ICD-10-CM | POA: Diagnosis not present

## 2020-06-13 MED ORDER — VITAMIN D (ERGOCALCIFEROL) 1.25 MG (50000 UNIT) PO CAPS: 50000.0000 [IU] | ORAL_CAPSULE | ORAL | 0 refills | Status: DC

## 2020-06-13 NOTE — Progress Notes (Signed)
Chief Complaint:   OBESITY Tara Yates is here to discuss her progress with her obesity treatment plan along with follow-up of her obesity related diagnoses. Tara Yates is on the Category 2 Plan and states she is following her eating plan approximately 80% of the time. Tara Yates states she is working with a Physiological scientist and walking 60 minutes 3 times per week.  Today's visit was #: 3 Starting weight: 184 lbs Starting date: 05/15/2020 Today's weight: 169 lbs Today's date: 06/19/2020 Total lbs lost to date: 15 lbs Total lbs lost since last in-office visit: 2 lbs Total weight loss percentage to date: -8.15%  Interim History: Last week Tara Yates started 3 days a week of personal training. She is also following the plan and denies hunger and cravings at all.  Assessment/Plan:   Meds ordered this encounter  Medications   Vitamin D, Ergocalciferol, (DRISDOL) 1.25 MG (50000 UNIT) CAPS capsule    Sig: Take 1 capsule (50,000 Units total) by mouth every 7 (seven) days.    Dispense:  4 capsule    Refill:  0    1. Vitamin D deficiency Mrytle's Vitamin D level was 34 on 05/15/2020. She is currently taking prescription vitamin D 50,000 IU each week. She denies nausea, vomiting or muscle weakness.  Plan: Refill Vit D for 1 month, as per below. Low Vitamin D level contributes to fatigue and are associated with obesity, breast, and colon cancer. She agrees to continue to take prescription Vitamin D @50 ,000 IU every week and will follow-up for routine testing of Vitamin D, at least 2-3 times per year to avoid over-replacement.  Refill- Vitamin D, Ergocalciferol, (DRISDOL) 1.25 MG (50000 UNIT) CAPS capsule; Take 1 capsule (50,000 Units total) by mouth every 7 (seven) days.  Dispense: 4 capsule; Refill: 0  2. At risk for dehydration Tara Yates is at higher than average risk of dehydration. Lurine was given more than 15 minutes of proper hydration counseling today. We discussed the signs and symptoms of dehydration some of  which may include muscle cramping, constipation or even orthostatic symptoms.   Counseling on the prevention of dehydration was also provided today. Tara Yates is at risk for dehydration due to weight loss, lifestyle and behavorial habits and possibly due to taking certain medication(s). She was encouraged to adequately hydrate and monitor fluid status to avoid dehydration as well as weight loss plateaus. Unless pre-existing renal or cardiopulmonary conditions exist which pt was told to limit their fluid intake, I recommended roughly one half of their weight in pounds to be the approximate ounces of non-caloric, non-caffeinated beverages they should drink per day; including more if they are engaging in exercise.  3. Class 1 obesity with serious comorbidity and body mass index (BMI) of 30.0 to 30.9 in adult, unspecified obesity type Tara Yates is currently in the action stage of change. As such, her goal is to continue with weight loss efforts. She has agreed to the Category 2 Plan.   Exercise goals: As is  Behavioral modification strategies: increasing lean protein intake and planning for success.  Tara Yates has agreed to follow-up with our clinic in 2-4 weeks. She was informed of the importance of frequent follow-up visits to maximize her success with intensive lifestyle modifications for her multiple health conditions.   Objective:   Blood pressure 94/60, pulse 62, temperature 98.3 F (36.8 C), height 5\' 4"  (1.626 m), weight 169 lb (76.7 kg), SpO2 98 %. Body mass index is 29.01 kg/m.  General: Cooperative, alert, well developed, in no  acute distress. HEENT: Conjunctivae and lids unremarkable. Cardiovascular: Regular rhythm.  Lungs: Normal work of breathing. Neurologic: No focal deficits.   Lab Results  Component Value Date   CREATININE 0.73 05/15/2020   BUN 13 05/15/2020   NA 140 05/15/2020   K 4.5 05/15/2020   CL 100 05/15/2020   CO2 22 05/15/2020   Lab Results  Component Value Date   ALT 27  05/15/2020   AST 16 05/15/2020   ALKPHOS 93 05/15/2020   BILITOT 0.4 05/15/2020   Lab Results  Component Value Date   HGBA1C 5.3 05/15/2020   HGBA1C 4.9 06/16/2019   Lab Results  Component Value Date   INSULIN 9.3 05/15/2020   Lab Results  Component Value Date   TSH 2.280 05/15/2020   Lab Results  Component Value Date   CHOL 270 (H) 05/15/2020   HDL 67 05/15/2020   LDLCALC 186 (H) 05/15/2020   TRIG 99 05/15/2020   CHOLHDL 4.0 05/15/2020   Lab Results  Component Value Date   WBC 4.5 05/15/2020   HGB 14.1 05/15/2020   HCT 41.3 05/15/2020   MCV 91 05/15/2020   PLT 234 05/15/2020    Attestation Statements:   Reviewed by clinician on day of visit: allergies, medications, problem list, medical history, surgical history, family history, social history, and previous encounter notes.  Coral Ceo, am acting as Location manager for [employer], DO.  I have reviewed the above documentation for accuracy and completeness, and I agree with the above. Marjory Sneddon, D.O.  The [place] was signed into law in 2016 which includes the topic of electronic health records.  This provides immediate access to information in MyChart.  This includes consultation notes, operative notes, office notes, lab results and pathology reports.  If you have any questions about what you read please let us know at your next visit so we can discuss your concerns and take corrective action if need be.  We are right here with you.

## 2020-06-16 ENCOUNTER — Other Ambulatory Visit: Payer: Self-pay | Admitting: Family Medicine

## 2020-06-16 DIAGNOSIS — E039 Hypothyroidism, unspecified: Secondary | ICD-10-CM

## 2020-06-26 ENCOUNTER — Other Ambulatory Visit (INDEPENDENT_AMBULATORY_CARE_PROVIDER_SITE_OTHER): Payer: Self-pay | Admitting: Family Medicine

## 2020-06-26 DIAGNOSIS — E559 Vitamin D deficiency, unspecified: Secondary | ICD-10-CM

## 2020-07-07 HISTORY — PX: CARPAL TUNNEL RELEASE: SHX101

## 2020-07-09 ENCOUNTER — Telehealth (INDEPENDENT_AMBULATORY_CARE_PROVIDER_SITE_OTHER): Payer: 59 | Admitting: Family Medicine

## 2020-08-02 ENCOUNTER — Other Ambulatory Visit (INDEPENDENT_AMBULATORY_CARE_PROVIDER_SITE_OTHER): Payer: Self-pay | Admitting: Family Medicine

## 2020-08-02 DIAGNOSIS — E559 Vitamin D deficiency, unspecified: Secondary | ICD-10-CM

## 2020-08-06 ENCOUNTER — Other Ambulatory Visit: Payer: Self-pay | Admitting: Family Medicine

## 2020-08-06 DIAGNOSIS — Z1231 Encounter for screening mammogram for malignant neoplasm of breast: Secondary | ICD-10-CM

## 2020-08-17 ENCOUNTER — Ambulatory Visit (INDEPENDENT_AMBULATORY_CARE_PROVIDER_SITE_OTHER): Payer: 59 | Admitting: Family Medicine

## 2020-08-17 ENCOUNTER — Other Ambulatory Visit: Payer: Self-pay

## 2020-08-17 ENCOUNTER — Encounter: Payer: Self-pay | Admitting: Family Medicine

## 2020-08-17 VITALS — BP 102/78 | HR 75 | Temp 98.1°F | Ht 64.0 in | Wt 174.8 lb

## 2020-08-17 DIAGNOSIS — E039 Hypothyroidism, unspecified: Secondary | ICD-10-CM

## 2020-08-17 DIAGNOSIS — E782 Mixed hyperlipidemia: Secondary | ICD-10-CM

## 2020-08-17 DIAGNOSIS — Z23 Encounter for immunization: Secondary | ICD-10-CM

## 2020-08-17 DIAGNOSIS — F4321 Adjustment disorder with depressed mood: Secondary | ICD-10-CM | POA: Diagnosis not present

## 2020-08-17 DIAGNOSIS — R635 Abnormal weight gain: Secondary | ICD-10-CM

## 2020-08-17 DIAGNOSIS — Z0000 Encounter for general adult medical examination without abnormal findings: Secondary | ICD-10-CM | POA: Diagnosis not present

## 2020-08-17 LAB — LIPID PANEL
Cholesterol: 210 mg/dL — ABNORMAL HIGH (ref 0–200)
HDL: 57 mg/dL (ref >39.00)
LDL Cholesterol: 127 mg/dL — ABNORMAL HIGH (ref 0–99)
NonHDL: 153.41
Total CHOL/HDL Ratio: 4
Triglycerides: 133 mg/dL (ref 0.0–149.0)
VLDL: 26.6 mg/dL (ref 0.0–40.0)

## 2020-08-17 LAB — CBC WITH DIFFERENTIAL/PLATELET
Basophils Absolute: 0 10*3/uL (ref 0.0–0.1)
Basophils Relative: 1 % (ref 0.0–3.0)
Eosinophils Absolute: 0.1 10*3/uL (ref 0.0–0.7)
Eosinophils Relative: 2.7 % (ref 0.0–5.0)
HCT: 39.4 % (ref 36.0–46.0)
Hemoglobin: 13.4 g/dL (ref 12.0–15.0)
Lymphocytes Relative: 40.5 % (ref 12.0–46.0)
Lymphs Abs: 1.7 10*3/uL (ref 0.7–4.0)
MCHC: 33.9 g/dL (ref 30.0–36.0)
MCV: 90.3 fl (ref 78.0–100.0)
Monocytes Absolute: 0.4 10*3/uL (ref 0.1–1.0)
Monocytes Relative: 10.2 % (ref 3.0–12.0)
Neutro Abs: 2 10*3/uL (ref 1.4–7.7)
Neutrophils Relative %: 45.6 % (ref 43.0–77.0)
Platelets: 223 10*3/uL (ref 150.0–400.0)
RBC: 4.36 Mil/uL (ref 3.87–5.11)
RDW: 13.1 % (ref 11.5–15.5)
WBC: 4.3 10*3/uL (ref 4.0–10.5)

## 2020-08-17 LAB — BASIC METABOLIC PANEL
BUN: 11 mg/dL (ref 6–23)
CO2: 29 mEq/L (ref 19–32)
Calcium: 9.6 mg/dL (ref 8.4–10.5)
Chloride: 102 mEq/L (ref 96–112)
Creatinine, Ser: 0.7 mg/dL (ref 0.40–1.20)
GFR: 99.6 mL/min (ref >60.00)
Glucose, Bld: 86 mg/dL (ref 70–99)
Potassium: 4.1 mEq/L (ref 3.5–5.1)
Sodium: 142 mEq/L (ref 135–145)

## 2020-08-17 LAB — VITAMIN D 25 HYDROXY (VIT D DEFICIENCY, FRACTURES): VITD: 34.78 ng/mL (ref 30.00–100.00)

## 2020-08-17 LAB — VITAMIN B12: Vitamin B-12: 589 pg/mL (ref 211–911)

## 2020-08-17 LAB — TSH: TSH: 3.48 u[IU]/mL (ref 0.35–4.50)

## 2020-08-17 MED ORDER — SERTRALINE HCL 25 MG PO TABS: 25.0000 mg | ORAL_TABLET | Freq: Every day | ORAL | 3 refills | Status: DC

## 2020-08-17 NOTE — Progress Notes (Signed)
Subjective:     Tara Yates is a 55 y.o. female and is here for a comprehensive physical exam. Pt states doing ok, dealing with a break up.  Notes trying to keep busy by spending time with her kids and friends.  Endorses increased crying and depression symptoms.  Interested in restarting meds, was on lexapro which made her feel numb.  Working with a Clinical research associate, weight staying the same, but clothes fitting better.  In the past went to [facility] clinic and weight management clinic.  Pt trying to eat healthy, increasing water intake as does not want to be on meds for cholesterol.  Notes family h/o HLD and CHF in mom.  Taking metamucil instead of Miralax as heard it can help lower cholesterol.  Pt inquires about 2nd dose of shingles vaccine.  Had 1 dose about 1 yr ago.  Mammogram and pap scheduled.  Pt had covid pfizer vaccines on 08/12/19 and 09/02/19.  Social History   Socioeconomic History  . Marital status: Divorced    Spouse name: Not on file  . Number of children: Not on file  . Years of education: Not on file  . Highest education level: Not on file  Occupational History  . Not on file  Tobacco Use  . Smoking status: Former Smoker    Packs/day: 1.00    Years: 29.00    Pack years: 29.00    Types: Cigarettes    Quit date: 2000    Years since quitting: 22.1  . Smokeless tobacco: Never Used  . Tobacco comment: Quit 20 years ago  Vaping Use  . Vaping Use: Never used  Substance and Sexual Activity  . Alcohol use: Yes    Comment: Wine daily  . Drug use: Never  . Sexual activity: Not on file  Other Topics Concern  . Not on file  Social History Narrative  . Not on file   Social Determinants of Health   Financial Resource Strain: Not on file  Food Insecurity: Not on file  Transportation Needs: Not on file  Physical Activity: Not on file  Stress: Not on file  Social Connections: Not on file  Intimate Partner Violence: Not on file   Health Maintenance  Topic Date Due  .  Hepatitis C Screening  Never done  . COVID-19 Vaccine (1) Never done  . HIV Screening  Never done  . PAP SMEAR-Modifier  Never done  . INFLUENZA VACCINE  02/05/2020  . TETANUS/TDAP  03/04/2020  . MAMMOGRAM  06/12/2021  . COLONOSCOPY (Pts 45-45yrs Insurance coverage will need to be confirmed)  07/30/2021    The following portions of the patient's history were reviewed and updated as appropriate: allergies, current medications, past family history, past medical history, past social history, past surgical history and problem list.  Review of Systems Pertinent items noted in HPI and remainder of comprehensive ROS otherwise negative.   Objective:    BP 102/78 (BP Location: Left Arm, Patient Position: Sitting, Cuff Size: Normal)   Pulse 75   Temp 98.1 F (36.7 C) (Oral)   Ht 5\' 4"  (1.626 m)   Wt 174 lb 12.8 oz (79.3 kg)   SpO2 97%   BMI 30.00 kg/m  General appearance: alert, cooperative and no distress Head: Normocephalic, without obvious abnormality, atraumatic Eyes: conjunctivae/corneas clear. PERRL, EOM's intact. Fundi benign. Ears: normal TM's and external ear canals both ears Nose: Nares normal. Septum midline. Mucosa normal. No drainage or sinus tenderness. Throat: lips, mucosa, and tongue normal;  teeth and gums normal Neck: no adenopathy, no carotid bruit, no JVD, supple, symmetrical, trachea midline and thyroid not enlarged, symmetric, no tenderness/mass/nodules Lungs: clear to auscultation bilaterally Heart: regular rate and rhythm, S1, S2 normal, no murmur, click, rub or gallop Abdomen: soft, non-tender; bowel sounds normal; no masses,  no organomegaly Extremities: extremities normal, atraumatic, no cyanosis or edema Pulses: 2+ and symmetric Skin: Skin color, texture, turgor normal. No rashes or lesions Lymph nodes: Cervical, supraclavicular, and axillary nodes normal. Neurologic: Alert and oriented X 3, normal strength and tone. Normal symmetric reflexes. Normal  coordination and gait    Assessment:    Healthy female exam with depression symptoms 2/2 adjustment d/o. Plan:     Anticipatory guidance given including wearing seatbelts, smoke detectors in the home, increasing physical activity, increasing p.o. intake of water and vegetables. -will obtain labs -colonoscopy due 07/30/2021 -Mammogram scheduled for 09/19/2020 -Pap scheduled with OB/GYN. -Given handout -Next CPE in 1 year See After Visit Summary for Counseling Recommendations    Mixed hyperlipidemia -Continue lifestyle modification -Consider fish oil tablets - Plan: Lipid panel  Adjustment disorder with depressed mood -PHQ-9 score 11 -GAD-7 score 7 -Continue counseling -Previously on Lexapro, however discussed trying zoloft as may have better results -We will start Zoloft 25 mg daily -Patient to follow-up in 4-6 weeks, sooner if needed -Given precautions - Plan: sertraline (ZOLOFT) 25 MG tablet, TSH, Vitamin D, 25-hydroxy, Vitamin B12  Acquired hypothyroidism  -Continue Synthroid 25 mcg -We will obtain TSH and make adjustments as needed. - Plan: TSH  Need for Shingles vaccination -given this visit  Weight gain  -Continue lifestyle modifications. - Plan: TSH, Hemoglobin A1c, Vitamin D, 25-hydroxy, Vitamin B12  F/u in 1 month, sooner if needed  Grier Mitts, MD

## 2020-08-17 NOTE — Patient Instructions (Addendum)
Preventive Care 84-53 Years Old, Female Preventive care refers to lifestyle choices and visits with your health care provider that can promote health and wellness. This includes:  A yearly physical exam. This is also called an annual wellness visit.  Regular dental and eye exams.  Immunizations.  Screening for certain conditions.  Healthy lifestyle choices, such as: ? Eating a healthy diet. ? Getting regular exercise. ? Not using drugs or products that contain nicotine and tobacco. ? Limiting alcohol use. What can I expect for my preventive care visit? Physical exam Your health care provider will check your:  Height and weight. These may be used to calculate your BMI (body mass index). BMI is a measurement that tells if you are at a healthy weight.  Heart rate and blood pressure.  Body temperature.  Skin for abnormal spots. Counseling Your health care provider may ask you questions about your:  Past medical problems.  Family's medical history.  Alcohol, tobacco, and drug use.  Emotional well-being.  Home life and relationship well-being.  Sexual activity.  Diet, exercise, and sleep habits.  Work and work Statistician.  Access to firearms.  Method of birth control.  Menstrual cycle.  Pregnancy history. What immunizations do I need? Vaccines are usually given at various ages, according to a schedule. Your health care provider will recommend vaccines for you based on your age, medical history, and lifestyle or other factors, such as travel or where you work.   What tests do I need? Blood tests  Lipid and cholesterol levels. These may be checked every 5 years, or more often if you are over 3 years old.  Hepatitis C test.  Hepatitis B test. Screening  Lung cancer screening. You may have this screening every year starting at age 73 if you have a 30-pack-year history of smoking and currently smoke or have quit within the past 15 years.  Colorectal cancer  screening. ? All adults should have this screening starting at age 52 and continuing until age 17. ? Your health care provider may recommend screening at age 49 if you are at increased risk. ? You will have tests every 1-10 years, depending on your results and the type of screening test.  Diabetes screening. ? This is done by checking your blood sugar (glucose) after you have not eaten for a while (fasting). ? You may have this done every 1-3 years.  Mammogram. ? This may be done every 1-2 years. ? Talk with your health care provider about when you should start having regular mammograms. This may depend on whether you have a family history of breast cancer.  BRCA-related cancer screening. This may be done if you have a family history of breast, ovarian, tubal, or peritoneal cancers.  Pelvic exam and Pap test. ? This may be done every 3 years starting at age 10. ? Starting at age 11, this may be done every 5 years if you have a Pap test in combination with an HPV test. Other tests  STD (sexually transmitted disease) testing, if you are at risk.  Bone density scan. This is done to screen for osteoporosis. You may have this scan if you are at high risk for osteoporosis. Talk with your health care provider about your test results, treatment options, and if necessary, the need for more tests. Follow these instructions at home: Eating and drinking  Eat a diet that includes fresh fruits and vegetables, whole grains, lean protein, and low-fat dairy products.  Take vitamin and mineral supplements  as recommended by your health care provider.  Do not drink alcohol if: ? Your health care provider tells you not to drink. ? You are pregnant, may be pregnant, or are planning to become pregnant.  If you drink alcohol: ? Limit how much you have to 0-1 drink a day. ? Be aware of how much alcohol is in your drink. In the [place] one drink equals one 12 oz bottle of beer (355 mL), one 5 oz glass of  wine (148 mL), or one 1 oz glass of hard liquor (44 mL).   Lifestyle  Take daily care of your teeth and gums. Brush your teeth every morning and night with fluoride toothpaste. Floss one time each day.  Stay active. Exercise for at least 30 minutes 5 or more days each week.  Do not use any products that contain nicotine or tobacco, such as cigarettes, e-cigarettes, and chewing tobacco. If you need help quitting, ask your health care provider.  Do not use drugs.  If you are sexually active, practice safe sex. Use a condom or other form of protection to prevent STIs (sexually transmitted infections).  If you do not wish to become pregnant, use a form of birth control. If you plan to become pregnant, see your health care provider for a prepregnancy visit.  If told by your health care provider, take low-dose aspirin daily starting at age 61.  Find healthy ways to cope with stress, such as: ? Meditation, yoga, or listening to music. ? Journaling. ? Talking to a trusted person. ? Spending time with friends and family. Safety  Always wear your seat belt while driving or riding in a vehicle.  Do not drive: ? If you have been drinking alcohol. Do not ride with someone who has been drinking. ? When you are tired or distracted. ? While texting.  Wear a helmet and other protective equipment during sports activities.  If you have firearms in your house, make sure you follow all gun safety procedures. What's next?  Visit your health care provider once a year for an annual wellness visit.  Ask your health care provider how often you should have your eyes and teeth checked.  Stay up to date on all vaccines. This information is not intended to replace advice given to you by your health care provider. Make sure you discuss any questions you have with your health care provider. Document Revised: 03/27/2020 Document Reviewed: 03/04/2018 Elsevier Patient Education  2021 [employer].  Dyslipidemia Dyslipidemia is an imbalance of waxy, fat-like substances (lipids) in the blood. The body needs lipids in small amounts. Dyslipidemia often involves a high level of cholesterol or triglycerides, which are types of lipids. Common forms of dyslipidemia include:  High levels of LDL cholesterol. LDL is the type of cholesterol that causes fatty deposits (plaques) to build up in the blood vessels that carry blood away from your heart (arteries).  Low levels of HDL cholesterol. HDL cholesterol is the type of cholesterol that protects against heart disease. High levels of HDL remove the LDL buildup from arteries.  High levels of triglycerides. Triglycerides are a fatty substance in the blood that is linked to a buildup of plaques in the arteries. What are the causes? Primary dyslipidemia is caused by changes (mutations) in genes that are passed down through families (inherited). These mutations cause several types of dyslipidemia. Secondary dyslipidemia is caused by lifestyle choices and diseases that lead to dyslipidemia, such as:  Eating a diet that is high  in animal fat.  Not getting enough exercise.  Having diabetes, kidney disease, liver disease, or thyroid disease.  Drinking large amounts of alcohol.  Using certain medicines. What increases the risk? You are more likely to develop this condition if you are an older man or if you are a woman who has gone through menopause. Other risk factors include:  Having a family history of dyslipidemia.  Taking certain medicines, including birth control pills, steroids, some diuretics, and beta-blockers.  Smoking cigarettes.  Eating a high-fat diet.  Having certain medical conditions such as diabetes, polycystic ovary syndrome (PCOS), kidney disease, liver disease, or hypothyroidism.  Not exercising regularly.  Being overweight or obese with too much belly fat. What are the signs or symptoms? In most cases, dyslipidemia  does not usually cause any symptoms. In severe cases, very high lipid levels can cause:  Fatty bumps under the skin (xanthomas).  White or gray ring around the black center (pupil) of the eye. Very high triglyceride levels can cause inflammation of the pancreas (pancreatitis). How is this diagnosed? Your health care provider may diagnose dyslipidemia based on a routine blood test (fasting blood test). Because most people do not have symptoms of the condition, this blood testing (lipid profile) is done on adults age 34 and older and is repeated every 5 years. This test checks:  Total cholesterol. This measures the total amount of cholesterol in your blood, including LDL cholesterol, HDL cholesterol, and triglycerides. A healthy number is below 200.  LDL cholesterol. The target number for LDL cholesterol is different for each person, depending on individual risk factors. Ask your health care provider what your LDL cholesterol should be.  HDL cholesterol. An HDL level of 60 or higher is best because it helps to protect against heart disease. A number below 40 for men or below 50 for women increases the risk for heart disease.  Triglycerides. A healthy triglyceride number is below 150. If your lipid profile is abnormal, your health care provider may do other blood tests.   How is this treated? Treatment depends on the type of dyslipidemia that you have and your other risk factors for heart disease and stroke. Your health care provider will have a target range for your lipid levels based on this information. For many people, this condition may be treated by lifestyle changes, such as diet and exercise. Your health care provider may recommend that you:  Get regular exercise.  Make changes to your diet.  Quit smoking if you smoke. If diet changes and exercise do not help you reach your goals, your health care provider may also prescribe medicine to lower lipids. The most commonly prescribed type  of medicine lowers your LDL cholesterol (statin drug). If you have a high triglyceride level, your provider may prescribe another type of drug (fibrate) or an omega-3 fish oil supplement, or both. Follow these instructions at home: Eating and drinking  Follow instructions from your health care provider or dietitian about eating or drinking restrictions.  Eat a healthy diet as told by your health care provider. This can help you reach and maintain a healthy weight, lower your LDL cholesterol, and raise your HDL cholesterol. This may include: ? Limiting your calories, if you are overweight. ? Eating more fruits, vegetables, whole grains, fish, and lean meats. ? Limiting saturated fat, trans fat, and cholesterol.  If you drink alcohol: ? Limit how much you use. ? Be aware of how much alcohol is in your drink. In the [place]  one drink equals one 12 oz bottle of beer (355 mL), one 5 oz glass of wine (148 mL), or one 1 oz glass of hard liquor (44 mL).  Do not drink alcohol if: ? Your health care provider tells you not to drink. ? You are pregnant, may be pregnant, or are planning to become pregnant. Activity  Get regular exercise. Start an exercise and strength training program as told by your health care provider. Ask your health care provider what activities are safe for you. Your health care provider may recommend: ? 30 minutes of aerobic activity 4-6 days a week. Brisk walking is an example of aerobic activity. ? Strength training 2 days a week. General instructions  Do not use any products that contain nicotine or tobacco, such as cigarettes, e-cigarettes, and chewing tobacco. If you need help quitting, ask your health care provider.  Take over-the-counter and prescription medicines only as told by your health care provider. This includes supplements.  Keep all follow-up visits as told by your health care provider.   Contact a health care provider if:  You are: ? Having trouble  sticking to your exercise or diet plan. ? Struggling to quit smoking or control your use of alcohol. Summary  Dyslipidemia often involves a high level of cholesterol or triglycerides, which are types of lipids.  Treatment depends on the type of dyslipidemia that you have and your other risk factors for heart disease and stroke.  For many people, treatment starts with lifestyle changes, such as diet and exercise.  Your health care provider may prescribe medicine to lower lipids. This information is not intended to replace advice given to you by your health care provider. Make sure you discuss any questions you have with your health care provider. Document Revised: 02/15/2018 Document Reviewed: 01/22/2018 Elsevier Patient Education  [employer].

## 2020-08-20 LAB — HEMOGLOBIN A1C: Hgb A1c MFr Bld: 5.3 % (ref 4.6–6.5)

## 2020-09-05 ENCOUNTER — Telehealth (INDEPENDENT_AMBULATORY_CARE_PROVIDER_SITE_OTHER): Payer: 59 | Admitting: Family Medicine

## 2020-09-05 ENCOUNTER — Encounter: Payer: Self-pay | Admitting: Family Medicine

## 2020-09-05 DIAGNOSIS — F3289 Other specified depressive episodes: Secondary | ICD-10-CM

## 2020-09-05 DIAGNOSIS — J069 Acute upper respiratory infection, unspecified: Secondary | ICD-10-CM

## 2020-09-05 DIAGNOSIS — F411 Generalized anxiety disorder: Secondary | ICD-10-CM | POA: Diagnosis not present

## 2020-09-05 MED ORDER — FLUTICASONE PROPIONATE 50 MCG/ACT NA SUSP: 1.0000 | Freq: Every day | NASAL | 2 refills | Status: DC

## 2020-09-05 MED ORDER — BENZONATATE 100 MG PO CAPS: 100.0000 mg | ORAL_CAPSULE | Freq: Two times a day (BID) | ORAL | 0 refills | Status: DC | PRN

## 2020-09-05 NOTE — Progress Notes (Signed)
Virtual Visit via Video Note  I connected with Tara Yates on 09/05/20 at  1:00 PM EST by a video enabled telemedicine application 2/2 [id number] pandemic and verified that I am speaking with the correct person using two identifiers.  Location patient: home Location provider:work or home office Persons participating in the virtual visit: patient, provider  I discussed the limitations of evaluation and management by telemedicine and the availability of in person appointments. The patient expressed understanding and agreed to proceed.   HPI: Pt concerned about having a sinus infection.  Endorses cough, congestion, tooth pain, ear pain/pressure, drainage, frontal HA x 1 day.  Pt denies sick contacts.  Took 2 negative home COVID test.  Taking Mucinex D and OTC cold and flu med.  Patient has a big presentation tomorrow evening for work.  Pt notes improvement in anxiety and depression symptoms since starting zoloft on 08/17/20.  Pt states she is not crying daily.  Has noticed vivid dreams, but they do not bother her.  Prior to getting sick, she states her energy, mood, appetite, and sleep were good.    ROS: See pertinent positives and negatives per HPI.  Past Medical History:  Diagnosis Date  . Allergy   . Anxiety   . Back pain   . Constipation   . Depression   . Hyperlipidemia   . Hypothyroidism   . Joint pain   . Plantar fasciitis, bilateral   . SOBOE (shortness of breath on exertion)     Past Surgical History:  Procedure Laterality Date  . BREAST BIOPSY Right 02/28/2015  . BREAST EXCISIONAL BIOPSY Right 03/29/2015   had seed placement as wel  . ENDOMETRIAL ABLATION    . MOUTH SURGERY    . RADIOACTIVE SEED GUIDED EXCISIONAL BREAST BIOPSY Right 04/03/2015   Procedure: RADIOACTIVE SEED GUIDED EXCISIONAL BREAST BIOPSY;  Surgeon: Rolm Bookbinder, MD;  Location: [facility];  Service: General;  Laterality: Right;    Family History  Problem Relation Age of Onset  .  Heart disease Mother   . Heart failure Mother   . Heart attack Mother   . Hyperlipidemia Mother   . Sudden death Mother   . Heart disease Father   . Hyperlipidemia Father   . Hypertension Father   . Thyroid disease Father   . Sleep apnea Father   . Obesity Father   . Breast cancer Neg Hx   . Colon cancer Neg Hx   . Esophageal cancer Neg Hx   . Rectal cancer Neg Hx   . Stomach cancer Neg Hx      Current Outpatient Medications:  .  levothyroxine (SYNTHROID) 25 MCG tablet, TAKE 1 TABLET(25 MCG) BY MOUTH DAILY BEFORE BREAKFAST, Disp: 90 tablet, Rfl: 1 .  polyethylene glycol (MIRALAX / GLYCOLAX) 17 g packet, Take 17 g by mouth daily., Disp: , Rfl:  .  sertraline (ZOLOFT) 25 MG tablet, Take 1 tablet (25 mg total) by mouth daily., Disp: 30 tablet, Rfl: 3  EXAM:  VITALS per patient if applicable: RR between 24-82 bpm  GENERAL: alert, oriented, appears well and in no acute distress  HEENT: atraumatic, conjunctiva clear, no obvious abnormalities on inspection of external nose and ears  NECK: normal movements of the head and neck  LUNGS: on inspection no signs of respiratory distress, breathing rate appears normal, no obvious gross SOB, gasping or wheezing  CV: no obvious cyanosis  MS: moves all visible extremities without noticeable abnormality  PSYCH/NEURO: pleasant and cooperative, no obvious depression  or anxiety, speech and thought processing grossly intact  ASSESSMENT AND PLAN:  Discussed the following assessment and plan:  Viral URI with cough -Covid testing negative x2 at home -Given symptom duration of 1 day continue supportive care -We will start Flonase and Tessalon -Continue to monitor - Plan: fluticasone (FLONASE) 50 MCG/ACT nasal spray, benzonatate (TESSALON) 100 MG capsule  GAD (generalized anxiety disorder) -GAD-7 score was 7 on 08/17/2020 -Continue Zoloft 25 mg daily  Other depression -PHQ-9 score was 11 on 08/17/2020 -Continue Zoloft 25 mg  Follow-up  as needed for continued or worsening symptoms   I discussed the assessment and treatment plan with the patient. The patient was provided an opportunity to ask questions and all were answered. The patient agreed with the plan and demonstrated an understanding of the instructions.   The patient was advised to call back or seek an in-person evaluation if the symptoms worsen or if the condition fails to improve as anticipated.    Billie Ruddy, MD

## 2020-09-10 LAB — HM PAP SMEAR: HM Pap smear: NORMAL

## 2020-09-19 ENCOUNTER — Ambulatory Visit
Admission: RE | Admit: 2020-09-19 | Discharge: 2020-09-19 | Disposition: A | Discharge status: Home / Self Care | Payer: 59 | Source: Ambulatory Visit | Attending: Family Medicine | Admitting: Family Medicine

## 2020-09-19 ENCOUNTER — Other Ambulatory Visit: Payer: Self-pay

## 2020-09-19 DIAGNOSIS — Z1231 Encounter for screening mammogram for malignant neoplasm of breast: Secondary | ICD-10-CM

## 2020-09-19 IMAGING — MG MM DIGITAL SCREENING BILAT W/ TOMO AND CAD
8 series · 8 of 24 positions shown · non-contrast
Comparison: Previous exam(s).

CLINICAL DATA: Screening.

EXAM:
DIGITAL SCREENING BILATERAL MAMMOGRAM WITH TOMOSYNTHESIS AND CAD
TECHNIQUE: Bilateral screening digital craniocaudal and mediolateral oblique
mammograms were obtained. Bilateral screening digital breast
tomosynthesis was performed. The images were evaluated with
computer-aided detection.

[L CC synth-2D]
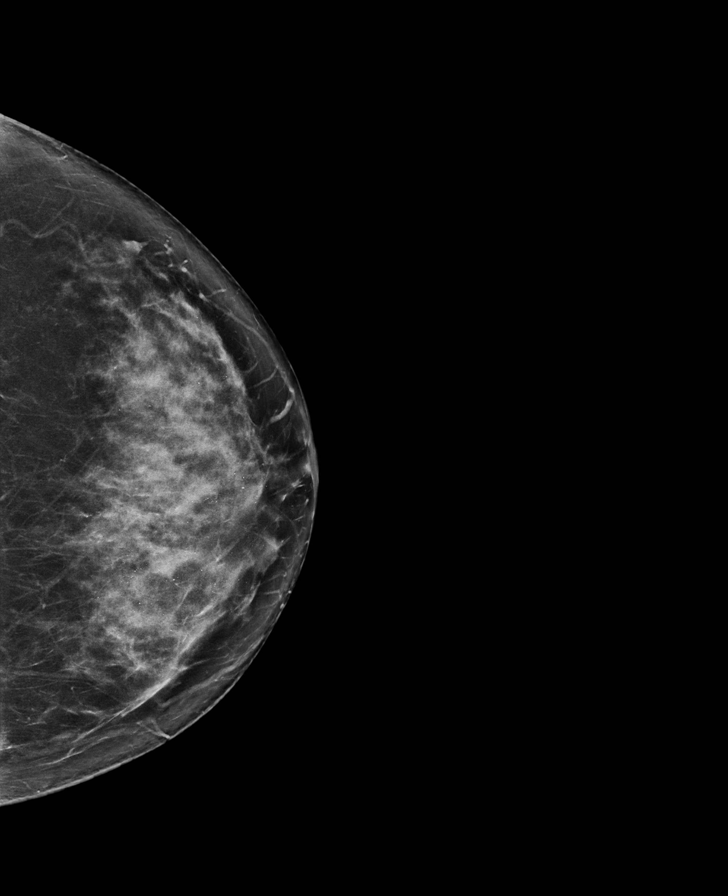

[R MLO synth-2D]
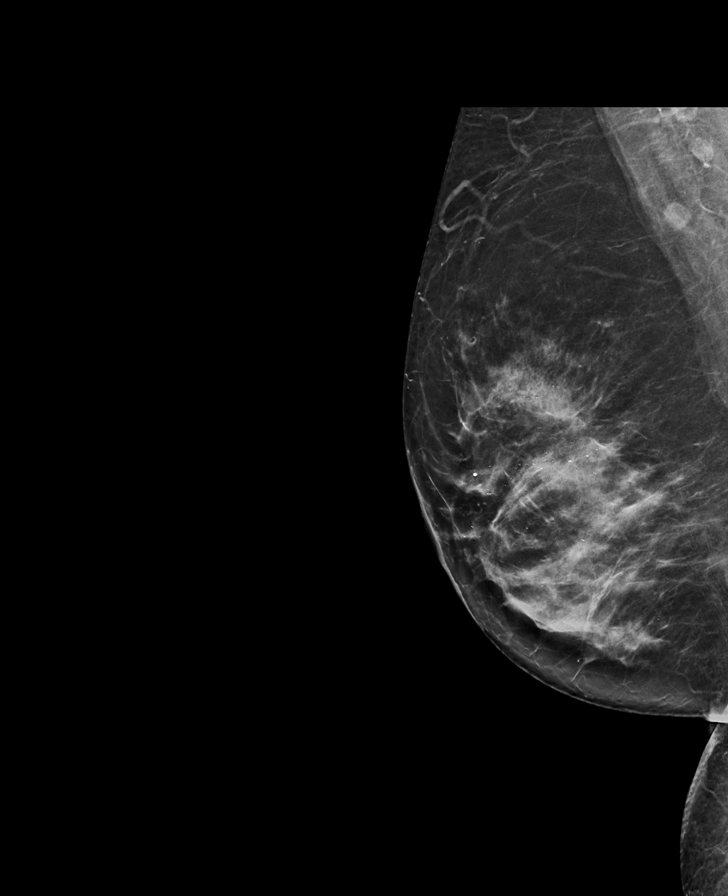

[R CC synth-2D]
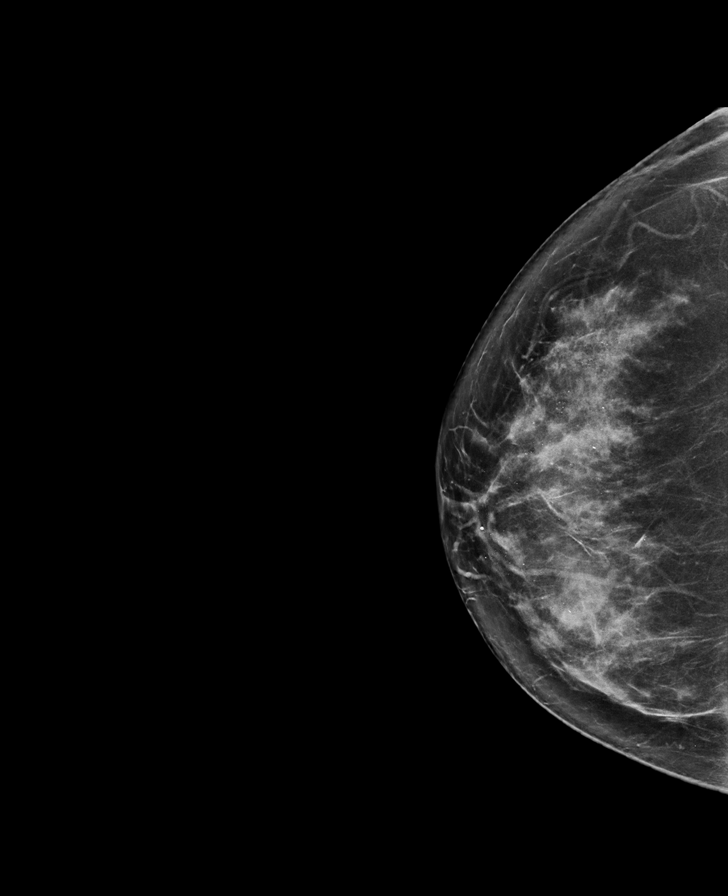

[L MLO synth-2D]
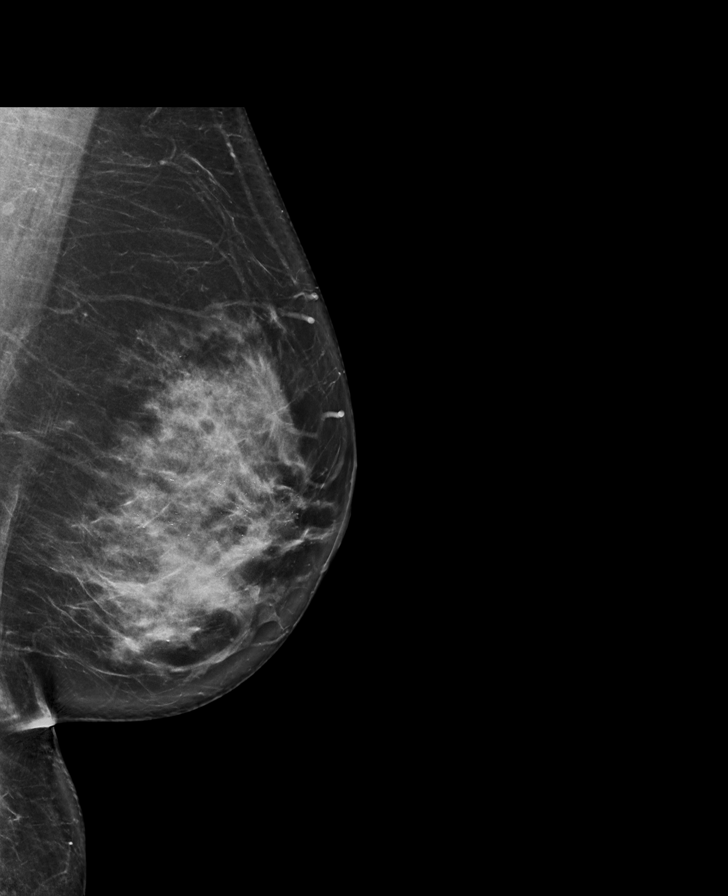

[R MLO tomo · tomo slice 37/74.0]
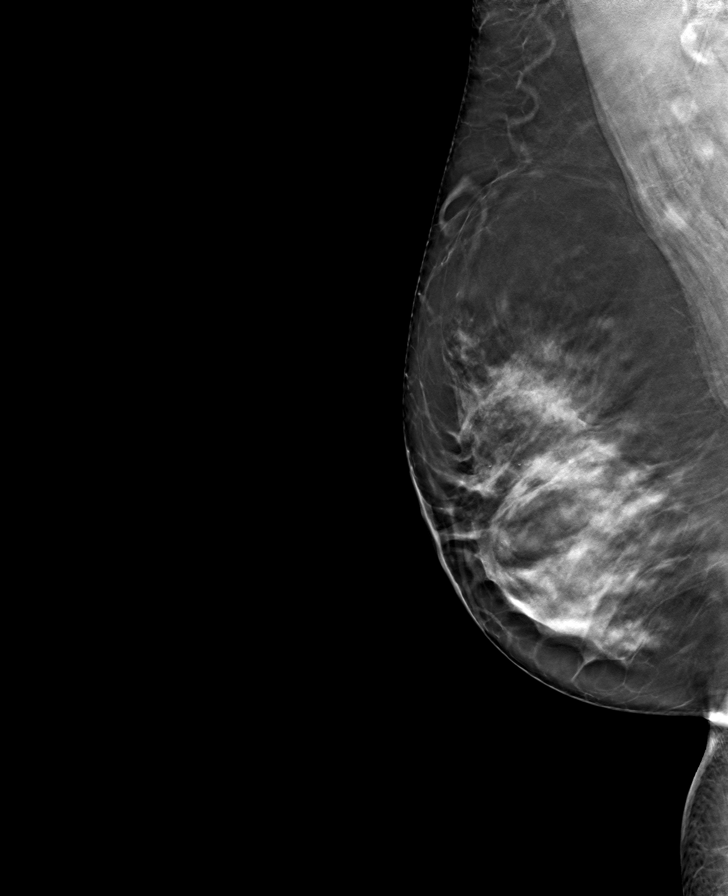

[L CC tomo · tomo slice 39/77.0]
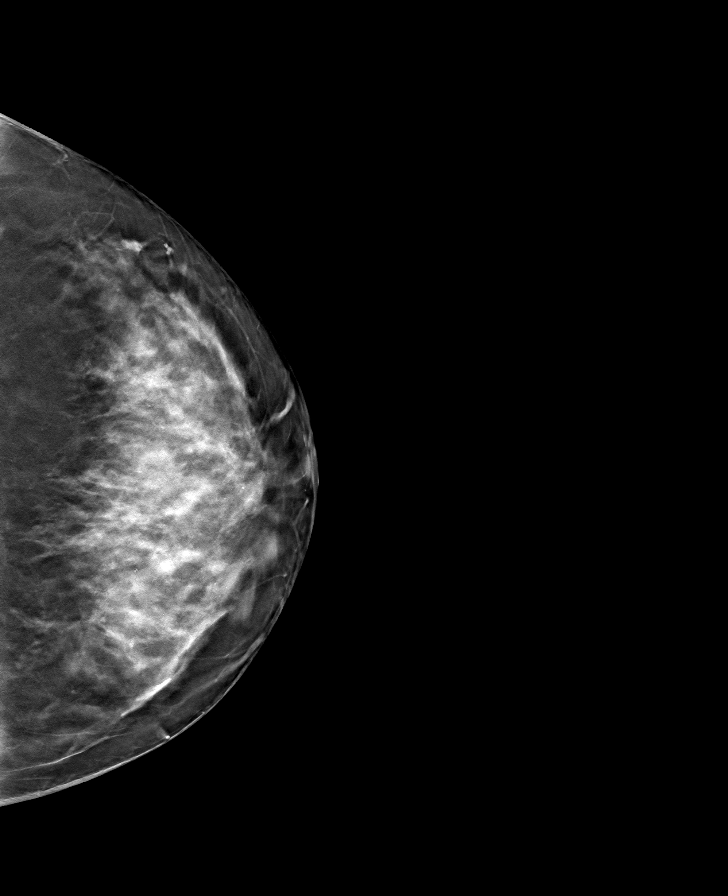

[R CC tomo · tomo slice 38/75.0]
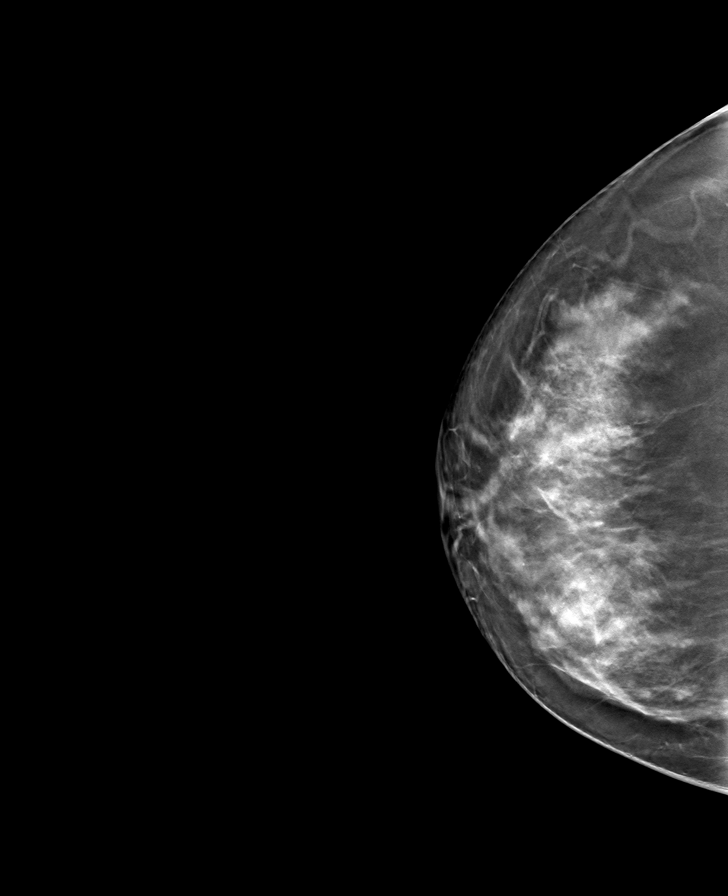

[L MLO tomo · tomo slice 43/84.0]
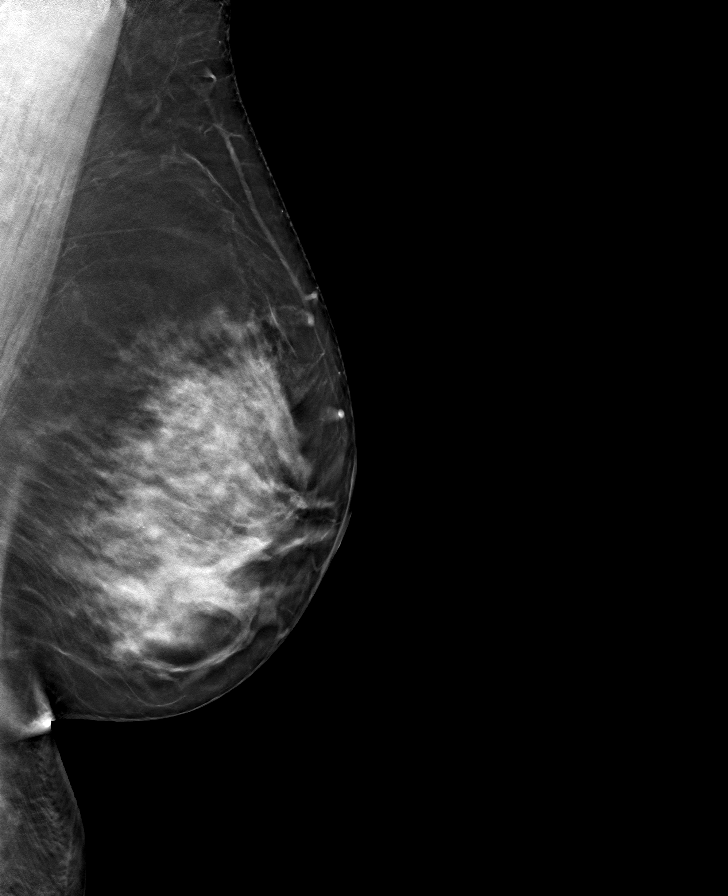

[8 of 24 positions shown; findings below may reference images not displayed]

ACR Breast Density Category c: The breast tissue is heterogeneously
dense, which may obscure small masses.
FINDINGS: In the left breast, possible distortion warrants further evaluation.
This possible distortion is seen within the outer LEFT breast, at
posterior depth, cc view only, slice 43.

In the right breast, no findings suspicious for malignancy.
IMPRESSION: Further evaluation is suggested for possible distortion in the left
breast.

RECOMMENDATION:
Diagnostic mammogram and possibly ultrasound of the left breast.
(Code:[S7])

The patient will be contacted regarding the findings, and additional
imaging will be scheduled.

BI-RADS CATEGORY  0: Incomplete. Need additional imaging evaluation
and/or prior mammograms for comparison.

## 2020-09-21 ENCOUNTER — Other Ambulatory Visit: Payer: Self-pay | Admitting: Family Medicine

## 2020-09-21 DIAGNOSIS — R928 Other abnormal and inconclusive findings on diagnostic imaging of breast: Secondary | ICD-10-CM

## 2020-10-10 ENCOUNTER — Other Ambulatory Visit: Payer: Self-pay

## 2020-10-10 ENCOUNTER — Ambulatory Visit
Admission: RE | Admit: 2020-10-10 | Discharge: 2020-10-10 | Disposition: A | Discharge status: Home / Self Care | Payer: 59 | Source: Ambulatory Visit | Attending: Family Medicine | Admitting: Family Medicine

## 2020-10-10 ENCOUNTER — Other Ambulatory Visit: Payer: Self-pay | Admitting: Family Medicine

## 2020-10-10 DIAGNOSIS — R928 Other abnormal and inconclusive findings on diagnostic imaging of breast: Secondary | ICD-10-CM

## 2020-10-10 IMAGING — MG MM DIGITAL DIAGNOSTIC UNILAT*L* W/ TOMO W/ CAD
8 series · 8 of 24 positions shown · non-contrast
Comparison: Previous exam(s).

CLINICAL DATA: 54-year-old female for further evaluation of
possible LEFT breast distortion on screening mammogram. History of
RIGHT breast complex sclerosing lesion and excision in [0W].

EXAM:
DIGITAL DIAGNOSTIC UNILATERAL LEFT MAMMOGRAM WITH TOMOSYNTHESIS AND
CAD; ULTRASOUND LEFT BREAST LIMITED
TECHNIQUE: Left digital diagnostic mammography and breast tomosynthesis was
performed. The images were evaluated with computer-aided detection.;
Targeted ultrasound examination of the left breast was performed

[L ML synth-2D (1 of 2)]
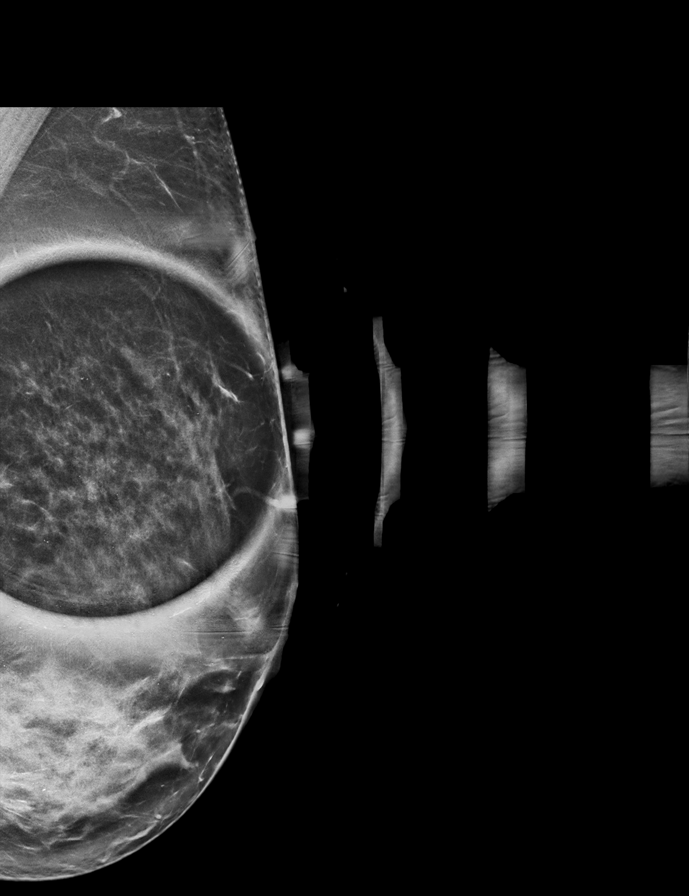

[L CC synth-2D (1 of 2)]
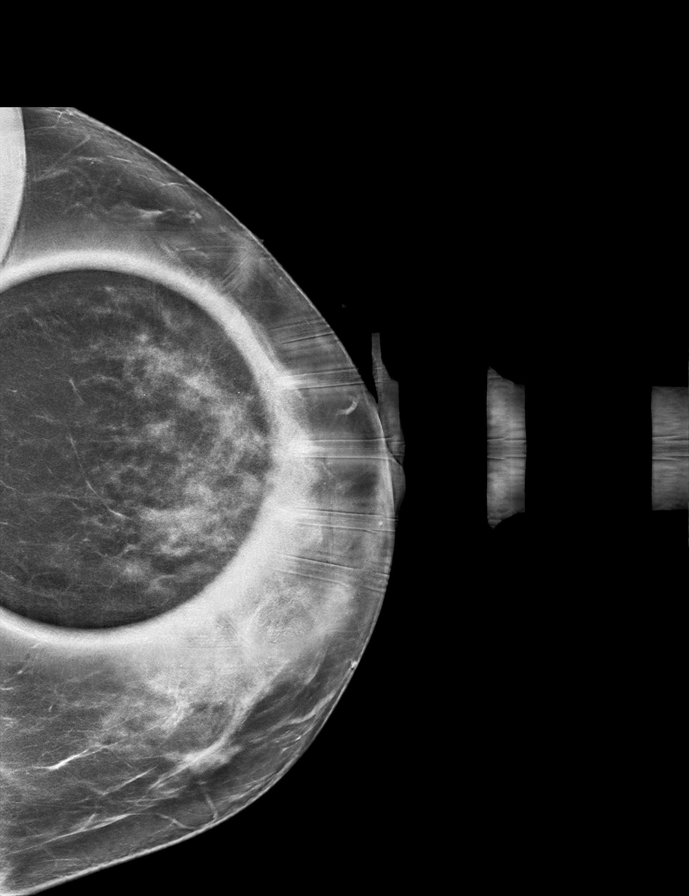

[L CC synth-2D (2 of 2)]
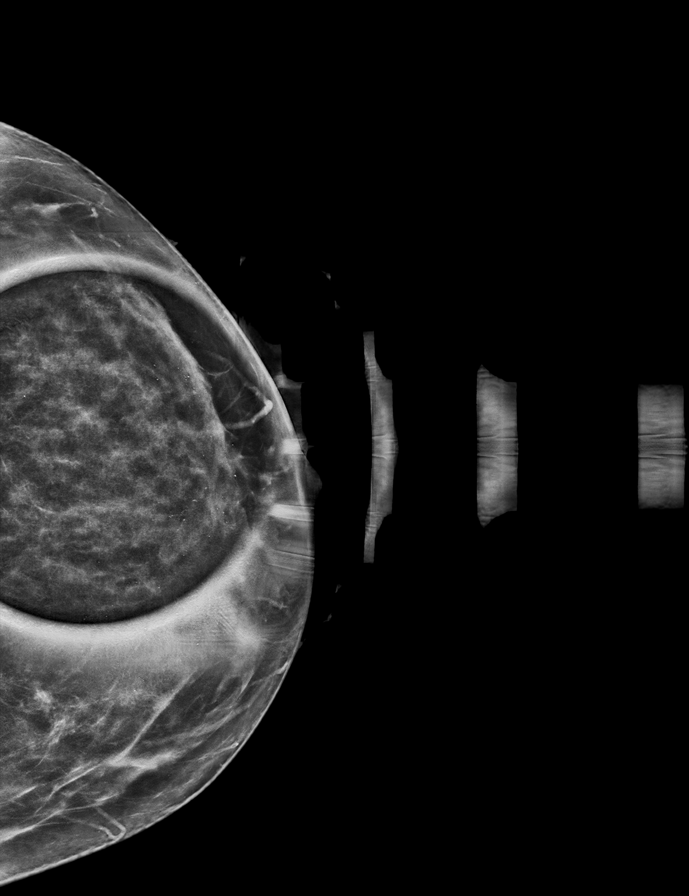

[L ML synth-2D (2 of 2)]
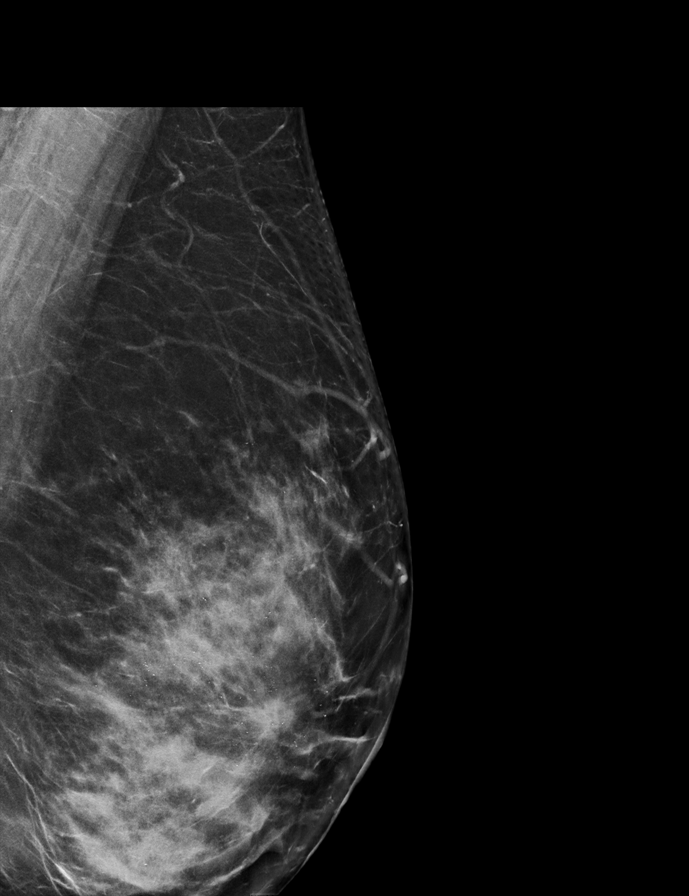

[L ML tomo (1 of 2) · tomo slice 39/76.0]
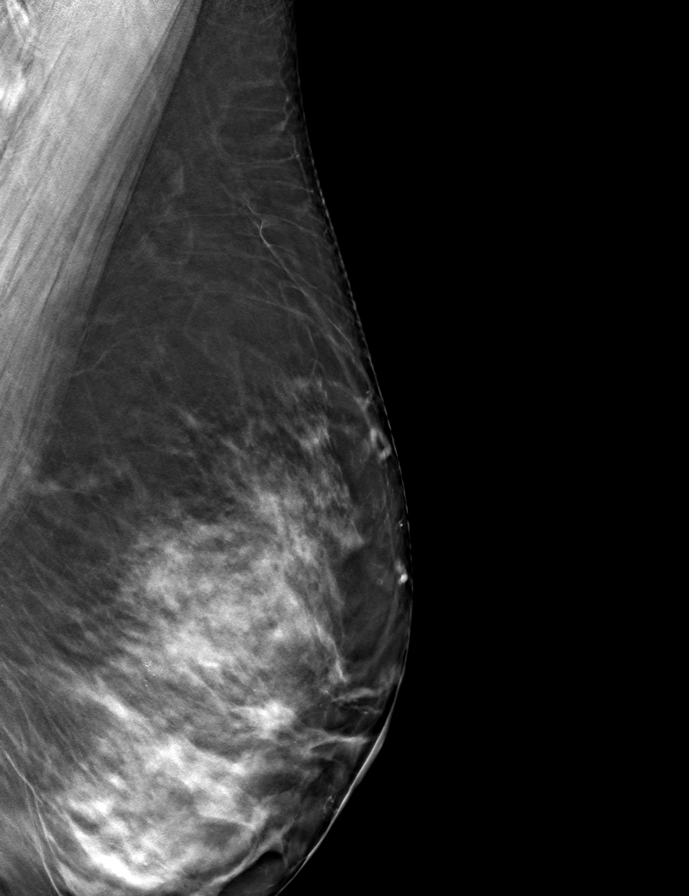

[L CC tomo (1 of 2) · tomo slice 37/73.0]
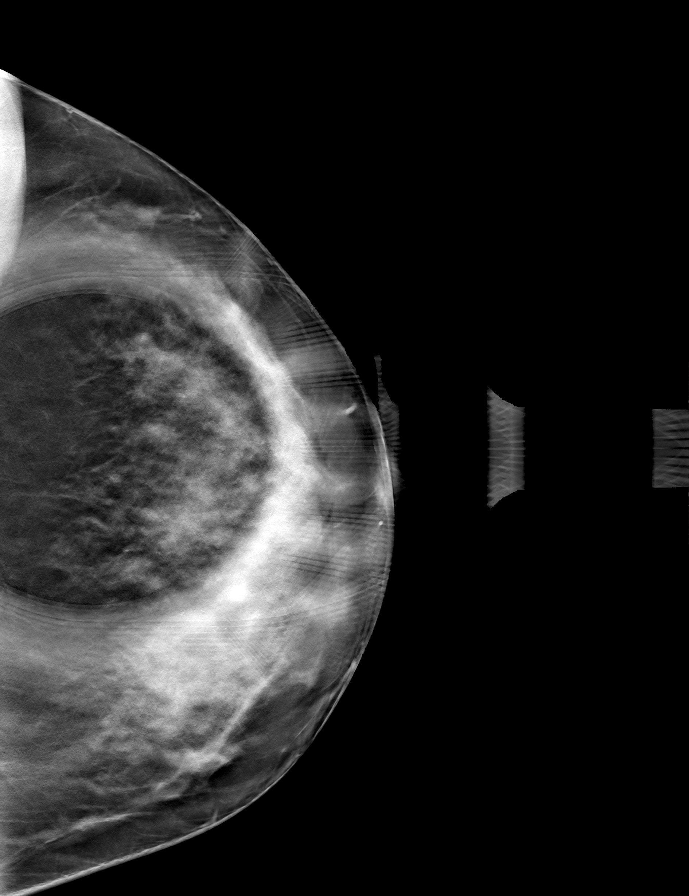

[L CC tomo (2 of 2) · tomo slice 33/64.0]
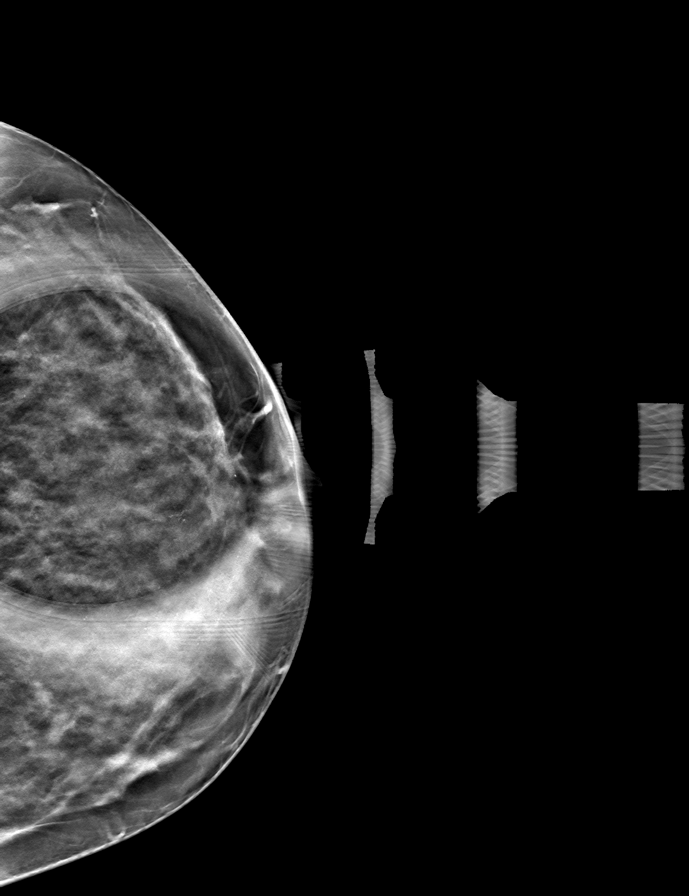

[L ML tomo (2 of 2) · tomo slice 35/68.0]
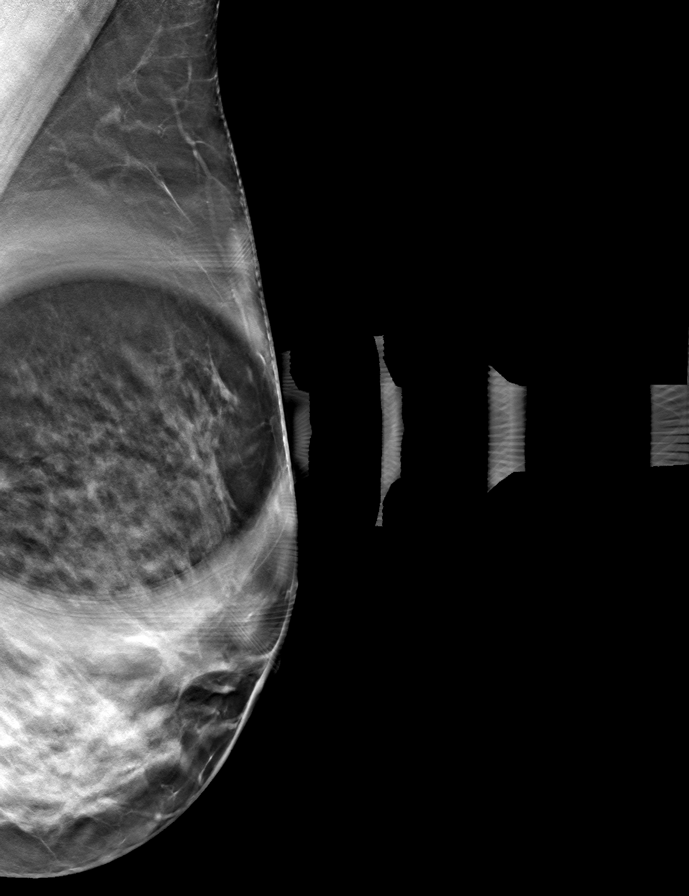

[8 of 24 positions shown; findings below may reference images not displayed]

ACR Breast Density Category c: The breast tissue is heterogeneously
dense, which may obscure small masses.
FINDINGS: 2D/3D full field and spot compression views of the LEFT breast
demonstrate an area of possible subtle distortion within the
UPPER-OUTER LEFT breast, middle depth.

Targeted ultrasound is performed, showing no suspicious mass,
distortion or worrisome shadowing within the UPPER-OUTER LEFT
breast.

No abnormal LEFT axillary lymph nodes are noted.
IMPRESSION: 1. Possible subtle distortion within the UPPER-OUTER LEFT breast,
without sonographic correlate. Tissue sampling is recommended.
2. No abnormal appearing LEFT axillary lymph nodes.

RECOMMENDATION:
3D/stereotactic guided LEFT breast biopsy, which will be scheduled.
If distortion is not identified at time of biopsy, six-month
follow-up would be recommended.

I have discussed the findings and recommendations with the patient.
If applicable, a reminder letter will be sent to the patient
regarding the next appointment.

BI-RADS CATEGORY  4: Suspicious.

## 2020-10-10 IMAGING — US US BREAST*L* LIMITED INC AXILLA
1 series · 9 of 9 positions shown · non-contrast
Comparison: Previous exam(s).

CLINICAL DATA: 54-year-old female for further evaluation of
possible LEFT breast distortion on screening mammogram. History of
RIGHT breast complex sclerosing lesion and excision in [0W].

EXAM:
DIGITAL DIAGNOSTIC UNILATERAL LEFT MAMMOGRAM WITH TOMOSYNTHESIS AND
CAD; ULTRASOUND LEFT BREAST LIMITED
TECHNIQUE: Left digital diagnostic mammography and breast tomosynthesis was
performed. The images were evaluated with computer-aided detection.;
Targeted ultrasound examination of the left breast was performed

[Series 1: us breast*left* limited inc axilla · 0.06mm/px · 9 of 9 slices shown]
[im 1/9]
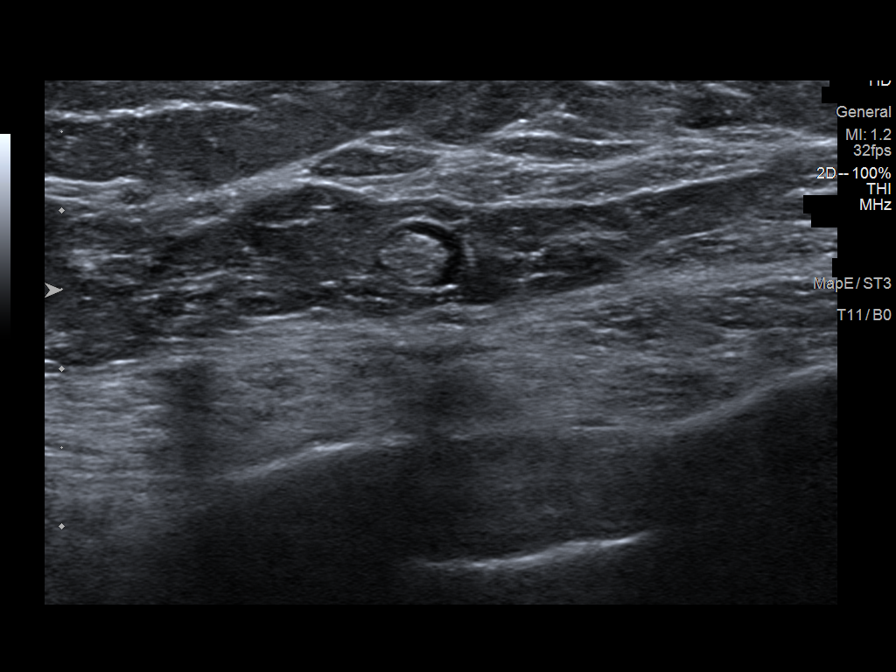
[im 2/9]
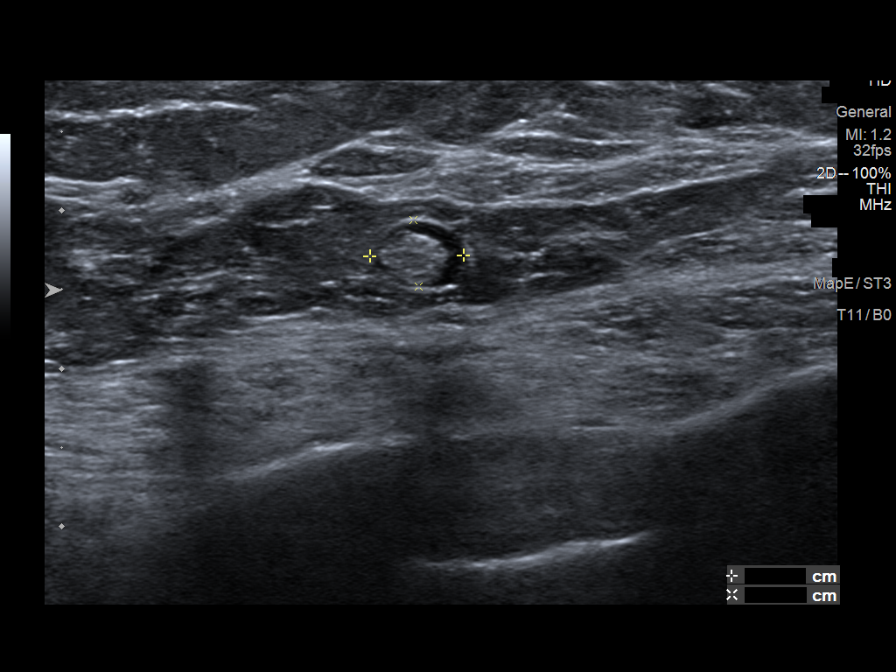
[im 3/9]
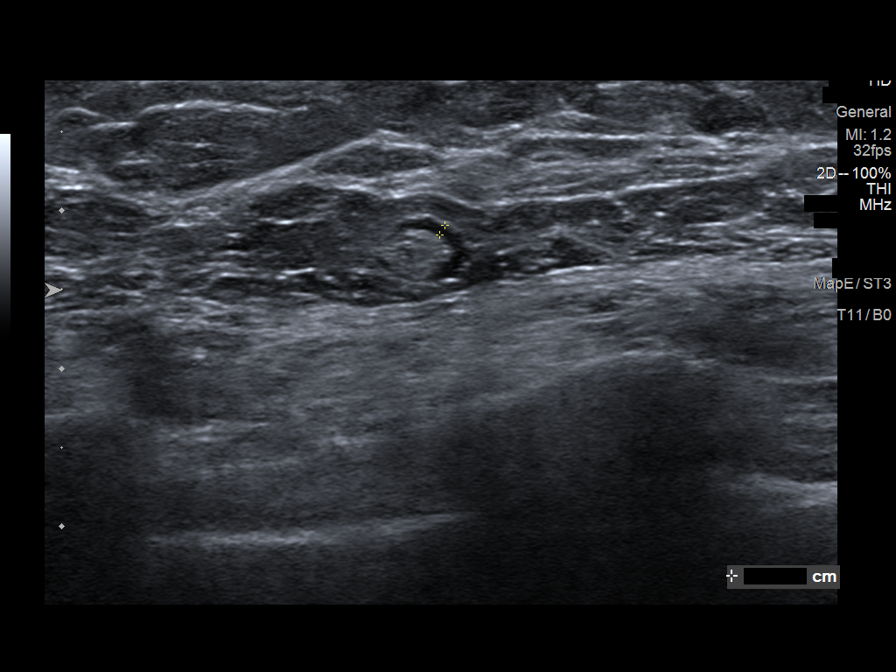
[im 4/9]
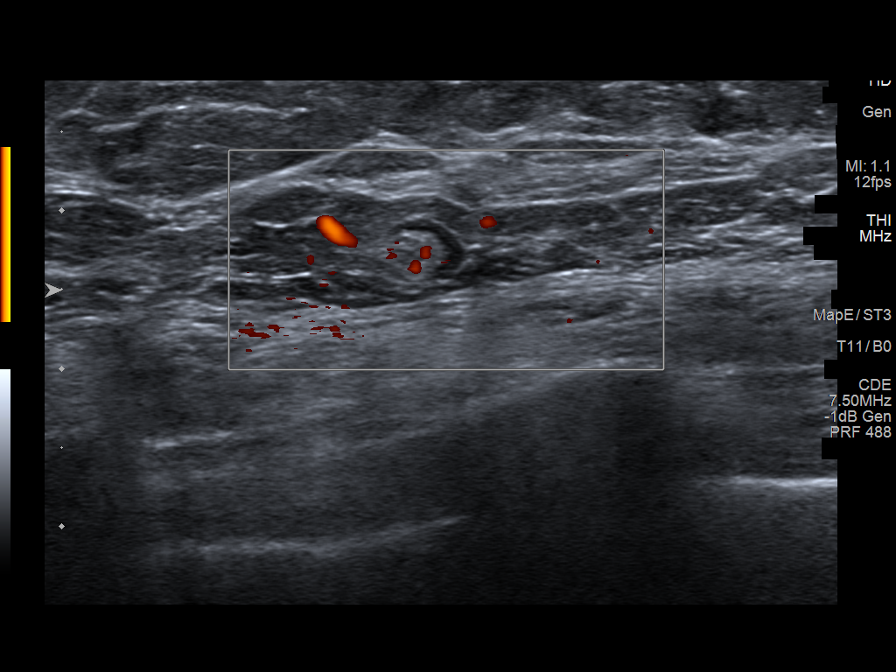
[im 5/9]
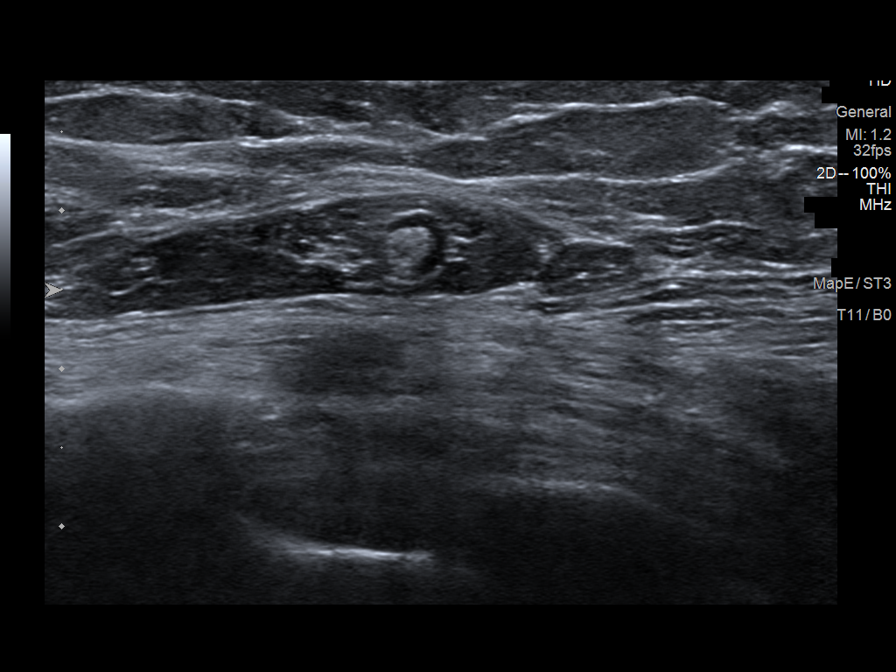
[im 6/9]
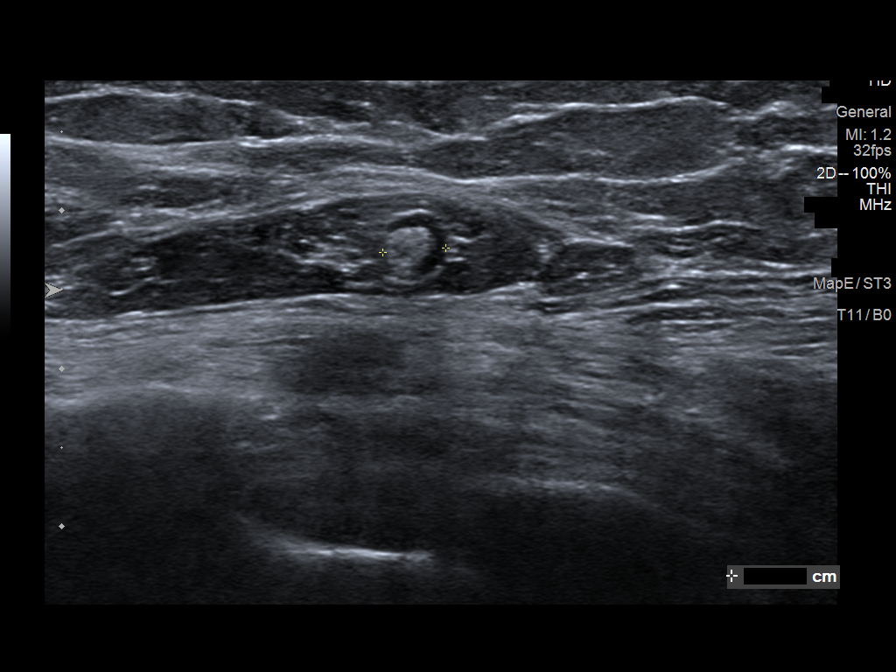
[im 7/9]
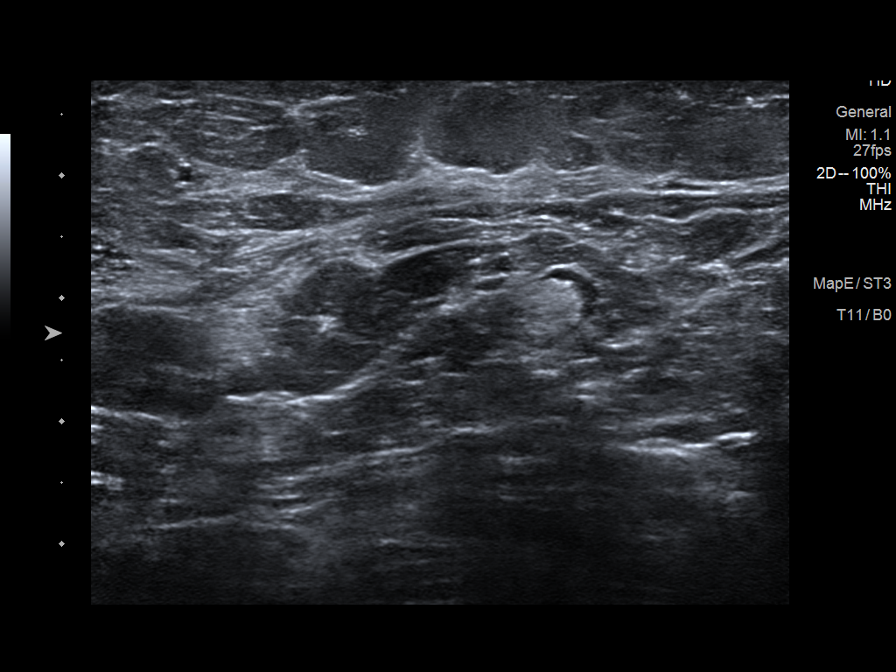
[im 8/9]
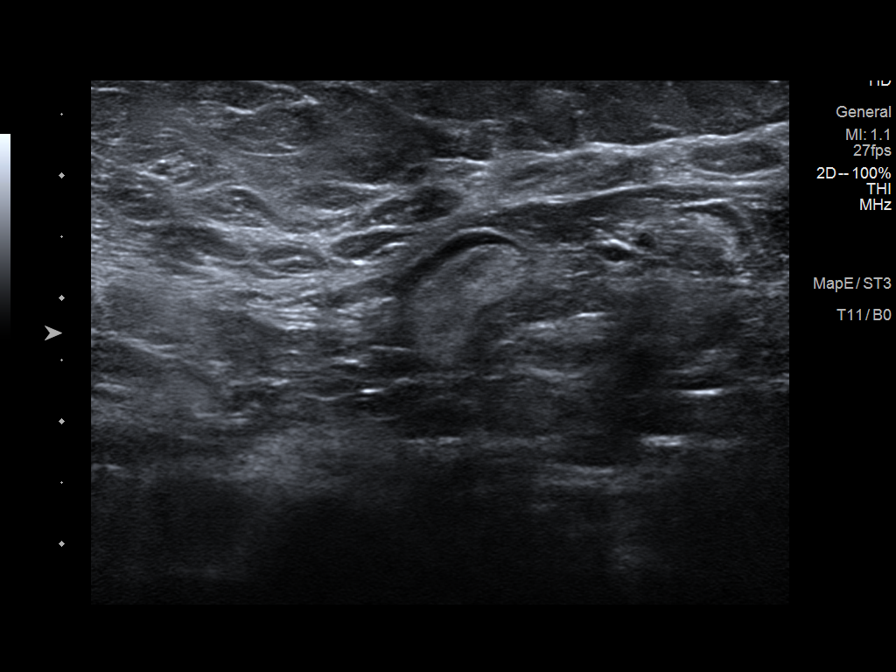
[im 9/9]
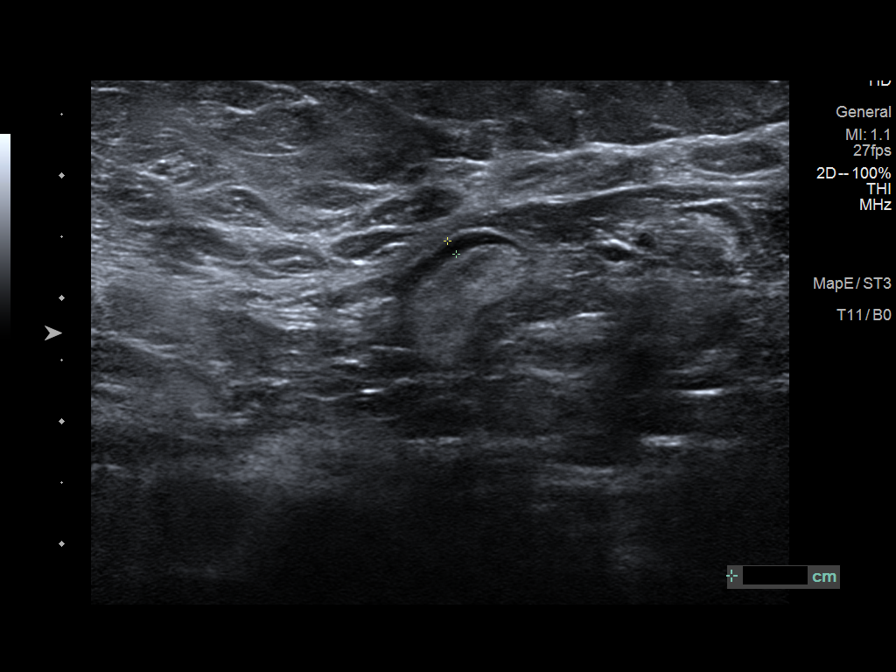

[9 of 9 positions shown; findings below may reference images not displayed]

ACR Breast Density Category c: The breast tissue is heterogeneously
dense, which may obscure small masses.
FINDINGS: 2D/3D full field and spot compression views of the LEFT breast
demonstrate an area of possible subtle distortion within the
UPPER-OUTER LEFT breast, middle depth.

Targeted ultrasound is performed, showing no suspicious mass,
distortion or worrisome shadowing within the UPPER-OUTER LEFT
breast.

No abnormal LEFT axillary lymph nodes are noted.
IMPRESSION: 1. Possible subtle distortion within the UPPER-OUTER LEFT breast,
without sonographic correlate. Tissue sampling is recommended.
2. No abnormal appearing LEFT axillary lymph nodes.

RECOMMENDATION:
3D/stereotactic guided LEFT breast biopsy, which will be scheduled.
If distortion is not identified at time of biopsy, six-month
follow-up would be recommended.

I have discussed the findings and recommendations with the patient.
If applicable, a reminder letter will be sent to the patient
regarding the next appointment.

BI-RADS CATEGORY  4: Suspicious.

## 2020-10-16 ENCOUNTER — Ambulatory Visit
Admission: RE | Admit: 2020-10-16 | Discharge: 2020-10-16 | Disposition: A | Discharge status: Home / Self Care | Payer: 59 | Source: Ambulatory Visit | Attending: Family Medicine | Admitting: Family Medicine

## 2020-10-16 ENCOUNTER — Other Ambulatory Visit: Payer: Self-pay

## 2020-10-16 DIAGNOSIS — R928 Other abnormal and inconclusive findings on diagnostic imaging of breast: Secondary | ICD-10-CM

## 2020-10-16 IMAGING — MG MM BREAST BX W LOC DEV 1ST LESION IMAGE BX SPEC STEREO GUIDE*L*
8 of 12 series · 8 of 24 positions shown · non-contrast
Comparison: Previous exams.
COMPARISON: Previous exams.

Addendum:
CLINICAL DATA: 54-year-old female with possible, subtle left breast
distortion.

EXAM:
LEFT BREAST STEREOTACTIC CORE NEEDLE BIOPSY

[L (1 of 7)]
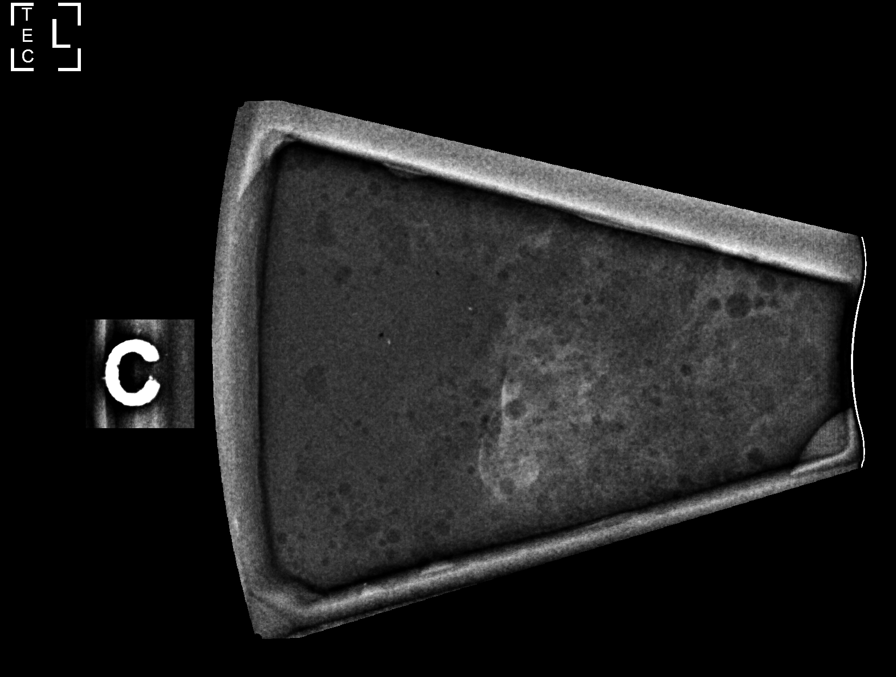

[L (2 of 7)]
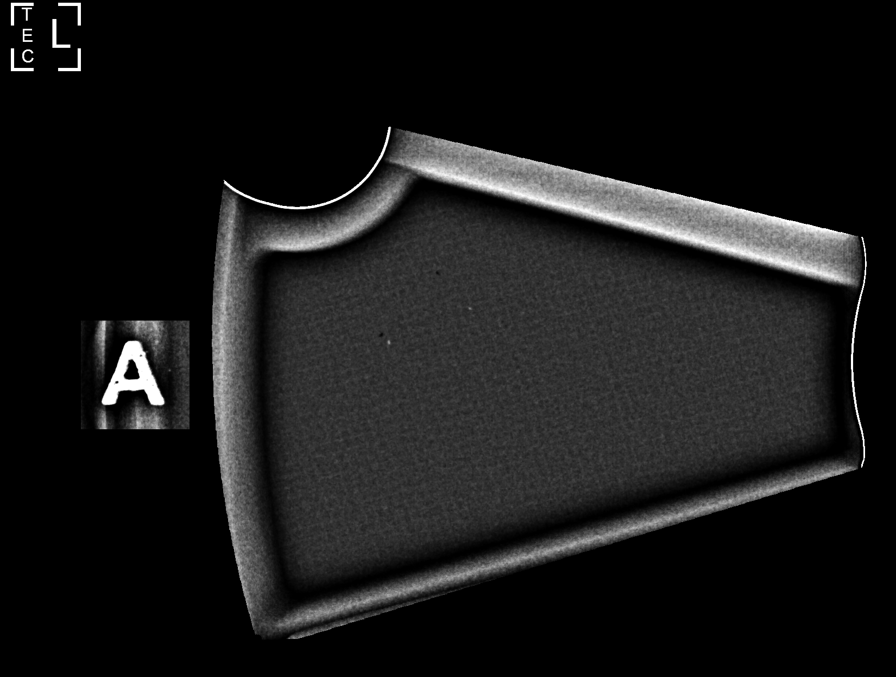

[L (3 of 7)]
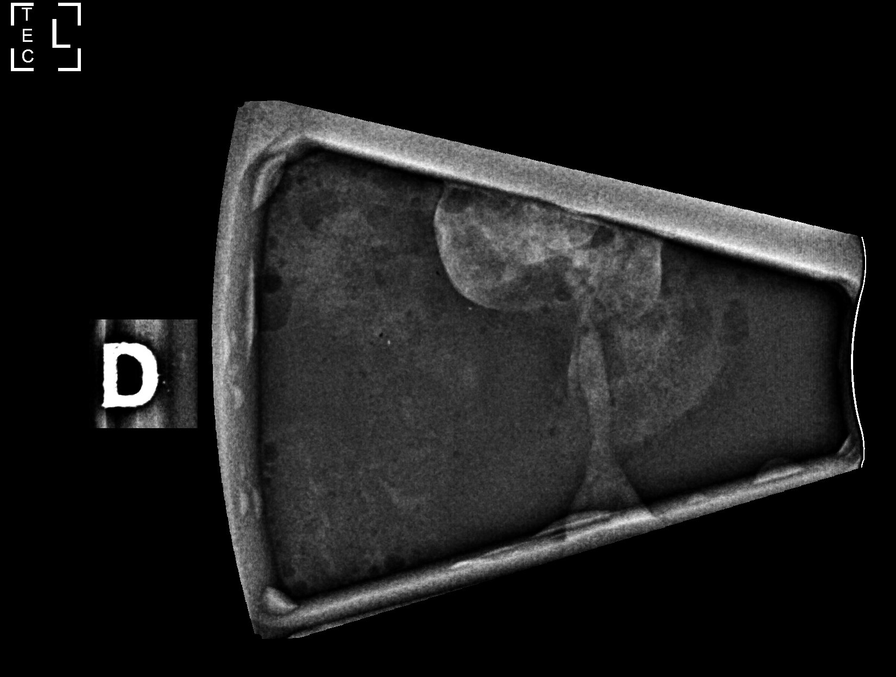

[L (4 of 7)]
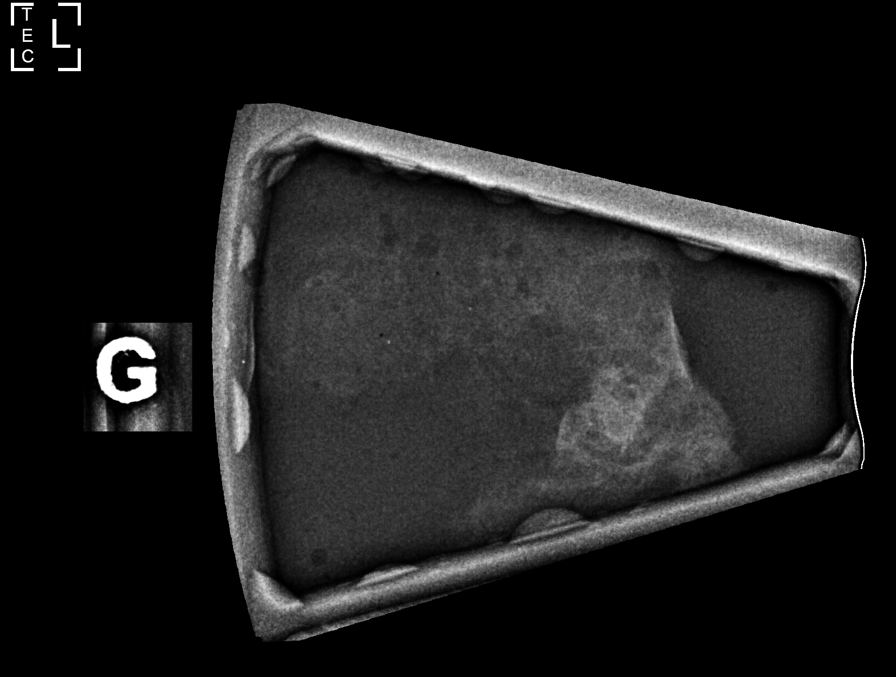

[L (5 of 7)]
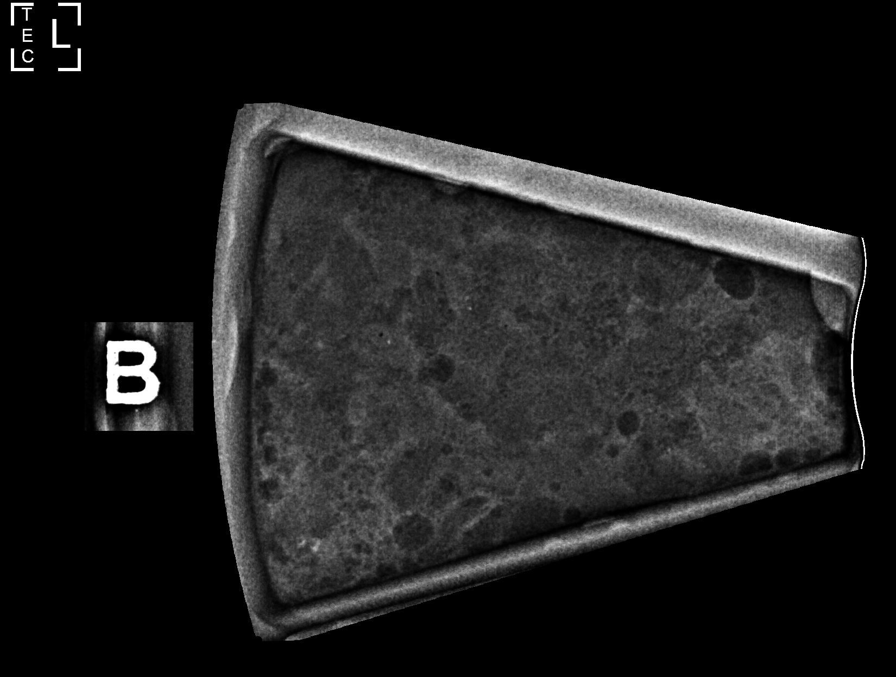

[L (6 of 7)]
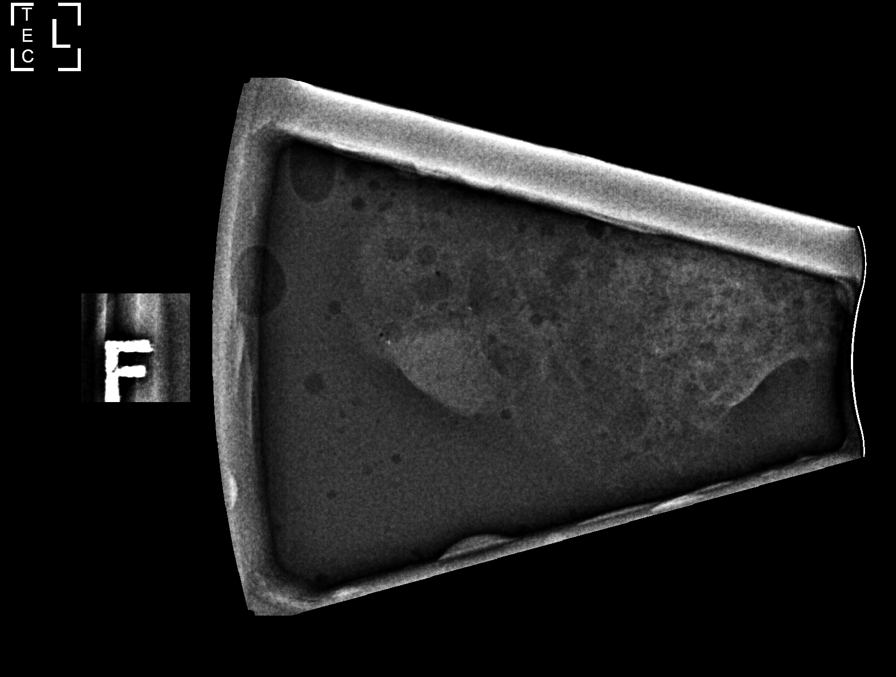

[L (7 of 7)]
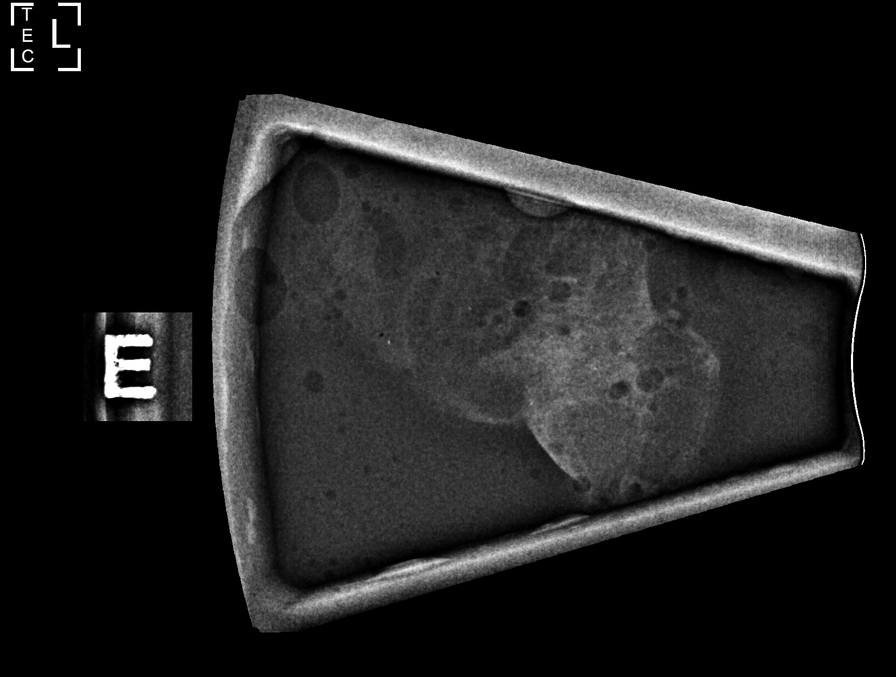

[L CC]
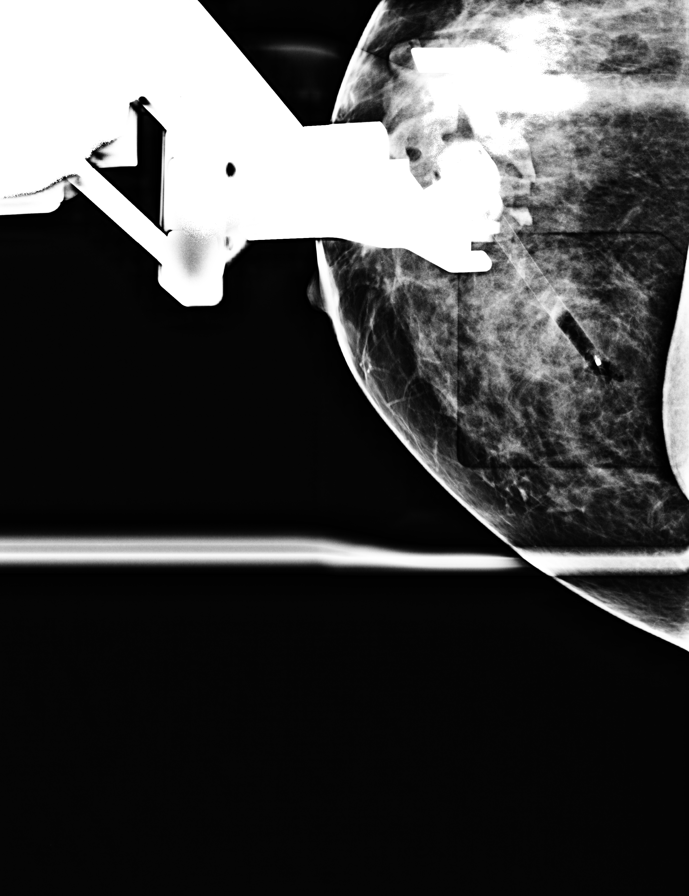

[8 of 24 positions shown; findings below may reference images not displayed]



Using sterile technique and 1% Lidocaine as local anesthetic, under
stereotactic guidance, a 9 gauge vacuum assisted device was used to
perform core needle biopsy of possible, subtle distortion in the
upper-outer quadrant of the left breast using a superior approach.

Lesion quadrant: Upper outer quadrant

At the conclusion of the procedure, a coil shaped tissue marker clip
was deployed into the biopsy cavity. Follow-up 2-view mammogram was
performed and dictated separately.
IMPRESSION: Stereotactic-guided biopsy of the left breast. No apparent
complications.

ADDENDUM:
Pathology revealed INTERMEDIATE GRADE DUCTAL CARCINOMA IN SITU WITH
CALCIFICATIONS of the Left breast, upper outer quadrant, posterior.
This was found to be concordant by Dr. KODA.

Pathology results were discussed with the patient by telephone. The
patient reported doing well after the biopsy with tenderness at the
site. Post biopsy instructions and care were reviewed and questions
were answered. The patient was encouraged to call The [REDACTED] for any additional concerns. My direct phone
number was provided.

Surgical consultation has been arranged with Dr. KODA at
[REDACTED] on [DATE].

Consider further evaluation with breast MRI given the patient's
breast density.

Pathology results reported by KODA, RN on [DATE].



Using sterile technique and 1% Lidocaine as local anesthetic, under
stereotactic guidance, a 9 gauge vacuum assisted device was used to
perform core needle biopsy of possible, subtle distortion in the
upper-outer quadrant of the left breast using a superior approach.

Lesion quadrant: Upper outer quadrant

At the conclusion of the procedure, a coil shaped tissue marker clip
was deployed into the biopsy cavity. Follow-up 2-view mammogram was
performed and dictated separately.
IMPRESSION: Stereotactic-guided biopsy of the left breast. No apparent
complications.

## 2020-10-16 IMAGING — MG MM BREAST LOCALIZATION CLIP
4 series · 4 of 12 positions shown · non-contrast
Comparison: Previous exam(s).

CLINICAL DATA: 54-year-old female status post stereotactic guided
biopsy of possible left breast distortion.

EXAM:
DIAGNOSTIC LEFT MAMMOGRAM POST STEREOTACTIC BIOPSY

[L CC synth-2D]
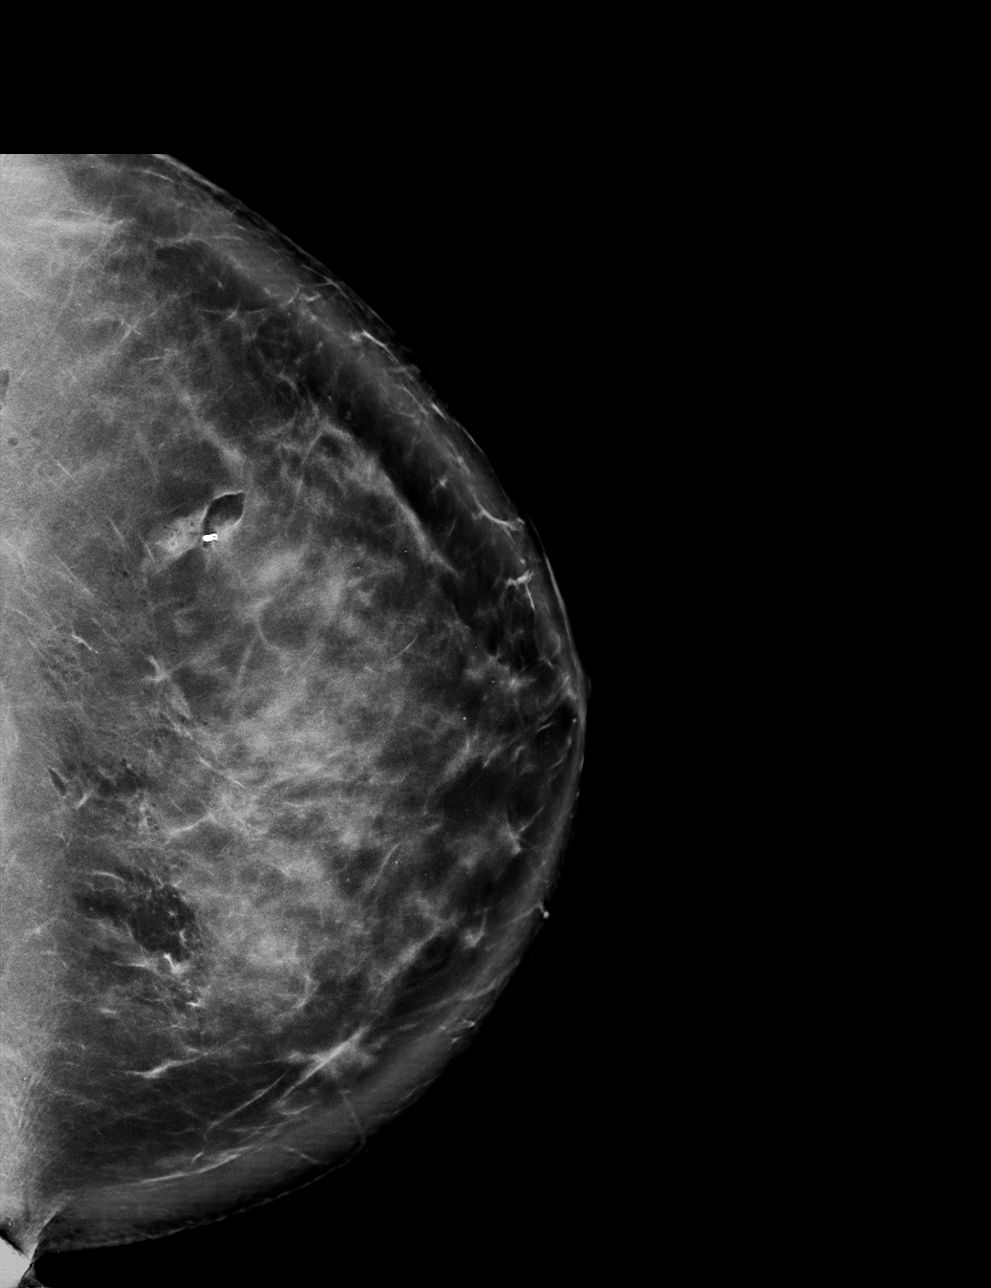

[L ML synth-2D]
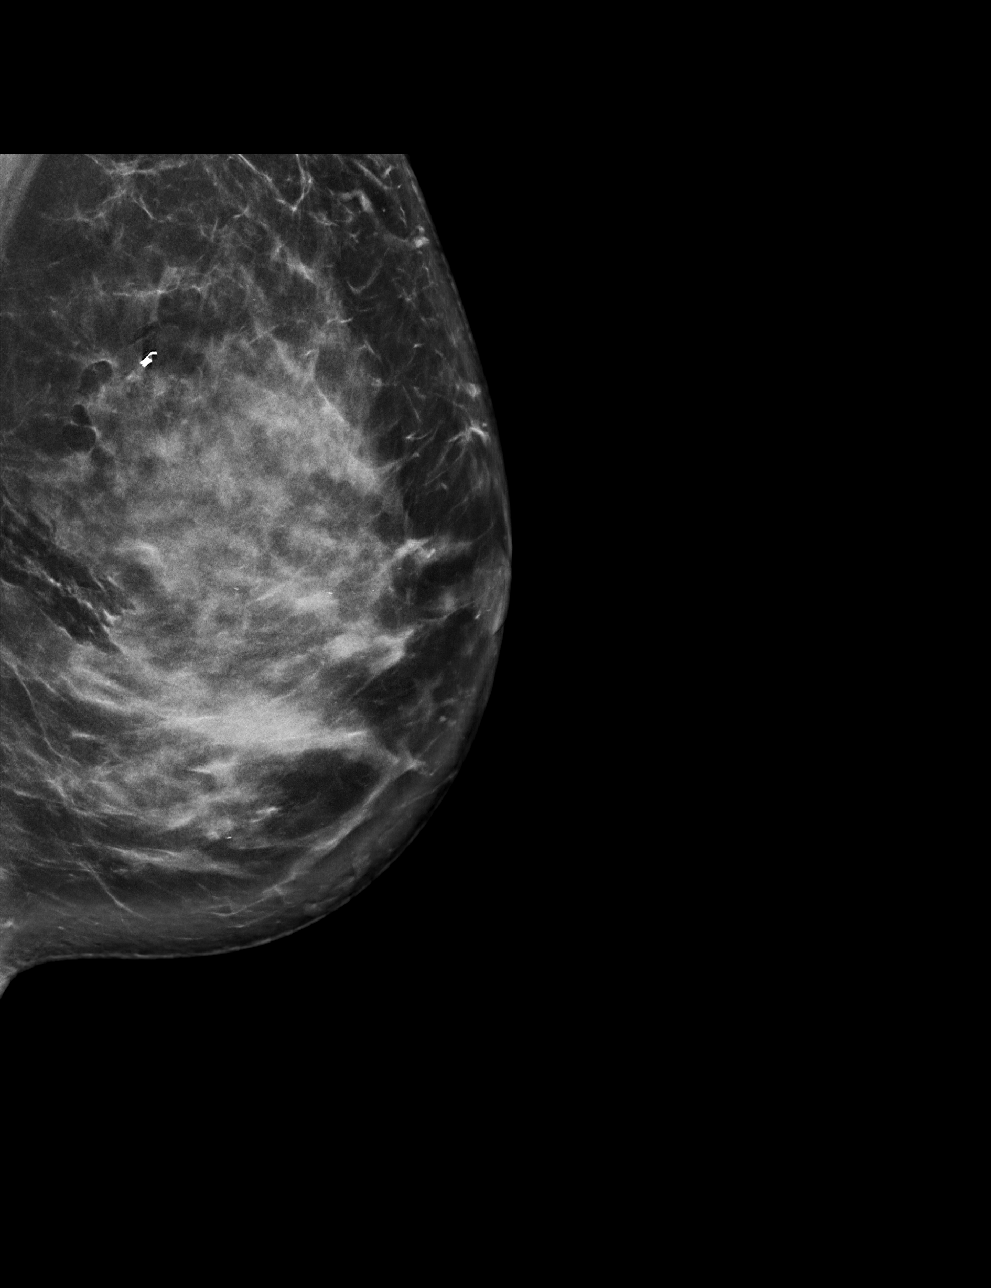

[L ML tomo · tomo slice 48/95.0]
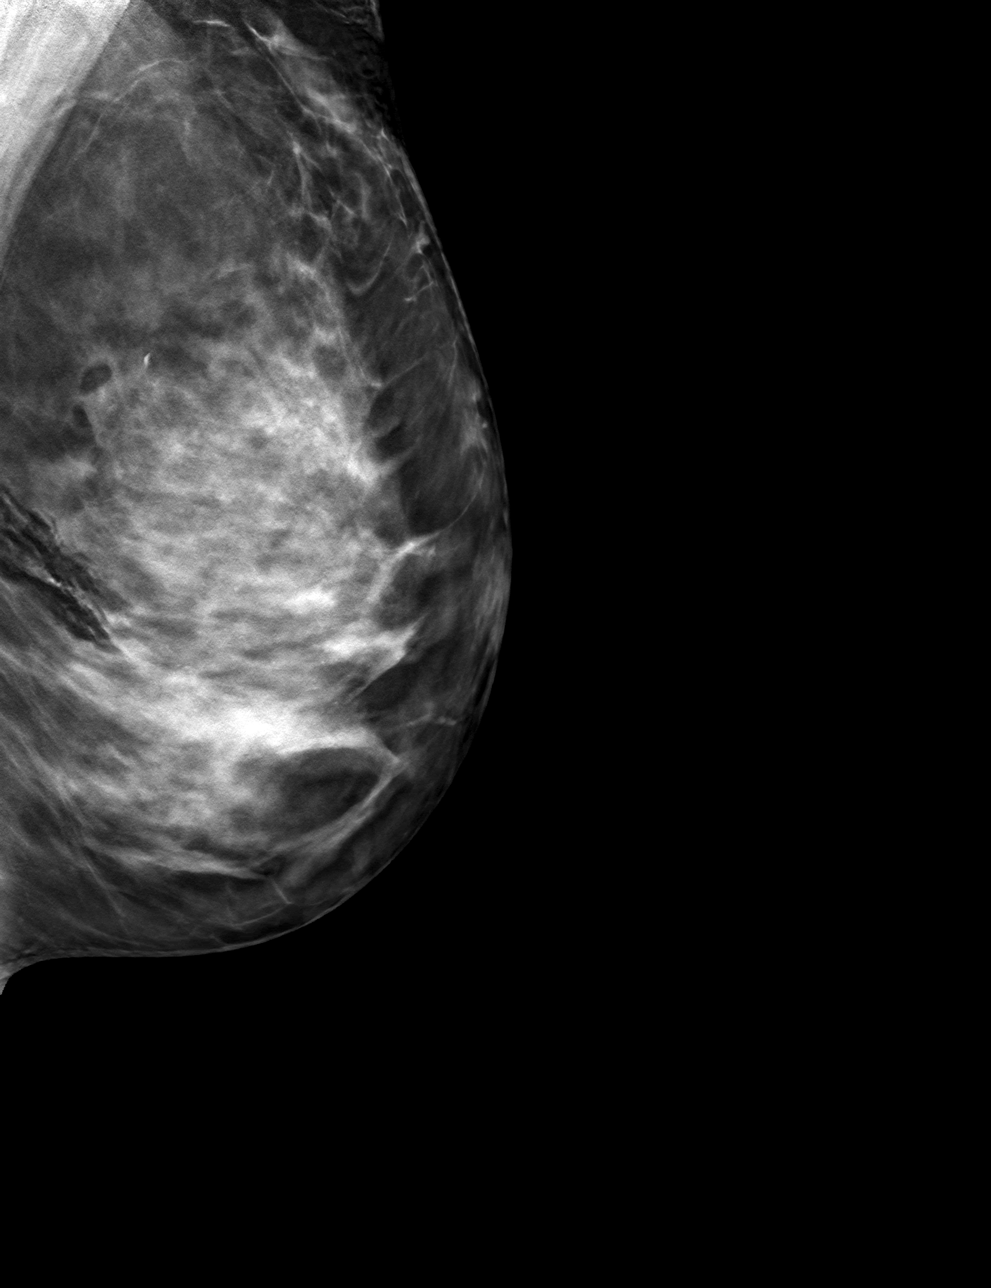

[L CC tomo · tomo slice 54/107.0]
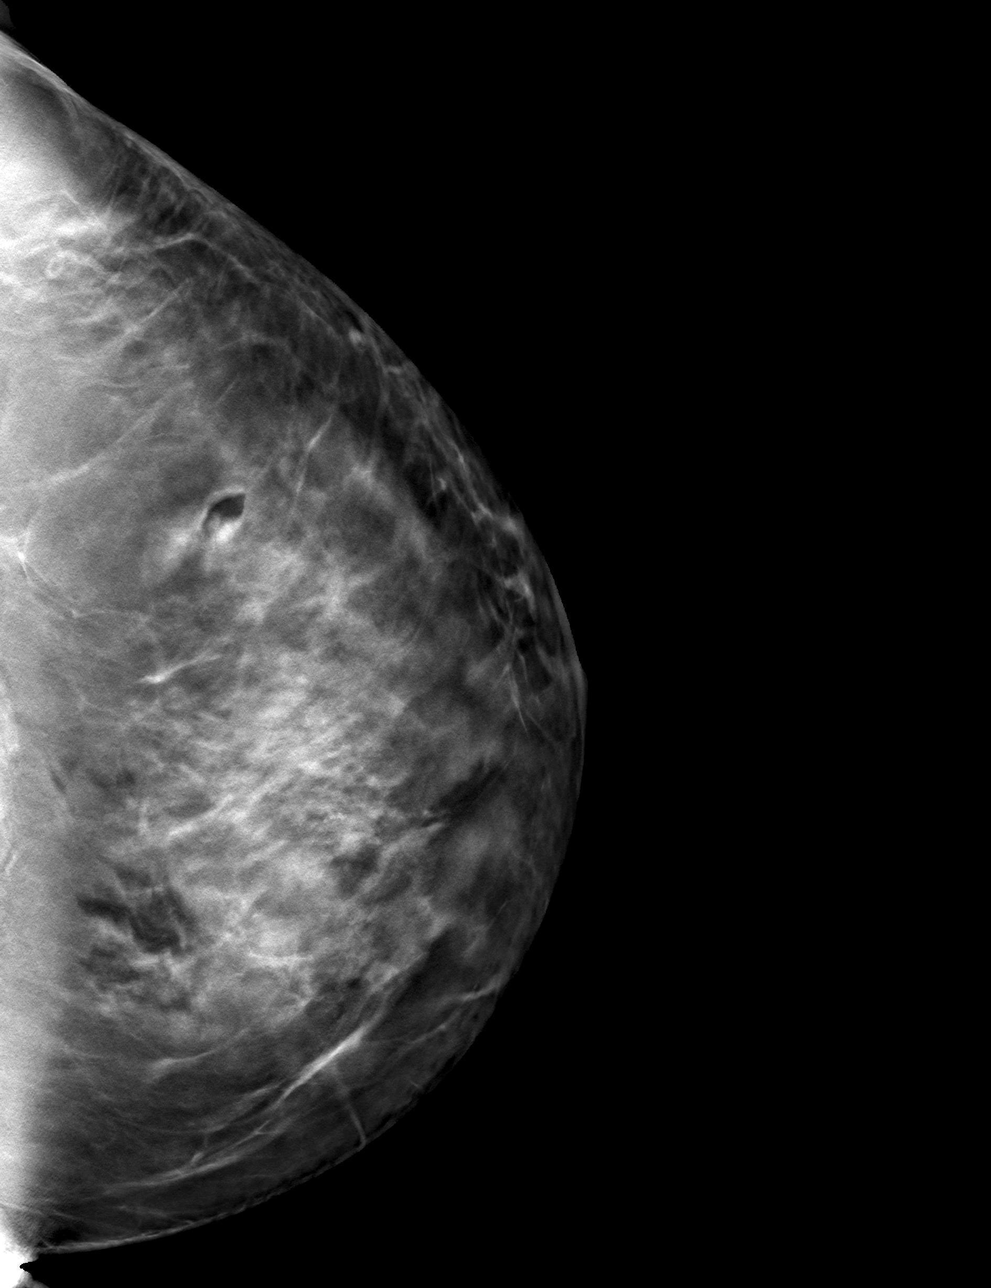

[4 of 12 positions shown; findings below may reference images not displayed]

FINDINGS: Mammographic images were obtained following stereotactic guided
biopsy of the left breast. The biopsy marking clip is in expected
position at the site of biopsy.
IMPRESSION: Appropriate positioning of the coil shaped biopsy marking clip at
the site of biopsy in the upper-outer left breast.

Final Assessment: Post Procedure Mammograms for Marker Placement

## 2020-10-24 ENCOUNTER — Encounter: Payer: Self-pay | Admitting: Family Medicine

## 2020-11-05 ENCOUNTER — Telehealth: Payer: Self-pay | Admitting: Genetic Counselor

## 2020-11-05 ENCOUNTER — Other Ambulatory Visit: Payer: Self-pay | Admitting: General Surgery

## 2020-11-05 DIAGNOSIS — D0512 Intraductal carcinoma in situ of left breast: Secondary | ICD-10-CM

## 2020-11-05 NOTE — Telephone Encounter (Signed)
Received an urgent genetic counseling referral from Dr. Donne Hazel for breast cancer. [name] returned my call and has been scheduled for genetics on 5/5 at 4pm. Pt aware to arrive 15 minutes.

## 2020-11-07 ENCOUNTER — Telehealth: Payer: 59 | Admitting: Family Medicine

## 2020-11-07 ENCOUNTER — Encounter: Payer: Self-pay | Admitting: Family Medicine

## 2020-11-07 DIAGNOSIS — G4700 Insomnia, unspecified: Secondary | ICD-10-CM | POA: Diagnosis not present

## 2020-11-07 DIAGNOSIS — D0512 Intraductal carcinoma in situ of left breast: Secondary | ICD-10-CM

## 2020-11-07 DIAGNOSIS — F3289 Other specified depressive episodes: Secondary | ICD-10-CM

## 2020-11-07 MED ORDER — SERTRALINE HCL 50 MG PO TABS: 50.0000 mg | ORAL_TABLET | Freq: Every day | ORAL | 4 refills | Status: DC

## 2020-11-07 NOTE — Progress Notes (Signed)
Virtual Visit via Video Note  I connected with Tara Yates on 11/07/20 at  4:30 PM EDT by a video enabled telemedicine application 2/2 [id number] pandemic and verified that I am speaking with the correct person using two identifiers.  Location patient: home Location provider:work or home office Persons participating in the virtual visit: patient, provider  I discussed the limitations of evaluation and management by telemedicine and the availability of in person appointments. The patient expressed understanding and agreed to proceed.   HPI: Pt dealing with increased stress.  Having a lumpectomy in the next few wks for recently diagnosed DCIS.  Waiting on results from genetic markers.  Read possible s/e of Tamoxifen which made her anxious.  Pt also dealing with stress in her personal life as she has just ended a relationship.  Taking Zoloft 25 mg and in therapy, but notes increased depression symptoms. Interested in increasing the dose of zoloft.  Pt running and working out with a Physiological scientist for self care.  Pt lost 24 lbs.  Also eating cleaner by cutting out sugar and other foods that can increase inflammation.  May have a glass of wine.  Having difficult staying asleep.  Taking melatonin before bed.  May wake up around 2 or 3 am.  At times mind races thinking about various things.    ROS: See pertinent positives and negatives per HPI.  Past Medical History:  Diagnosis Date  . Allergy   . Anxiety   . Back pain   . Constipation   . Depression   . Hyperlipidemia   . Hypothyroidism   . Joint pain   . Plantar fasciitis, bilateral   . SOBOE (shortness of breath on exertion)     Past Surgical History:  Procedure Laterality Date  . BREAST BIOPSY Right 02/28/2015  . BREAST EXCISIONAL BIOPSY Right 03/29/2015   had seed placement as wel  . ENDOMETRIAL ABLATION    . MOUTH SURGERY    . RADIOACTIVE SEED GUIDED EXCISIONAL BREAST BIOPSY Right 04/03/2015   Procedure: RADIOACTIVE SEED GUIDED  EXCISIONAL BREAST BIOPSY;  Surgeon: Rolm Bookbinder, MD;  Location: [facility];  Service: General;  Laterality: Right;    Family History  Problem Relation Age of Onset  . Heart disease Mother   . Heart failure Mother   . Heart attack Mother   . Hyperlipidemia Mother   . Sudden death Mother   . Heart disease Father   . Hyperlipidemia Father   . Hypertension Father   . Thyroid disease Father   . Sleep apnea Father   . Obesity Father   . Breast cancer Neg Hx   . Colon cancer Neg Hx   . Esophageal cancer Neg Hx   . Rectal cancer Neg Hx   . Stomach cancer Neg Hx      Current Outpatient Medications:  .  levothyroxine (SYNTHROID) 25 MCG tablet, TAKE 1 TABLET(25 MCG) BY MOUTH DAILY BEFORE BREAKFAST, Disp: 90 tablet, Rfl: 1 .  polyethylene glycol (MIRALAX / GLYCOLAX) 17 g packet, Take 17 g by mouth daily., Disp: , Rfl:  .  sertraline (ZOLOFT) 25 MG tablet, Take 1 tablet (25 mg total) by mouth daily., Disp: 30 tablet, Rfl: 3  EXAM:  VITALS per patient if applicable:  RR between 12-20 bpm  GENERAL: alert, oriented, appears well and in no acute distress  HEENT: atraumatic, conjunctiva clear, no obvious abnormalities on inspection of external nose and ears  NECK: normal movements of the head and neck  LUNGS:  on inspection no signs of respiratory distress, breathing rate appears normal, no obvious gross SOB, gasping or wheezing  CV: no obvious cyanosis  MS: moves all visible extremities without noticeable abnormality  PSYCH/NEURO: pleasant and cooperative, no obvious depression or anxiety, speech and thought processing grossly intact  ASSESSMENT AND PLAN:  Discussed the following assessment and plan:  Other depression  -increased symptoms -discussed continuing self care such as exercise -Continued therapy -We will increase dose of Zoloft from 25 mg daily to 50 mg daily - Plan: sertraline (ZOLOFT) 50 MG tablet  Insomnia, unspecified type -Discussed sleep  hygiene -Consider journaling to help with anxiety. -Discussed taking melatonin earlier -Continue to monitor -Consider hydroxyzine if needed for continued sleep difficulty  Ductal carcinoma in situ (DCIS) of left breast -Left breast distortion noted on mammogram 10/20/2020.  Biopsy recommended after diagnostic mammogram and u/s on 10/10/2020 with continued distortion in the left breast. -s/p bxp -Lumpectomy planned for 12/18/2020 -Continue follow-up with oncology  Follow-up in 4-6 weeks, sooner if needed   I discussed the assessment and treatment plan with the patient. The patient was provided an opportunity to ask questions and all were answered. The patient agreed with the plan and demonstrated an understanding of the instructions.   The patient was advised to call back or seek an in-person evaluation if the symptoms worsen or if the condition fails to improve as anticipated.    Billie Ruddy, MD

## 2020-11-08 ENCOUNTER — Inpatient Hospital Stay: Payer: 59 | Attending: Genetic Counselor | Admitting: Genetic Counselor

## 2020-11-08 ENCOUNTER — Other Ambulatory Visit: Payer: Self-pay

## 2020-11-08 ENCOUNTER — Inpatient Hospital Stay: Payer: 59

## 2020-11-08 DIAGNOSIS — Z803 Family history of malignant neoplasm of breast: Secondary | ICD-10-CM | POA: Diagnosis not present

## 2020-11-08 DIAGNOSIS — D0512 Intraductal carcinoma in situ of left breast: Secondary | ICD-10-CM

## 2020-11-09 ENCOUNTER — Encounter: Payer: Self-pay | Admitting: Genetic Counselor

## 2020-11-09 DIAGNOSIS — D0512 Intraductal carcinoma in situ of left breast: Secondary | ICD-10-CM

## 2020-11-09 DIAGNOSIS — Z803 Family history of malignant neoplasm of breast: Secondary | ICD-10-CM | POA: Insufficient documentation

## 2020-11-09 HISTORY — DX: Intraductal carcinoma in situ of left breast: D05.12

## 2020-11-09 HISTORY — DX: Family history of malignant neoplasm of breast: Z80.3

## 2020-11-09 NOTE — Progress Notes (Signed)
REFERRING PROVIDER: Rolm Bookbinder, MD [address] [place],  [place] [address]  PRIMARY PROVIDER:  Billie Ruddy, MD  PRIMARY REASON FOR VISIT:  1. Ductal carcinoma in situ of left breast   2. Family history of breast cancer    HISTORY OF PRESENT ILLNESS:   Tara Yates, a 55 y.o. female, was seen for a [facility] cancer genetics consultation at the request of Dr. Donne Hazel due to a personal and family history of cancer.  Tara Yates presents to clinic today to discuss the possibility of a hereditary predisposition to cancer, to discuss genetic testing, and to further clarify her future cancer risks, as well as potential cancer risks for family members.   In 2022, at the age of 42, Tara Yates was diagnosed with ductal carcinoma in situ of the left breast (ER+/PR+). The preliminary treatment plan includes lumpectomy, radiation, and anti-estrogens.   CANCER HISTORY:  Oncology History   No history exists.   RISK FACTORS:  Menarche was at age 52.  First live birth at age 64.  OCP use for more than 30 years.  Ovaries intact: yes.  Hysterectomy: no.  Menopausal status: postmenopausal.  HRT use: 0 years. Colonoscopy: yes; most recent in Jan 2021; ~5 tubular adenomas; plans to F/U in 3 year interval. Mammogram within the last year: yes. Up to date with pelvic exams: yes. Any excessive radiation exposure in the past: no  Past Medical History:  Diagnosis Date  . Allergy   . Anxiety   . Back pain   . Constipation   . Depression   . Ductal carcinoma in situ of left breast 11/09/2020  . Family history of breast cancer 11/09/2020  . Hyperlipidemia   . Hypothyroidism   . Joint pain   . Plantar fasciitis, bilateral   . SOBOE (shortness of breath on exertion)     Past Surgical History:  Procedure Laterality Date  . BREAST BIOPSY Right 02/28/2015  . BREAST EXCISIONAL BIOPSY Right 03/29/2015   had seed placement as wel  . ENDOMETRIAL ABLATION    . MOUTH SURGERY    .  RADIOACTIVE SEED GUIDED EXCISIONAL BREAST BIOPSY Right 04/03/2015   Procedure: RADIOACTIVE SEED GUIDED EXCISIONAL BREAST BIOPSY;  Surgeon: Rolm Bookbinder, MD;  Location: [facility];  Service: General;  Laterality: Right;    Social History   Socioeconomic History  . Marital status: Divorced    Spouse name: Not on file  . Number of children: Not on file  . Years of education: Not on file  . Highest education level: Not on file  Occupational History  . Not on file  Tobacco Use  . Smoking status: Former Smoker    Packs/day: 1.00    Years: 29.00    Pack years: 29.00    Types: Cigarettes    Quit date: 2000    Years since quitting: 22.3  . Smokeless tobacco: Never Used  . Tobacco comment: Quit 20 years ago  Vaping Use  . Vaping Use: Never used  Substance and Sexual Activity  . Alcohol use: Yes    Comment: Wine daily  . Drug use: Never  . Sexual activity: Not on file  Other Topics Concern  . Not on file  Social History Narrative  . Not on file   Social Determinants of Health   Financial Resource Strain: Not on file  Food Insecurity: Not on file  Transportation Needs: Not on file  Physical Activity: Not on file  Stress: Not on  file  Social Connections: Not on file     FAMILY HISTORY:  We obtained a detailed, 4-generation family history.  Significant diagnoses are listed below: Family History  Problem Relation Age of Onset  . Breast cancer Paternal Grandmother        dx 31s, d. 71  . Breast cancer Other        PGM's sisters, x2, dx unknown age     Tara Yates is unaware of previous family history of genetic testing for hereditary cancer risks. Patient's maternal ancestors are of European descent, and paternal ancestors are of European descent. There is no reported Ashkenazi Jewish ancestry. There is no known consanguinity.  GENETIC COUNSELING ASSESSMENT: Tara Yates is a 55 y.o. female with a personal and family history of cancer which is somewhat  suggestive of a hereditary cancer syndrome and predisposition to cancer given the presence of multiple breast cancer diagnoses in her paternal family and her grandmother's age at diagnosis. We, therefore, discussed and recommended the following at today's visit.   DISCUSSION: We discussed that 5 - 10% of cancer is hereditary, with most cases of hereditary breast cancer associated with mutations in BRCA1/2.  There are other genes that can be associated with hereditary breast cancer syndromes.  These include but are not limited to CHEK2, PALB2, and ATM.  Type of cancer risk and level of risk are gene-specific. We discussed that testing is beneficial for several reasons including knowing how to follow individuals after completing their treatment, identifying whether potential treatment options would be beneficial, and understanding if other family members could be at risk for cancer and allowing them to undergo genetic testing.   We reviewed the characteristics, features and inheritance patterns of hereditary cancer syndromes. We also discussed genetic testing, including the appropriate family members to test, the process of testing, insurance coverage and turn-around-time for results. We discussed the implications of a negative, positive and/or variant of uncertain significant result. In order to get genetic test results in a timely manner so that Tara Yates can use these genetic test results for treatment/surgical decisions, we recommended Tara Yates pursue genetic testing for the [employer].  The BRCAplus panel offered by [employer] and includes sequencing and deletion/duplication analysis for the following 8 genes: ATM, BRCA1, BRCA2, CDH1, CHEK2, PALB2, PTEN, and TP53.  Once complete, we recommend Tara Yates pursue reflex genetic testing to a more comprehensive gene panel.   Tara Yates  was offered a common hereditary cancer panel (47 genes) and an expanded pan-cancer panel (77 genes). Ms.  Yates was informed of the benefits and limitations of each panel, including that expanded pan-cancer panels contain genes that do not have clear management guidelines at this point in time.  We also discussed that as the number of genes included on a panel increases, the chances of variants of uncertain significance increases.  After considering the benefits and limitations of each gene panel, Tara Yates  elected to have a common hereditary cancers panel through [facility].  The CustomNext-Cancer+RNAinsight panel offered by [facility] includes sequencing and rearrangement analysis for the following 47 genes:  APC, ATM, AXIN2, BARD1, BMPR1A, BRCA1, BRCA2, BRIP1, CDH1, CDK4, CDKN2A, CHEK2, DICER1, EPCAM, GREM1, HOXB13, MEN1, MLH1, MSH2, MSH3, MSH6, MUTYH, NBN, NF1, NF2, NTHL1, PALB2, PMS2, POLD1, POLE, PTEN, RAD51C, RAD51D, RECQL, RET, SDHA, SDHAF2, SDHB, SDHC, SDHD, SMAD4, SMARCA4, STK11, TP53, TSC1, TSC2, and VHL.  RNA data is routinely analyzed for use in variant interpretation for all genes.  Based on Ms.  Yates's personal and family history of breast cancer, she meets medical criteria for genetic testing. Despite that she meets criteria, she may still have an out of pocket cost. We discussed that if her out of pocket cost for testing is over $100, the laboratory should contact her to discuss self-pay prices, patient pay assistance programs, if applicable, and other billing options.   PLAN: After considering the risks, benefits, and limitations, Tara Yates provided informed consent to pursue genetic testing and the blood sample was sent to [facility] for analysis of the [facility] + CustomNext-Cancer +RNA Panel. Results should be available within approximately 1-2 weeks' time, at which point they will be disclosed by telephone to Tara Yates, as will any additional recommendations warranted by these results. Tara Yates will receive a summary of her genetic counseling visit and a copy of her  results once available. This information will also be available in Epic.   Lastly, we encouraged Tara Yates to remain in contact with cancer genetics annually so that we can continuously update the family history and inform her of any changes in cancer genetics and testing that may be of benefit for this family.   Tara Yates questions were answered to her satisfaction today. Our contact information was provided should additional questions or concerns arise. Thank you for the referral and allowing [place] to share in the care of your patient.   Tara Yates Mays M. Joette Catching, [place], [facility] Genetic Counselor [email or web address] (P) [phone]  The patient was seen for a total of 30 minutes in face-to-face genetic counseling.  Drs. Magrinat, Lindi Adie and/or Burr Medico were available to discuss this case as needed.  _______________________________________________________________________ For Office Staff:  Number of people involved in session: 1 Was an Intern/ student involved with case: no

## 2020-11-16 ENCOUNTER — Encounter: Payer: Self-pay | Admitting: Genetic Counselor

## 2020-11-16 ENCOUNTER — Telehealth: Payer: Self-pay | Admitting: Genetic Counselor

## 2020-11-16 DIAGNOSIS — Z1379 Encounter for other screening for genetic and chromosomal anomalies: Secondary | ICD-10-CM | POA: Insufficient documentation

## 2020-11-16 NOTE — Telephone Encounter (Addendum)
Revealed negative genetic testing.  Discussed that we do not know why she has breast cancer or why there is cancer in the family. It could be sporadic, due to a different gene that we are not testing, or maybe our current technology may not be able to pick something up.  It will be important for her to keep in contact with genetics to keep up with whether additional testing may be needed.  Results of pan-cancer panel are pending.    

## 2020-11-28 ENCOUNTER — Telehealth: Payer: Self-pay | Admitting: Genetic Counselor

## 2020-11-28 ENCOUNTER — Ambulatory Visit: Payer: Self-pay | Admitting: Genetic Counselor

## 2020-11-28 DIAGNOSIS — Z803 Family history of malignant neoplasm of breast: Secondary | ICD-10-CM

## 2020-11-28 DIAGNOSIS — D0512 Intraductal carcinoma in situ of left breast: Secondary | ICD-10-CM

## 2020-11-28 DIAGNOSIS — Z1379 Encounter for other screening for genetic and chromosomal anomalies: Secondary | ICD-10-CM

## 2020-11-28 NOTE — Telephone Encounter (Signed)
Revealed negative genetic testing of [facility] CustomNext-Cancer +RNA Panel and variant of uncertain significance in CDKN2A.  Discussed that we do not know why she has breast cancer or why there is cancer in the family. It could be sporadic/familial, due to a different gene that we are not testing, or maybe our current technology may not be able to pick something up.  It will be important for her to keep in contact with genetics to keep up with whether additional testing may be needed.

## 2020-11-28 NOTE — Progress Notes (Signed)
HPI:  Tara Yates was previously seen in the [facility] clinic due to a personal and family history of cancer and concerns regarding a hereditary predisposition to cancer. Please refer to our prior cancer genetics clinic note for more information regarding our discussion, assessment and recommendations, at the time. Tara Yates recent genetic test results were disclosed to her, as were recommendations warranted by these results. These results and recommendations are discussed in more detail below.  CANCER HISTORY:  Oncology History  Ductal carcinoma in situ of left breast  11/09/2020 Initial Diagnosis   Ductal carcinoma in situ of left breast   11/16/2020 Genetic Testing   Negative hereditary cancer genetic testing: no pathogenic variants detected in [facility] BRCAPlus Panel.  The report date is Nov 16, 2020. The BRCAplus panel offered by [employer] and includes sequencing and deletion/duplication analysis for the following 8 genes: ATM, BRCA1, BRCA2, CDH1, CHEK2, PALB2, PTEN, and TP53.    Negative hereditary cancer genetic testing: no pathogenic variants detected in [facility] CustomNext-Cancer +RNAinsight Panel.  Variant of uncertain significance detected in CDKN2A at c.440C>T (p.A147V). The report date is Nov 26, 2020.   The CustomNext-Cancer+RNAinsight panel offered by [facility] includes sequencing and rearrangement analysis for the following 47 genes:  APC, ATM, AXIN2, BARD1, BMPR1A, BRCA1, BRCA2, BRIP1, CDH1, CDK4, CDKN2A, CHEK2, DICER1, EPCAM, GREM1, HOXB13, MEN1, MLH1, MSH2, MSH3, MSH6, MUTYH, NBN, NF1, NF2, NTHL1, PALB2, PMS2, POLD1, POLE, PTEN, RAD51C, RAD51D, RECQL, RET, SDHA, SDHAF2, SDHB, SDHC, SDHD, SMAD4, SMARCA4, STK11, TP53, TSC1, TSC2, and VHL.  RNA data is routinely analyzed for use in variant interpretation for all genes.      FAMILY HISTORY:  We obtained a detailed, 4-generation family history.  Significant diagnoses are listed below: Family History  Problem  Relation Age of Onset  . Breast cancer Paternal Grandmother        dx 65s, d. 65  . Breast cancer Other        PGM's sisters, x2, dx unknown age     Tara Yates is unaware of previous family history of genetic testing for hereditary cancer risks. Patient's maternal ancestors are of European descent, and paternal ancestors are of European descent. There is no reported Ashkenazi Jewish ancestry. There is no known consanguinity.   GENETIC TEST RESULTS: Genetic testing reported out on Nov 26, 2020.  The CustomNext-Cancer Panel +RNAinsight found no pathogenic mutations. The CustomNext-Cancer+RNAinsight panel offered by [facility] includes sequencing and rearrangement analysis for the following 47 genes:  APC, ATM, AXIN2, BARD1, BMPR1A, BRCA1, BRCA2, BRIP1, CDH1, CDK4, CDKN2A, CHEK2, DICER1, EPCAM, GREM1, HOXB13, MEN1, MLH1, MSH2, MSH3, MSH6, MUTYH, NBN, NF1, NF2, NTHL1, PALB2, PMS2, POLD1, POLE, PTEN, RAD51C, RAD51D, RECQL, RET, SDHA, SDHAF2, SDHB, SDHC, SDHD, SMAD4, SMARCA4, STK11, TP53, TSC1, TSC2, and VHL.  RNA data is routinely analyzed for use in variant interpretation for all genes.  The test report has been scanned into EPIC and is located under the Molecular Pathology section of the Results Review tab.  A portion of the result report is included below for reference.     We discussed with Tara Yates that because current genetic testing is not perfect, it is possible there may be a gene mutation in one of these genes that current testing cannot detect, but that chance is small.  We also discussed, that there could be another gene that has not yet been discovered, or that we have not yet tested, that is responsible for the cancer diagnoses in the family. It is  also possible there is a hereditary cause for the cancer in the family that Tara Yates did not inherit and therefore was not identified in her testing.  Therefore, it is important to remain in touch with cancer genetics in the future  so that we can continue to offer Tara Yates the most up to date genetic testing.   Genetic testing did identify a variant of uncertain significance (VUS) in the CDKN2A gene called p.A147V.  At this time, it is unknown if this variant is associated with increased cancer risk or if this is a normal finding, but most variants such as this get reclassified to being inconsequential. It should not be used to make medical management decisions. With time, we suspect the lab will determine the significance of this variant, if any. If we do learn more about it, we will try to contact Tara Yates to discuss it further. However, it is important to stay in touch with [place] periodically and keep the address and phone number up to date.  ADDITIONAL GENETIC TESTING: We discussed with Tara Yates that there are other genes that are associated with increased cancer risk that can be analyzed. Should Tara Yates wish to pursue additional genetic testing, we are happy to discuss and coordinate this testing, at any time.      CANCER SCREENING RECOMMENDATIONS: Tara Yates test result is considered negative (normal).  This means that we have not identified a hereditary cause for her personal history of cancer at this time. Most cancers happen by chance and this negative test suggests that her cancer may fall into this category.    While reassuring, this does not definitively rule out a hereditary predisposition to cancer. It is still possible that there could be genetic mutations that are undetectable by current technology. There could be genetic mutations in genes that have not been tested or identified to increase cancer risk.  Therefore, it is recommended she continue to follow the cancer management and screening guidelines provided by her oncology and primary healthcare provider.   An individual's cancer risk and medical management are not determined by genetic test results alone. Overall cancer risk assessment incorporates  additional factors, including personal medical history, family history, and any available genetic information that may result in a personalized plan for cancer prevention and surveillance  RECOMMENDATIONS FOR FAMILY MEMBERS:  Individuals in this family might be at some increased risk of developing cancer, over the general population risk, simply due to the family history of cancer.  We recommended women in this family have a yearly mammogram beginning at age 6, or 46 years younger than the earliest onset of cancer, an annual clinical breast exam, and perform monthly breast self-exams. Women in this family should also have a gynecological exam as recommended by their primary provider. Family members should be referred for colonoscopy starting at age 76.   FOLLOW-UP: Lastly, we discussed with Tara Yates that cancer genetics is a rapidly advancing field and it is possible that new genetic tests will be appropriate for her and/or her family members in the future. We encouraged her to remain in contact with cancer genetics on an annual basis so we can update her personal and family histories and let her know of advances in cancer genetics that may benefit this family.   Our contact number was provided. Ms. Skousen questions were answered to her satisfaction, and she knows she is welcome to call us at anytime with additional questions or concerns.     Noe Pittsley  Aurora Mask, MS, [facility] Genetic Counselor [email or web address] (P) [phone]

## 2020-12-08 ENCOUNTER — Other Ambulatory Visit: Payer: Self-pay | Admitting: Family Medicine

## 2020-12-08 DIAGNOSIS — E039 Hypothyroidism, unspecified: Secondary | ICD-10-CM

## 2020-12-10 ENCOUNTER — Encounter (HOSPITAL_BASED_OUTPATIENT_CLINIC_OR_DEPARTMENT_OTHER): Payer: Self-pay | Admitting: General Surgery

## 2020-12-10 ENCOUNTER — Other Ambulatory Visit: Payer: Self-pay

## 2020-12-14 ENCOUNTER — Other Ambulatory Visit (HOSPITAL_COMMUNITY): Payer: 59

## 2020-12-17 ENCOUNTER — Ambulatory Visit
Admission: RE | Admit: 2020-12-17 | Discharge: 2020-12-17 | Disposition: A | Discharge status: Home / Self Care | Payer: 59 | Source: Ambulatory Visit | Attending: General Surgery | Admitting: General Surgery

## 2020-12-17 ENCOUNTER — Other Ambulatory Visit: Payer: Self-pay

## 2020-12-17 DIAGNOSIS — D0512 Intraductal carcinoma in situ of left breast: Secondary | ICD-10-CM

## 2020-12-17 IMAGING — MG MM PLC BREAST LOC DEV 1ST LESION INC MAMMO GUIDE*L*
6 series · 6 of 6 positions shown · non-contrast
Comparison: Previous exam(s).

CLINICAL DATA: Pre lumpectomy localization of recently diagnosed
upper outer left breast DCIS.

EXAM:
MAMMOGRAPHIC GUIDED RADIOACTIVE SEED LOCALIZATION OF THE LEFT BREAST

[L LM (1 of 2)]
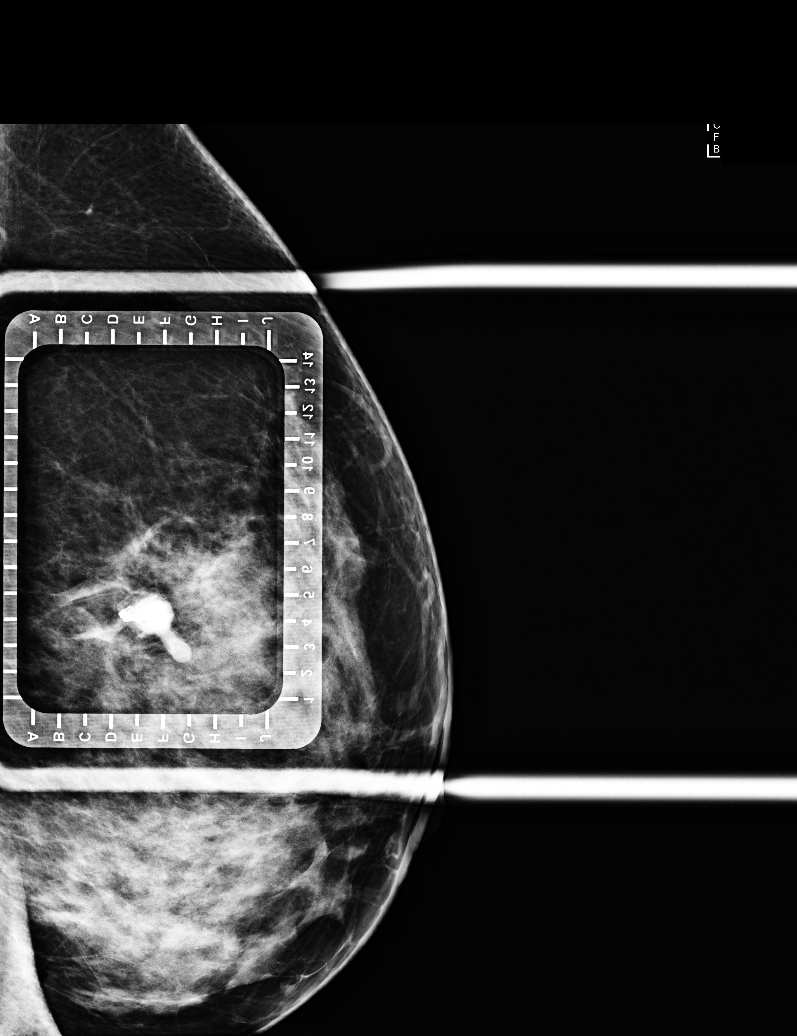

[L CC (1 of 3)]
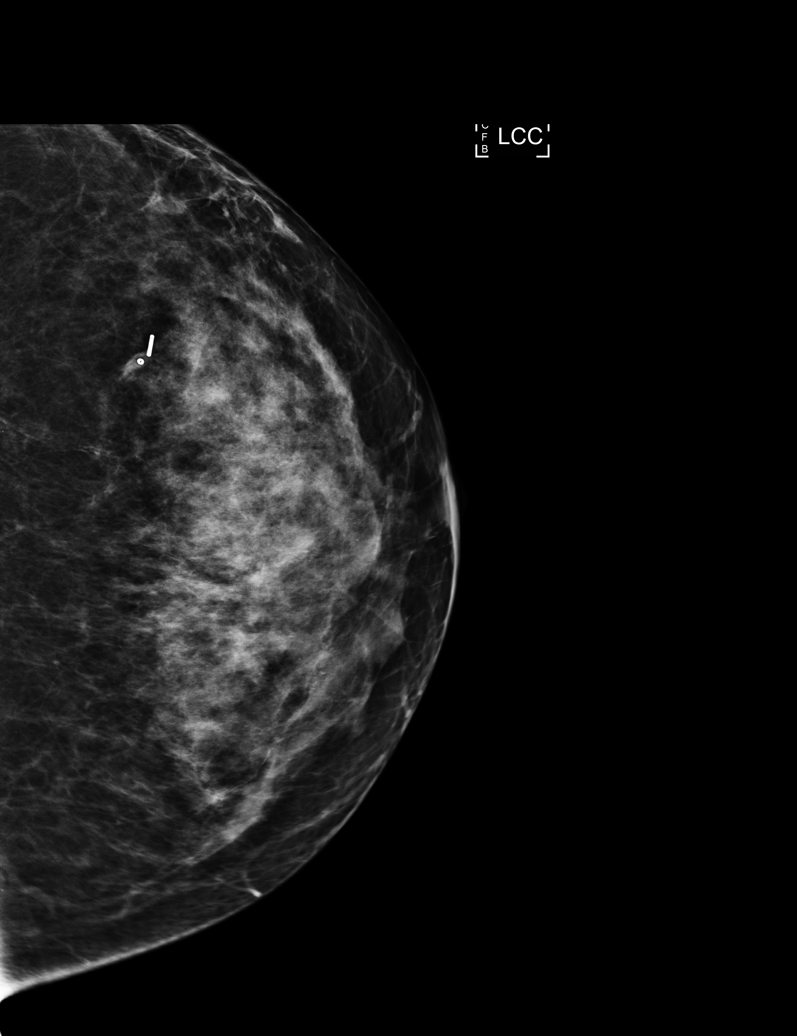

[L ML]
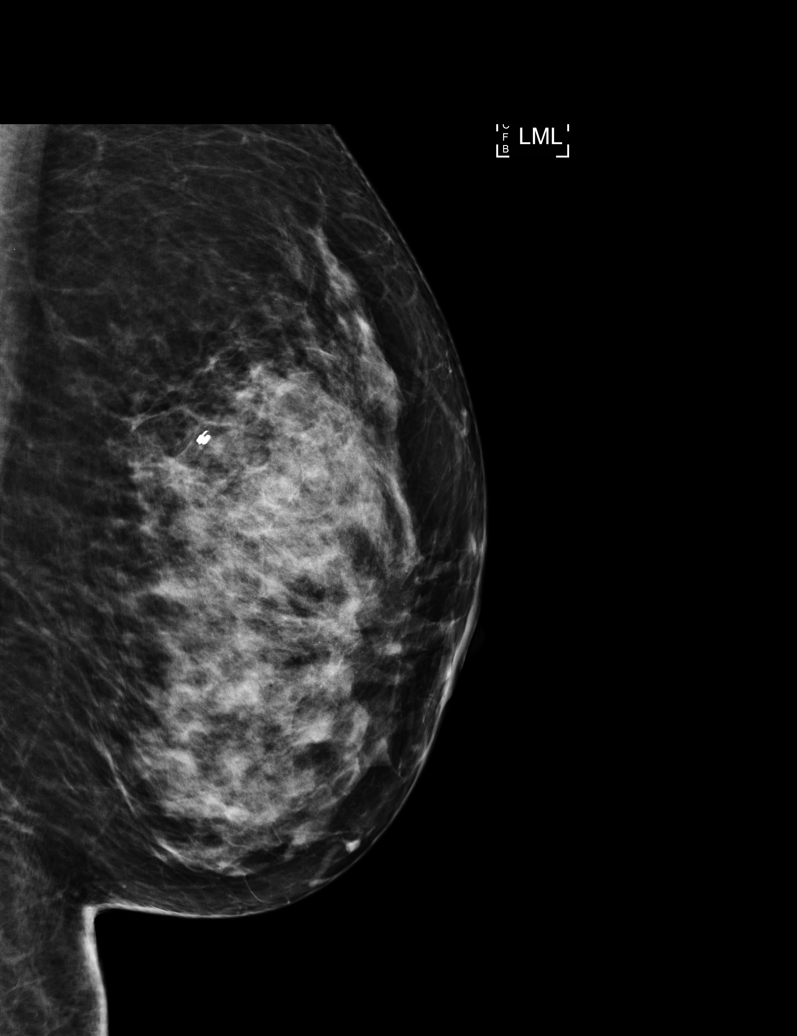

[L LM (2 of 2)]
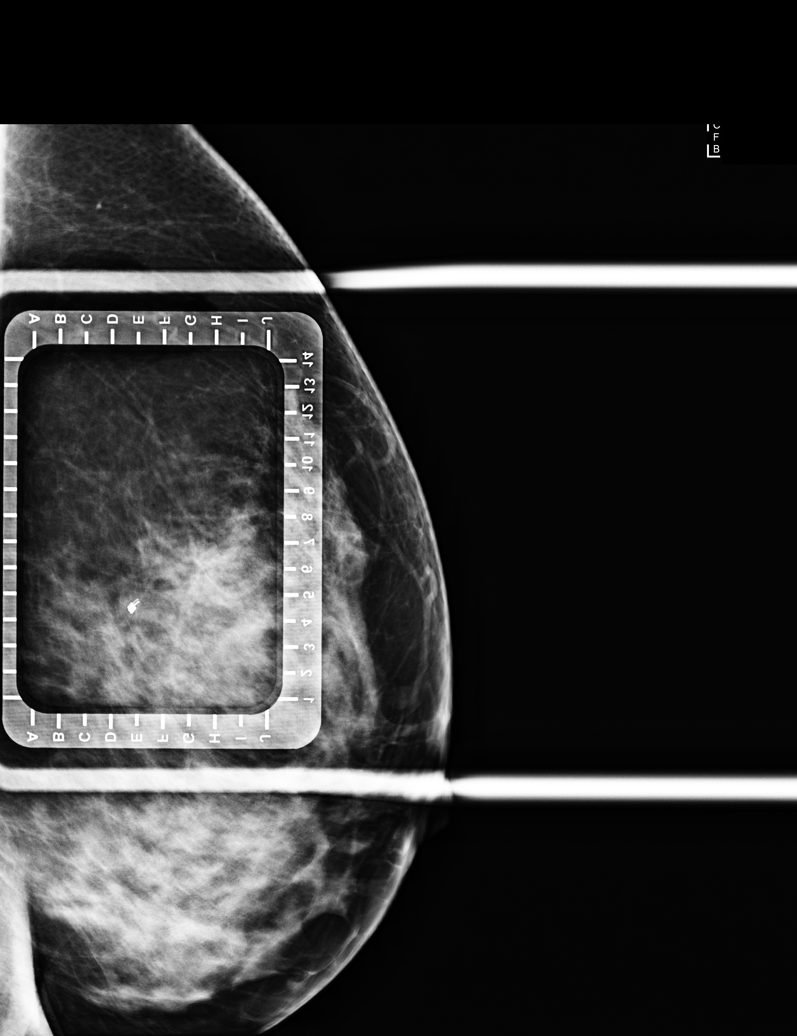

[L CC (2 of 3)]
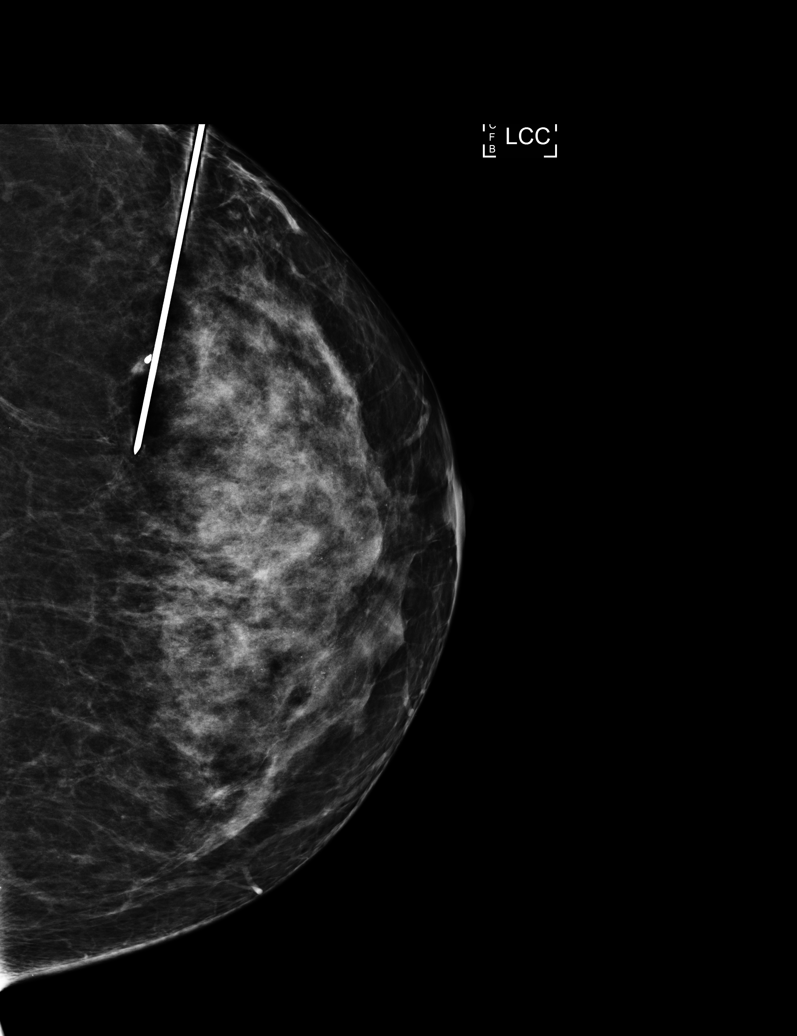

[L CC (3 of 3)]
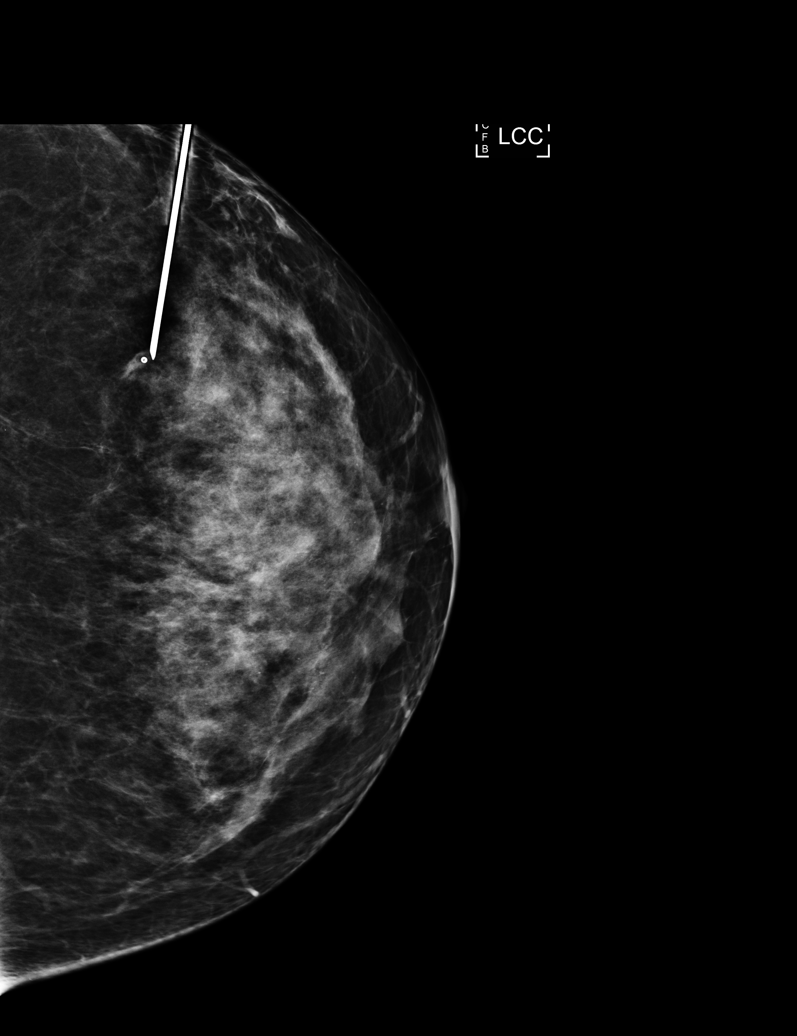

[6 of 6 positions shown; findings below may reference images not displayed]

FINDINGS: Patient presents for radioactive seed localization prior to left
lumpectomy. I met with the patient and we discussed the procedure of
seed localization including benefits and alternatives. We discussed
the high likelihood of a successful procedure. We discussed the
risks of the procedure including infection, bleeding, tissue injury
and further surgery. We discussed the low dose of radioactivity
involved in the procedure. Informed, written consent was given.

The usual time-out protocol was performed immediately prior to the
procedure.

Using mammographic guidance, sterile technique, 1% lidocaine and an
[DT] radioactive seed, the recently placed coil shaped biopsy
marker clip in the upper outer left breast was localized using a
lateral approach. The follow-up mammogram images confirm the seed in
the expected location and were marked for Dr. CAMMY.

Follow-up survey of the patient confirms presence of the radioactive
seed.

Order number of [DT] seed:  [PHONE_NUMBER].

Total activity:  0.252 mCi reference Date: [DATE]

The patient tolerated the procedure well and was released from the
[REDACTED]. She was given instructions regarding seed removal.
IMPRESSION: Radioactive seed localization left breast. No apparent
complications.

## 2020-12-17 NOTE — Progress Notes (Signed)

## 2020-12-18 ENCOUNTER — Ambulatory Visit (HOSPITAL_BASED_OUTPATIENT_CLINIC_OR_DEPARTMENT_OTHER): Payer: 59 | Admitting: Anesthesiology

## 2020-12-18 ENCOUNTER — Ambulatory Visit
Admission: RE | Admit: 2020-12-18 | Discharge: 2020-12-18 | Disposition: A | Discharge status: Home / Self Care | Payer: 59 | Source: Ambulatory Visit | Attending: General Surgery | Admitting: General Surgery

## 2020-12-18 ENCOUNTER — Other Ambulatory Visit: Payer: Self-pay

## 2020-12-18 ENCOUNTER — Encounter (HOSPITAL_BASED_OUTPATIENT_CLINIC_OR_DEPARTMENT_OTHER)
Admission: RE | Disposition: A | Discharge status: Home / Self Care | Payer: Self-pay | Source: Home / Self Care | Attending: General Surgery

## 2020-12-18 ENCOUNTER — Encounter (HOSPITAL_BASED_OUTPATIENT_CLINIC_OR_DEPARTMENT_OTHER): Payer: Self-pay | Admitting: General Surgery

## 2020-12-18 ENCOUNTER — Ambulatory Visit (HOSPITAL_BASED_OUTPATIENT_CLINIC_OR_DEPARTMENT_OTHER)
Admission: RE | Admit: 2020-12-18 | Discharge: 2020-12-18 | Disposition: A | Discharge status: Home / Self Care | Payer: 59 | Attending: General Surgery | Admitting: General Surgery

## 2020-12-18 DIAGNOSIS — N6022 Fibroadenosis of left breast: Secondary | ICD-10-CM | POA: Diagnosis not present

## 2020-12-18 DIAGNOSIS — D0512 Intraductal carcinoma in situ of left breast: Secondary | ICD-10-CM

## 2020-12-18 DIAGNOSIS — Z803 Family history of malignant neoplasm of breast: Secondary | ICD-10-CM | POA: Insufficient documentation

## 2020-12-18 DIAGNOSIS — Z87891 Personal history of nicotine dependence: Secondary | ICD-10-CM | POA: Diagnosis not present

## 2020-12-18 HISTORY — PX: BREAST LUMPECTOMY WITH RADIOACTIVE SEED LOCALIZATION: SHX6424

## 2020-12-18 IMAGING — MG MM BREAST SURGICAL SPECIMEN
1 series · 2 of 2 positions shown · non-contrast
Comparison: Previous exam(s).

CLINICAL DATA: Status post seed localized LEFT lumpectomy.

EXAM:
SPECIMEN RADIOGRAPH OF THE LEFT BREAST

[Series 2: L · left · 0.07mm/px · 2 of 2 slices shown]
[im 1/2]
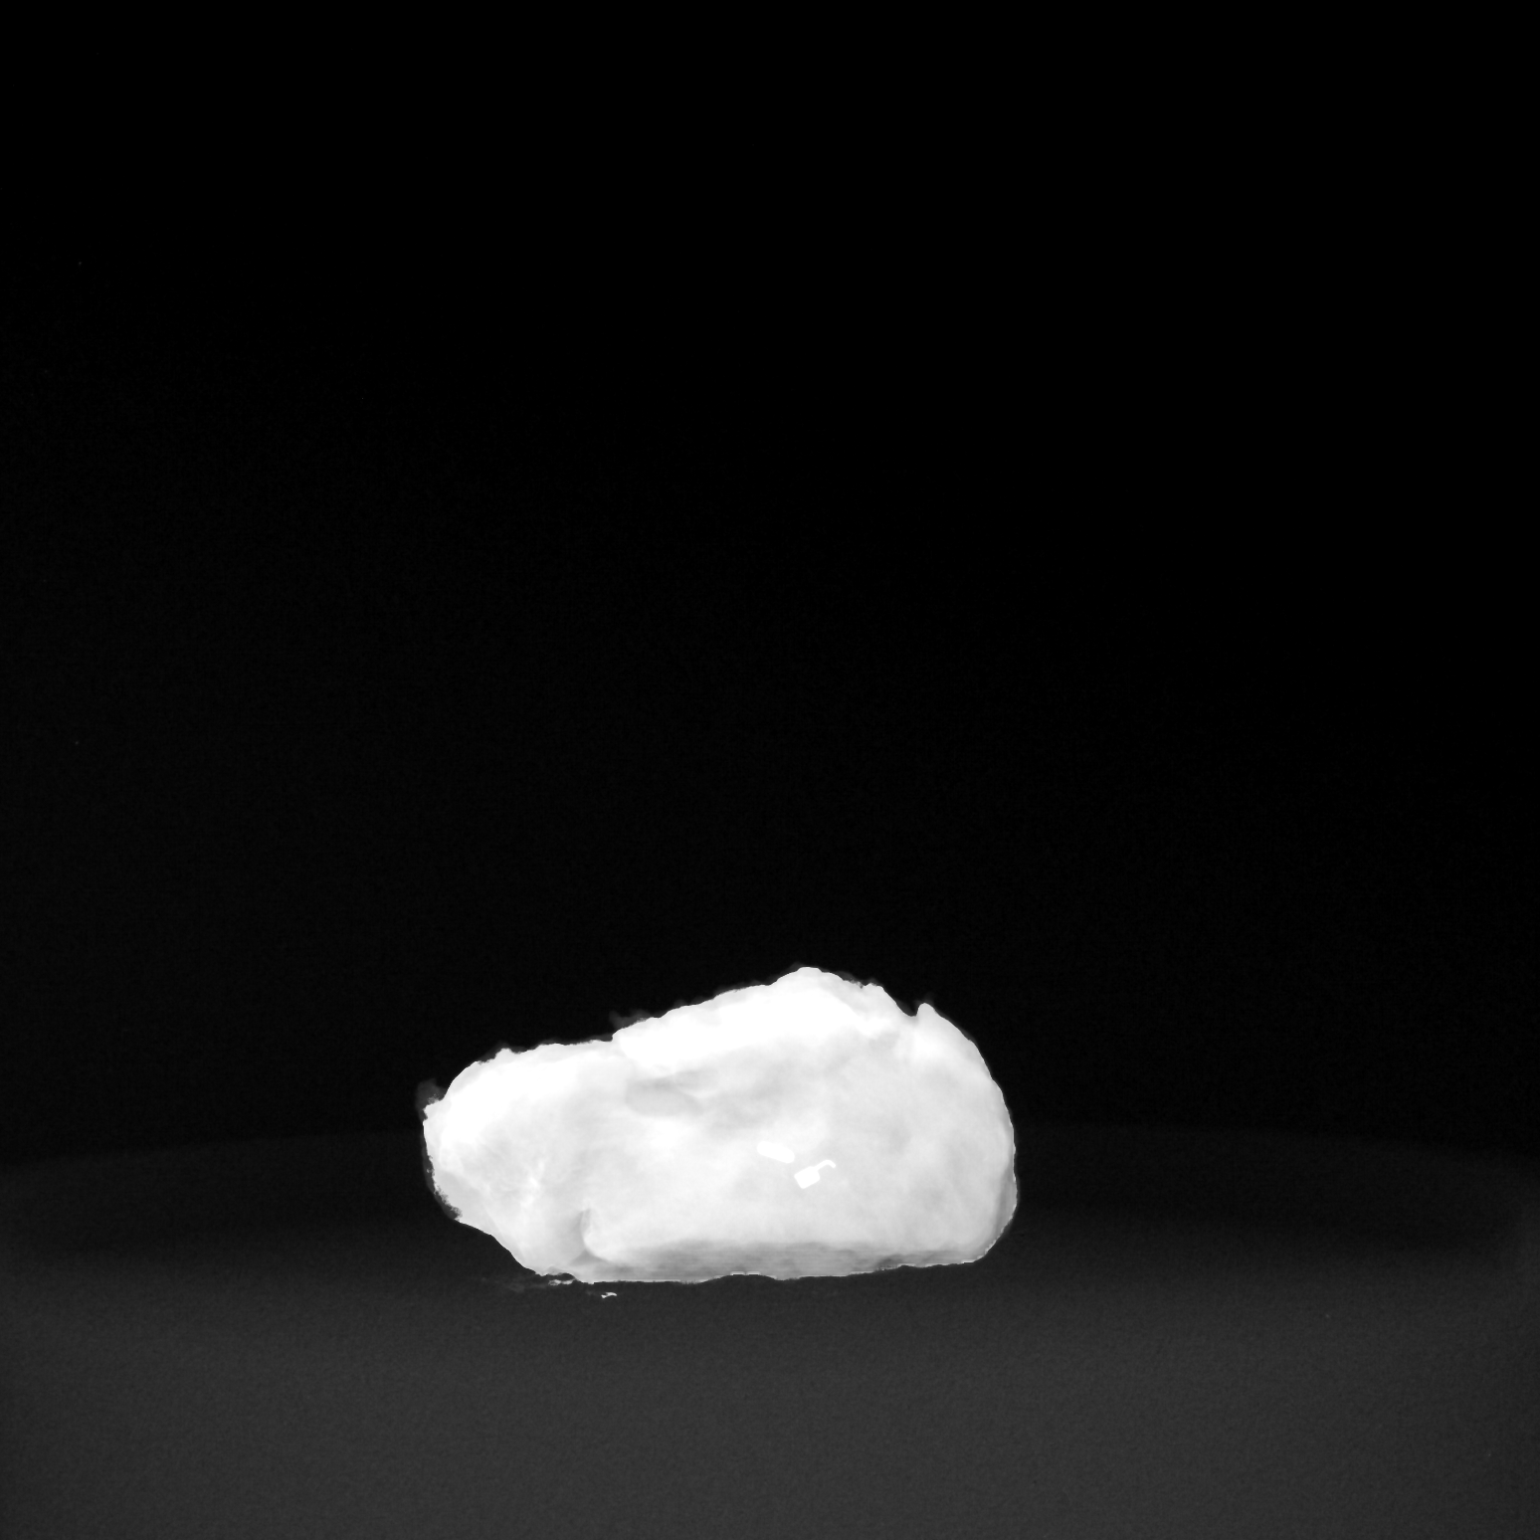
[im 2/2]
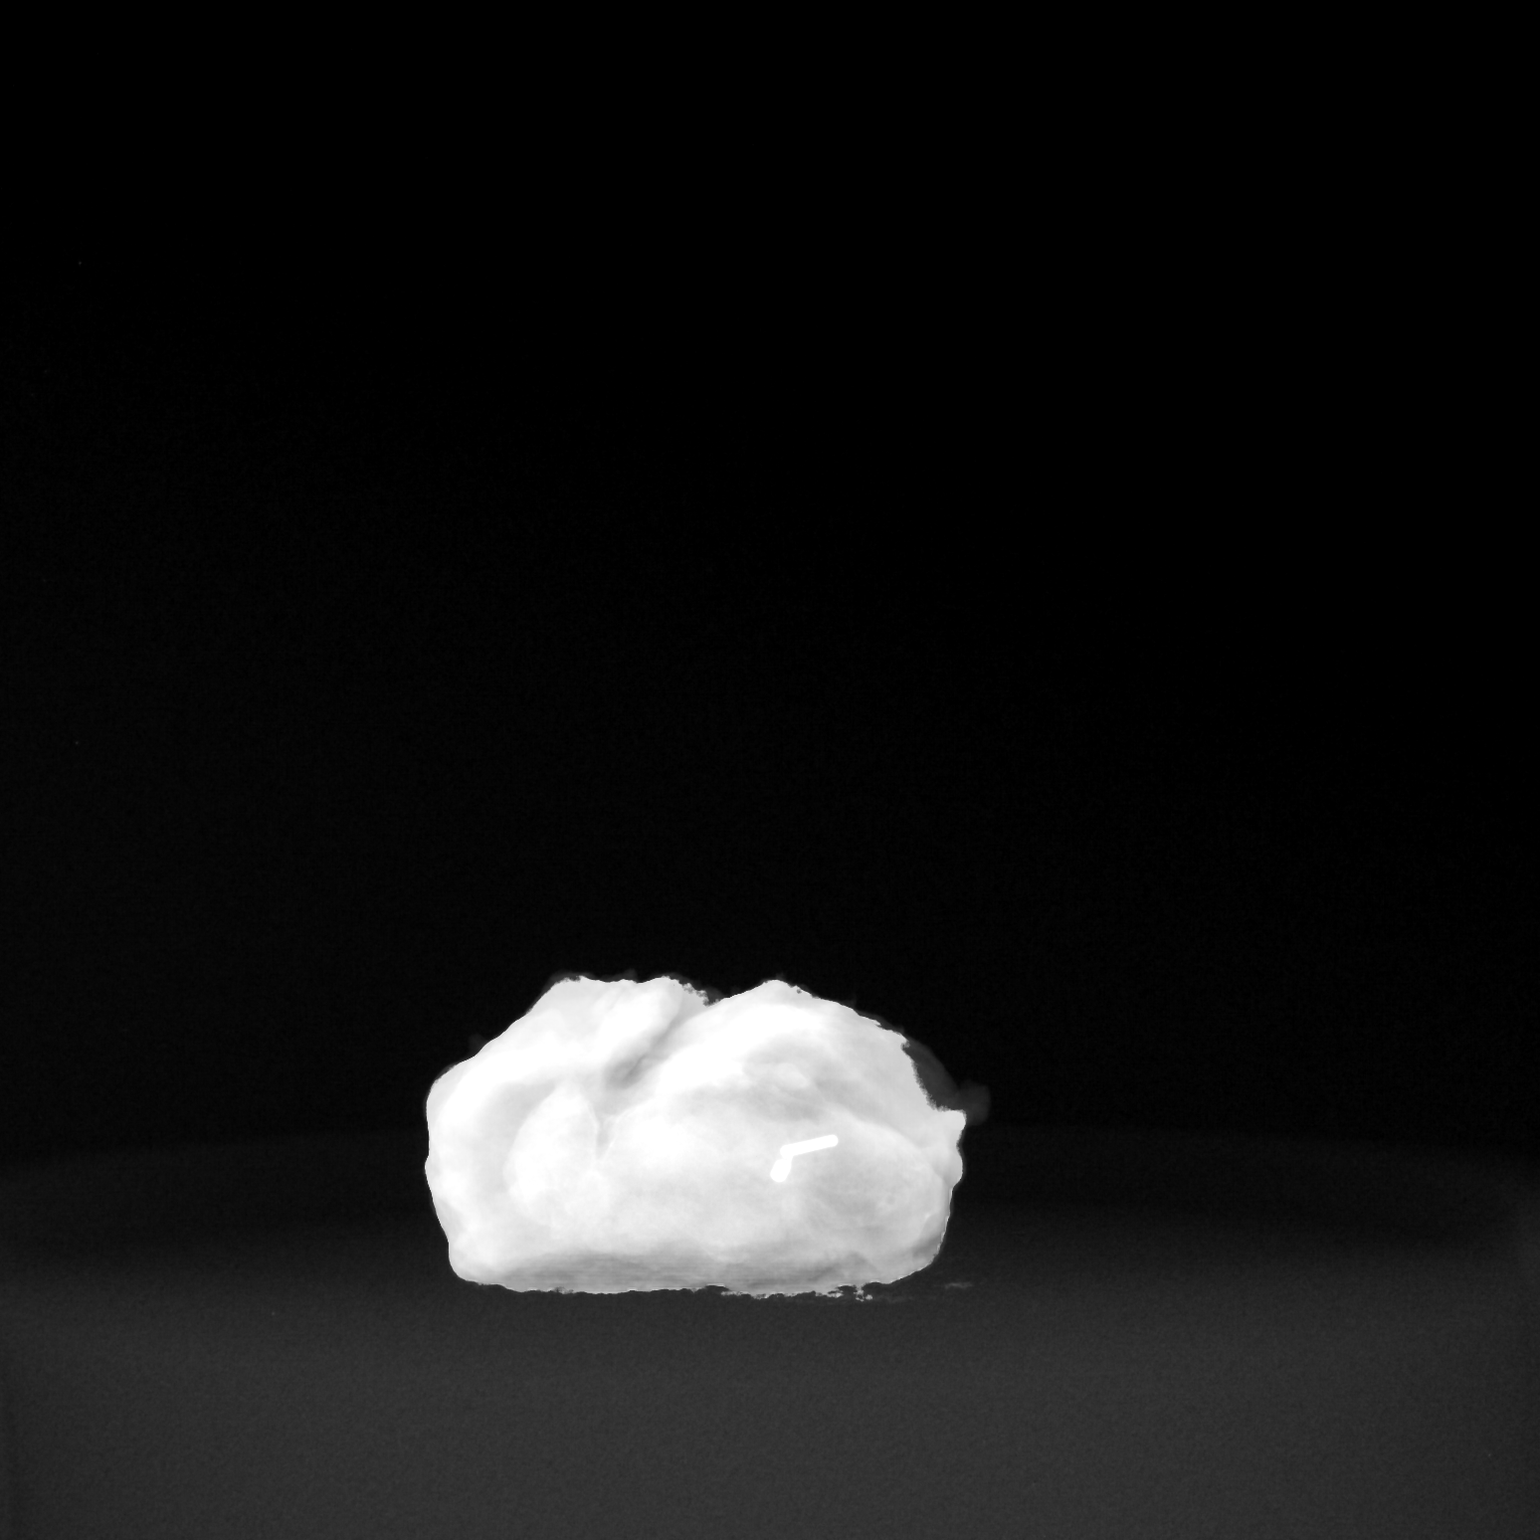

[2 of 2 positions shown; findings below may reference images not displayed]

FINDINGS: Status post excision of the left breast. The radioactive seed and
coil shaped biopsy marker clip are present and intact. The findings
are discussed with the operating room nurse at the time of
interpretation.
IMPRESSION: Specimen radiograph of the LEFT breast.

## 2020-12-18 SURGERY — BREAST LUMPECTOMY WITH RADIOACTIVE SEED LOCALIZATION
Anesthesia: General | Site: Breast | Laterality: Left

## 2020-12-18 MED ORDER — OXYCODONE HCL 5 MG PO TABS: 5.0000 mg | ORAL_TABLET | Freq: Once | ORAL | Status: DC | PRN

## 2020-12-18 MED ORDER — EPHEDRINE SULFATE 50 MG/ML IJ SOLN
INTRAMUSCULAR | Status: DC | PRN
  Administered 2020-12-18 (×3): 10 mg via INTRAVENOUS

## 2020-12-18 MED ORDER — FENTANYL CITRATE (PF) 100 MCG/2ML IJ SOLN
INTRAMUSCULAR | Status: DC | PRN
  Administered 2020-12-18: 50 ug via INTRAVENOUS
  Administered 2020-12-18 (×2): 25 ug via INTRAVENOUS

## 2020-12-18 MED ORDER — MIDAZOLAM HCL 5 MG/5ML IJ SOLN
INTRAMUSCULAR | Status: DC | PRN
  Administered 2020-12-18: 2 mg via INTRAVENOUS

## 2020-12-18 MED ORDER — CEFAZOLIN SODIUM-DEXTROSE 2-4 GM/100ML-% IV SOLN
2.0000 g | INTRAVENOUS | Status: AC
  Administered 2020-12-18: 2 g via INTRAVENOUS

## 2020-12-18 MED ORDER — EPHEDRINE 5 MG/ML INJ
INTRAVENOUS | Status: AC
  Filled 2020-12-18: qty 10

## 2020-12-18 MED ORDER — DEXAMETHASONE SODIUM PHOSPHATE 10 MG/ML IJ SOLN
INTRAMUSCULAR | Status: DC | PRN
  Administered 2020-12-18: 5 mg via INTRAVENOUS

## 2020-12-18 MED ORDER — KETOROLAC TROMETHAMINE 15 MG/ML IJ SOLN
15.0000 mg | INTRAMUSCULAR | Status: AC
  Administered 2020-12-18: 15 mg via INTRAVENOUS

## 2020-12-18 MED ORDER — MIDAZOLAM HCL 2 MG/2ML IJ SOLN
INTRAMUSCULAR | Status: AC
  Filled 2020-12-18: qty 2

## 2020-12-18 MED ORDER — PROPOFOL 10 MG/ML IV BOLUS
INTRAVENOUS | Status: DC | PRN
  Administered 2020-12-18: 200 mg via INTRAVENOUS

## 2020-12-18 MED ORDER — OXYCODONE HCL 5 MG/5ML PO SOLN: 5.0000 mg | Freq: Once | ORAL | Status: DC | PRN

## 2020-12-18 MED ORDER — AMISULPRIDE (ANTIEMETIC) 5 MG/2ML IV SOLN: 10.0000 mg | Freq: Once | INTRAVENOUS | Status: DC | PRN

## 2020-12-18 MED ORDER — HYDROMORPHONE HCL 1 MG/ML IJ SOLN: 0.2500 mg | INTRAMUSCULAR | Status: DC | PRN

## 2020-12-18 MED ORDER — LIDOCAINE HCL (CARDIAC) PF 100 MG/5ML IV SOSY
PREFILLED_SYRINGE | INTRAVENOUS | Status: DC | PRN
  Administered 2020-12-18: 60 mg via INTRAVENOUS

## 2020-12-18 MED ORDER — TRAMADOL HCL 50 MG PO TABS: 100.0000 mg | ORAL_TABLET | Freq: Four times a day (QID) | ORAL | 0 refills | Status: DC | PRN

## 2020-12-18 MED ORDER — PHENYLEPHRINE HCL (PRESSORS) 10 MG/ML IV SOLN
INTRAVENOUS | Status: DC | PRN
  Administered 2020-12-18: 40 ug via INTRAVENOUS

## 2020-12-18 MED ORDER — LACTATED RINGERS IV SOLN: INTRAVENOUS | Status: DC

## 2020-12-18 MED ORDER — ENSURE PRE-SURGERY PO LIQD: 296.0000 mL | Freq: Once | ORAL | Status: DC

## 2020-12-18 MED ORDER — ONDANSETRON HCL 4 MG/2ML IJ SOLN
INTRAMUSCULAR | Status: DC | PRN
  Administered 2020-12-18: 4 mg via INTRAVENOUS

## 2020-12-18 MED ORDER — MEPERIDINE HCL 25 MG/ML IJ SOLN: 6.2500 mg | INTRAMUSCULAR | Status: DC | PRN

## 2020-12-18 MED ORDER — FENTANYL CITRATE (PF) 100 MCG/2ML IJ SOLN
INTRAMUSCULAR | Status: AC
  Filled 2020-12-18: qty 2

## 2020-12-18 MED ORDER — ACETAMINOPHEN 500 MG PO TABS
1000.0000 mg | ORAL_TABLET | ORAL | Status: AC
  Administered 2020-12-18: 1000 mg via ORAL

## 2020-12-18 MED ORDER — PHENYLEPHRINE 40 MCG/ML (10ML) SYRINGE FOR IV PUSH (FOR BLOOD PRESSURE SUPPORT)
PREFILLED_SYRINGE | INTRAVENOUS | Status: AC
  Filled 2020-12-18: qty 10

## 2020-12-18 MED ORDER — ACETAMINOPHEN 500 MG PO TABS
ORAL_TABLET | ORAL | Status: AC
  Filled 2020-12-18: qty 2

## 2020-12-18 MED ORDER — PROMETHAZINE HCL 25 MG/ML IJ SOLN: 6.2500 mg | INTRAMUSCULAR | Status: DC | PRN

## 2020-12-18 MED ORDER — KETOROLAC TROMETHAMINE 15 MG/ML IJ SOLN
INTRAMUSCULAR | Status: AC
  Filled 2020-12-18: qty 1

## 2020-12-18 MED ORDER — CEFAZOLIN SODIUM-DEXTROSE 2-4 GM/100ML-% IV SOLN
INTRAVENOUS | Status: AC
  Filled 2020-12-18: qty 100

## 2020-12-18 MED ORDER — BUPIVACAINE HCL (PF) 0.25 % IJ SOLN
INTRAMUSCULAR | Status: DC | PRN
  Administered 2020-12-18: 10 mL

## 2020-12-18 SURGICAL SUPPLY — 48 items
ADH SKN CLS APL DERMABOND .7 (GAUZE/BANDAGES/DRESSINGS) ×1
APL PRP STRL LF DISP 70% ISPRP (MISCELLANEOUS) ×1
BINDER BREAST LRG (GAUZE/BANDAGES/DRESSINGS) IMPLANT
BINDER BREAST XLRG (GAUZE/BANDAGES/DRESSINGS) ×1 IMPLANT
BLADE SURG 15 STRL LF DISP TIS (BLADE) ×1 IMPLANT
BLADE SURG 15 STRL SS (BLADE) ×2
CHLORAPREP W/TINT 26 (MISCELLANEOUS) ×2 IMPLANT
CLIP VESOCCLUDE SM WIDE 6/CT (CLIP) IMPLANT
COVER BACK TABLE 60X90IN (DRAPES) ×2 IMPLANT
COVER MAYO STAND STRL (DRAPES) ×2 IMPLANT
COVER PROBE W GEL 5X96 (DRAPES) ×2 IMPLANT
DERMABOND ADVANCED (GAUZE/BANDAGES/DRESSINGS) ×1
DERMABOND ADVANCED .7 DNX12 (GAUZE/BANDAGES/DRESSINGS) ×1 IMPLANT
DRAPE LAPAROSCOPIC ABDOMINAL (DRAPES) ×2 IMPLANT
DRAPE UTILITY XL STRL (DRAPES) ×2 IMPLANT
ELECT COATED BLADE 2.86 ST (ELECTRODE) ×2 IMPLANT
ELECT REM PT RETURN 9FT ADLT (ELECTROSURGICAL) ×2
ELECTRODE REM PT RTRN 9FT ADLT (ELECTROSURGICAL) ×1 IMPLANT
GLOVE SURG ENC MOIS LTX SZ6.5 (GLOVE) ×1 IMPLANT
GLOVE SURG ENC MOIS LTX SZ7 (GLOVE) ×3 IMPLANT
GLOVE SURG UNDER POLY LF SZ7 (GLOVE) ×1 IMPLANT
GLOVE SURG UNDER POLY LF SZ7.5 (GLOVE) ×2 IMPLANT
GOWN STRL REUS W/ TWL LRG LVL3 (GOWN DISPOSABLE) ×2 IMPLANT
GOWN STRL REUS W/TWL LRG LVL3 (GOWN DISPOSABLE) ×4
HEMOSTAT ARISTA ABSORB 3G PWDR (HEMOSTASIS) IMPLANT
KIT MARKER MARGIN INK (KITS) ×2 IMPLANT
NDL HYPO 25X1 1.5 SAFETY (NEEDLE) ×1 IMPLANT
NEEDLE HYPO 25X1 1.5 SAFETY (NEEDLE) ×2 IMPLANT
NS IRRIG 1000ML POUR BTL (IV SOLUTION) IMPLANT
PACK BASIN DAY SURGERY FS (CUSTOM PROCEDURE TRAY) ×2 IMPLANT
PENCIL SMOKE EVACUATOR (MISCELLANEOUS) ×2 IMPLANT
RETRACTOR ONETRAX LX 90X20 (MISCELLANEOUS) ×1 IMPLANT
SLEEVE SCD COMPRESS KNEE MED (STOCKING) ×2 IMPLANT
SPONGE LAP 4X18 RFD (DISPOSABLE) ×2 IMPLANT
STRIP CLOSURE SKIN 1/2X4 (GAUZE/BANDAGES/DRESSINGS) ×2 IMPLANT
SUT MNCRL AB 4-0 PS2 18 (SUTURE) ×2 IMPLANT
SUT MON AB 5-0 PS2 18 (SUTURE) IMPLANT
SUT SILK 2 0 SH (SUTURE) IMPLANT
SUT VIC AB 2-0 SH 27 (SUTURE) ×2
SUT VIC AB 2-0 SH 27XBRD (SUTURE) ×1 IMPLANT
SUT VIC AB 3-0 SH 27 (SUTURE) ×2
SUT VIC AB 3-0 SH 27X BRD (SUTURE) ×1 IMPLANT
SUT VIC AB 5-0 PS2 18 (SUTURE) IMPLANT
SYR CONTROL 10ML LL (SYRINGE) ×2 IMPLANT
TOWEL GREEN STERILE FF (TOWEL DISPOSABLE) ×2 IMPLANT
TRAY FAXITRON CT DISP (TRAY / TRAY PROCEDURE) ×2 IMPLANT
TUBE CONNECTING 20X1/4 (TUBING) IMPLANT
YANKAUER SUCT BULB TIP NO VENT (SUCTIONS) IMPLANT

## 2020-12-18 NOTE — Op Note (Signed)
Preoperative diagnosis: Left breast ductal carcinoma in situ Postoperative diagnosis: Same as above Procedure: Left breast radioactive seed guided lumpectomy Surgeon: Dr. Serita Grammes Anesthesia: General Specimens: 1.  Left breast tissue marked with paint 2.  Additional lateral and posterior margins marked short superior, long lateral, double deep Estimated blood loss: Minimal Complications: None Drains: None Sponge and count was correct at completion Decision to recovery in stable condition  Indications:54 yof I know from prior excision of right breast csl presents with left breast dcis. she has fh of breast cancer in pgm at age 42 deceased as well as two paternal great aunts.  she has no dc or mass. she has mm that shows c density breasts.  there is a left breast distortion on mammography.  nl ax nodes on [place].  biopsy is int grade dcis that is 95% er pos, 20% pr pos. we discussed a lumpectomy.  Procedure: After informed consent was obtained the patient was given antibiotics and SCDs were in place.  She had a radioactive seed placed prior to beginning.  I had these mammograms in the operating room.  She was placed under general anesthesia without complication.  She was prepped and draped in the standard sterile surgical fashion.  Surgical timeout was then performed.  I made a periareolar incision after infiltrating Marcaine.  I did this in order to hide the scar later.  I then tunneled to the lesion.  I used a lighted retractor to do this.  I then identified the seed and remove the seed and the surrounding tissue in that attempt to get a clear margin around the lesion.  I then passed this off the table after marking it with paint Mammogram confirmed removal of the seed and the clip.  I thought on my 3D images I might be close to 2 margin so I remove these as above.  Hemostasis was then obtained.  I then closed this with 2-0 Vicryl.  The skin was closed with 3-0 Vicryl and 5-0 Monocryl.  Glue and  Steri-Strips were applied.  She tolerated this well was extubated and transferred recovery stable condition.

## 2020-12-18 NOTE — Transfer of Care (Signed)
Immediate Anesthesia Transfer of Care Note  Patient: Tara Yates  Procedure(s) Performed: LEFT BREAST LUMPECTOMY WITH RADIOACTIVE SEED LOCALIZATION (Left: Breast)  Patient Location: PACU  Anesthesia Type:General  Level of Consciousness: awake, alert  and oriented  Airway & Oxygen Therapy: Patient Spontanous Breathing and Patient connected to face mask oxygen  Post-op Assessment: Report given to RN and Post -op Vital signs reviewed and stable  Post vital signs: Reviewed and stable  Last Vitals:  Vitals Value Taken Time  BP 111/60 12/18/20 0830  Temp    Pulse 78 12/18/20 0834  Resp 18 12/18/20 0834  SpO2 98 % 12/18/20 0834  Vitals shown include unvalidated device data.  Last Pain:  Vitals:   12/18/20 0630  TempSrc: Oral  PainSc: 0-No pain         Complications: No notable events documented.

## 2020-12-18 NOTE — Anesthesia Postprocedure Evaluation (Signed)
Anesthesia Post Note  Patient: Tara Yates  Procedure(s) Performed: LEFT BREAST LUMPECTOMY WITH RADIOACTIVE SEED LOCALIZATION (Left: Breast)     Patient location during evaluation: PACU Anesthesia Type: General Level of consciousness: awake and alert Pain management: pain level controlled Vital Signs Assessment: post-procedure vital signs reviewed and stable Respiratory status: spontaneous breathing, nonlabored ventilation and respiratory function stable Cardiovascular status: blood pressure returned to baseline and stable Postop Assessment: no apparent nausea or vomiting Anesthetic complications: no   No notable events documented.  Last Vitals:  Vitals:   12/18/20 0900 12/18/20 0909  BP: 112/68 116/65  Pulse: 76 76  Resp: 10 16  Temp:  36.4 C  SpO2: 95% 96%    Last Pain:  Vitals:   12/18/20 0909  TempSrc:   PainSc: 0-No pain                 Lynda Rainwater

## 2020-12-18 NOTE — H&P (Signed)
55 yof I know from prior excision of right breast csl presents with left breast dcis. she has fh of breast cancer in pgm at age 55 deceased as well as two paternal great aunts.  she has no dc or mass. she has mm that shows c density breasts.  there is a left breast distortion on mammography.  nl ax nodes on [place].  biopsy is int grade dcis that is 95% er pos, 20% pr pos.  she is here with her sister to discuss options today   Past Surgical History  Breast Biopsy   Right. Colon Polyp Removal - Colonoscopy   Oral Surgery    Diagnostic Studies History  Colonoscopy   1-5 years ago never Mammogram   within last year  Allergies  No Known Drug Allergies    Allergies Reconciled    Medication History  Gabapentin  (300MG  Capsule, Oral) Active. ValACYclovir HCl  (1GM Tablet, Oral) Active. Levothyroxine Sodium  (50MCG Tablet, Oral) Active. Multiple Vitamin  (Oral) Active. Medications Reconciled   Social History  Alcohol use   Moderate alcohol use, Occasional alcohol use. Caffeine use   Coffee. No drug use    Family History  Depression   Sister. Heart disease in female family member before age 11   Hypertension   Sister. Thyroid problems   Father.  Pregnancy / Birth History  Age at menarche   40 years. Age of menopause   [age] Contraceptive History   Oral contraceptives. Gravida   5 Maternal age   57-25 Para   2  Other Problems  Hypercholesterolemia     Review of Systems General Not Present- Appetite Loss, Chills, Fatigue, Fever, Night Sweats, Weight Gain and Weight Loss. Skin Not Present- Change in Wart/Mole, Dryness, Hives, Jaundice, New Lesions, Non-Healing Wounds, Rash and Ulcer. Respiratory Not Present- Bloody sputum, Chronic Cough, Difficulty Breathing, Snoring and Wheezing. Breast Not Present- Breast Mass, Breast Pain, Nipple Discharge and Skin Changes. Cardiovascular Not Present- Chest Pain, Difficulty Breathing Lying Down, Leg Cramps, Palpitations, Rapid Heart Rate,  Shortness of Breath and Swelling of Extremities. Musculoskeletal Not Present- Back Pain, Joint Pain, Joint Stiffness, Muscle Pain, Muscle Weakness and Swelling of Extremities. Neurological Not Present- Decreased Memory, Fainting, Headaches, Numbness, Seizures, Tingling, Tremor, Trouble walking and Weakness.    Physical Exam  General Mental Status - Alert. Orientation - Oriented X3. Breast Nipples - No Discharge. Breast Lump - No Palpable Breast Mass. Lymphatic Head & Neck General Head & Neck Lymphatics: Bilateral - Description - Normal. Axillary General Axillary Region: Bilateral - Description - Normal. Note:  no Casey adenopathy Cv rrr  Pulm effort normal   Assessment & Plan  BREAST NEOPLASM, TIS (DCIS), LEFT (D05.12) Story: Left breast seed guided lumpectomy We discussed pathophysiology of dcis. we discussed genetic testing as possibility for her given family history and I will refer her to see genetic counselors at [facility]. I discussed possibility of COMET trial but she is not interested and I am not sure she is candidate just with distortion. she does not need nodal surgery. We discussed the options for treatment of the breast cancer which included lumpectomy versus a mastectomy. We discussed the performance of the lumpectomy with radioactive seed placement. We discussed a 5-10% chance of a positive margin requiring reexcision in the operating room. We also discussed that she will likely be recommended radiation therapy if she undergoes lumpectomy. We discussed mastectomy . he decision for lumpectomy vs mastectomy has no impact on decision for chemotherapy. Most mastectomy patients  will not need radiation therapy. We discussed that there is no difference long term whether she undergoes lumpectomy with radiation therapy or antiestrogen therapy versus a mastectomy. There is also no real difference between her recurrence in the breast. We discussed the risks of operation including  bleeding, infection, possible reoperation. She understands her further therapy will be based on what her stages at the time of her operation.

## 2020-12-18 NOTE — Discharge Instructions (Addendum)
[place] Office Phone Number [phone]  BREAST BIOPSY/ PARTIAL MASTECTOMY: POST OP INSTRUCTIONS Take 400 mg of ibuprofen every 8 hours or 650 mg tylenol every 6 hours for next 72 hours then as needed. Use ice several times daily also. Always review your discharge instruction sheet given to you by the facility where your surgery was performed.  IF YOU HAVE DISABILITY OR FAMILY LEAVE FORMS, YOU MUST BRING THEM TO THE OFFICE FOR PROCESSING.  DO NOT GIVE THEM TO YOUR DOCTOR.  A prescription for pain medication may be given to you upon discharge.  Take your pain medication as prescribed, if needed.  If narcotic pain medicine is not needed, then you may take acetaminophen (Tylenol), naprosyn (Alleve) or ibuprofen (Advil) as needed. No Tylenol or Ibuprofen until after 2:45pm today. Take your usually prescribed medications unless otherwise directed If you need a refill on your pain medication, please contact your pharmacy.  They will contact our office to request authorization.  Prescriptions will not be filled after 5pm or on week-ends. You should eat very light the first 24 hours after surgery, such as soup, crackers, pudding, etc.  Resume your normal diet the day after surgery. Most patients will experience some swelling and bruising in the breast.  Ice packs and a good support bra will help.  Wear the breast binder provided or a sports bra for 72 hours day and night.  After that wear a sports bra during the day until you return to the office. Swelling and bruising can take several days to resolve.  It is common to experience some constipation if taking pain medication after surgery.  Increasing fluid intake and taking a stool softener will usually help or prevent this problem from occurring.  A mild laxative (Milk of Magnesia or Miralax) should be taken according to package directions if there are no bowel movements after 48 hours. Unless discharge instructions indicate otherwise,  you may remove your bandages 48 hours after surgery and you may shower at that time.  You may have steri-strips (small skin tapes) in place directly over the incision.  These strips should be left on the skin for 7-10 days and will come off on their own.  If your surgeon used skin glue on the incision, you may shower in 24 hours.  The glue will flake off over the next 2-3 weeks.  Any sutures or staples will be removed at the office during your follow-up visit. ACTIVITIES:  You may resume regular daily activities (gradually increasing) beginning the next day.  Wearing a good support bra or sports bra minimizes pain and swelling.  You may have sexual intercourse when it is comfortable. You may drive when you no longer are taking prescription pain medication, you can comfortably wear a seatbelt, and you can safely maneuver your car and apply brakes. RETURN TO WORK:  ______________________________________________________________________________________ Dennis Bast should see your doctor in the office for a follow-up appointment approximately two weeks after your surgery.  Your doctor's nurse will typically make your follow-up appointment when she calls you with your pathology report.  Expect your pathology report 3-4 business days after your surgery.  You may call to check if you do not hear from [place] after three days. OTHER INSTRUCTIONS: _______________________________________________________________________________________________ _____________________________________________________________________________________________________________________________________ _____________________________________________________________________________________________________________________________________ _____________________________________________________________________________________________________________________________________  WHEN TO CALL DR WAKEFIELD: Fever over 101.0 Nausea and/or vomiting. Extreme swelling or  bruising. Continued bleeding from incision. Increased pain, redness, or drainage from the incision.  The clinic staff is available to answer your questions during regular  business hours.  Please don't hesitate to call and ask to speak to one of the nurses for clinical concerns.  If you have a medical emergency, go to the nearest emergency room or call 911.  A surgeon from [facility] is always on call at the hospital.  For further questions, please visit [email or web address]  Post Anesthesia Home Care Instructions  Activity: Get plenty of rest for the remainder of the day. A responsible individual must stay with you for 24 hours following the procedure.  For the next 24 hours, DO NOT: -Drive a car -Paediatric nurse -Drink alcoholic beverages -Take any medication unless instructed by your physician -Make any legal decisions or sign important papers.  Meals: Start with liquid foods such as gelatin or soup. Progress to regular foods as tolerated. Avoid greasy, spicy, heavy foods. If nausea and/or vomiting occur, drink only clear liquids until the nausea and/or vomiting subsides. Call your physician if vomiting continues.  Special Instructions/Symptoms: Your throat may feel dry or sore from the anesthesia or the breathing tube placed in your throat during surgery. If this causes discomfort, gargle with warm salt water. The discomfort should disappear within 24 hours.  If you had a scopolamine patch placed behind your ear for the management of post- operative nausea and/or vomiting:  1. The medication in the patch is effective for 72 hours, after which it should be removed.  Wrap patch in a tissue and discard in the trash. Wash hands thoroughly with soap and water. 2. You may remove the patch earlier than 72 hours if you experience unpleasant side effects which may include dry mouth, dizziness or visual disturbances. 3. Avoid touching the patch. Wash your hands with  soap and water after contact with the patch.

## 2020-12-18 NOTE — Anesthesia Procedure Notes (Signed)
Procedure Name: LMA Insertion Date/Time: 12/18/2020 7:35 AM Performed by: Lavonia Dana, CRNA Pre-anesthesia Checklist: Patient identified, Emergency Drugs available, Suction available and Patient being monitored Patient Re-evaluated:Patient Re-evaluated prior to induction Oxygen Delivery Method: Circle system utilized Preoxygenation: Pre-oxygenation with 100% oxygen Induction Type: IV induction Ventilation: Mask ventilation without difficulty LMA: LMA inserted LMA Size: 4.0 Number of attempts: 1 Airway Equipment and Method: Bite block Placement Confirmation: positive ETCO2 Tube secured with: Tape Dental Injury: Teeth and Oropharynx as per pre-operative assessment

## 2020-12-18 NOTE — Interval H&P Note (Signed)
History and Physical Interval Note:  12/18/2020 7:19 AM  Tara Yates  has presented today for surgery, with the diagnosis of LEFT BREAST DCIS.  The various methods of treatment have been discussed with the patient and family. After consideration of risks, benefits and other options for treatment, the patient has consented to  Procedure(s): LEFT BREAST LUMPECTOMY WITH RADIOACTIVE SEED LOCALIZATION (Left) as a surgical intervention.  The patient's history has been reviewed, patient examined, no change in status, stable for surgery.  I have reviewed the patient's chart and labs.  Questions were answered to the patient's satisfaction.     Rolm Bookbinder

## 2020-12-18 NOTE — Anesthesia Preprocedure Evaluation (Signed)
Anesthesia Evaluation  Patient identified by MRN, date of birth, ID band Patient awake    Reviewed: Allergy & Precautions, NPO status , Patient's Chart, lab work & pertinent test results  History of Anesthesia Complications Negative for: history of anesthetic complications  Airway Mallampati: I  TM Distance: >3 FB Neck ROM: Full    Dental  (+) Dental Advisory Given   Pulmonary former smoker,    breath sounds clear to auscultation       Cardiovascular (-) anginanegative cardio ROS   Rhythm:Regular Rate:Normal     Neuro/Psych Anxiety Depression negative neurological ROS     GI/Hepatic negative GI ROS, Neg liver ROS,   Endo/Other  Hypothyroidism   Renal/GU negative Renal ROS     Musculoskeletal   Abdominal   Peds  Hematology negative hematology ROS (+)   Anesthesia Other Findings   Reproductive/Obstetrics LMP 03/29/15                             Anesthesia Physical  Anesthesia Plan  ASA: II  Anesthesia Plan: General   Post-op Pain Management:    Induction: Intravenous  PONV Risk Score and Plan: 3 and Ondansetron, Dexamethasone and Midazolam  Airway Management Planned: LMA  Additional Equipment:   Intra-op Plan:   Post-operative Plan:   Informed Consent: I have reviewed the patients History and Physical, chart, labs and discussed the procedure including the risks, benefits and alternatives for the proposed anesthesia with the patient or authorized representative who has indicated his/her understanding and acceptance.     Dental advisory given  Plan Discussed with: CRNA and Surgeon  Anesthesia Plan Comments: (Plan routine monitors, GA-LMA OK)        Anesthesia Quick Evaluation

## 2020-12-19 ENCOUNTER — Encounter (HOSPITAL_BASED_OUTPATIENT_CLINIC_OR_DEPARTMENT_OTHER): Payer: Self-pay | Admitting: General Surgery

## 2020-12-21 ENCOUNTER — Telehealth: Payer: Self-pay | Admitting: Hematology

## 2020-12-21 LAB — SURGICAL PATHOLOGY

## 2020-12-21 NOTE — Telephone Encounter (Signed)
Scheduled appt per 6/16 referral. Pt aware.

## 2020-12-25 ENCOUNTER — Ambulatory Visit
Admission: RE | Admit: 2020-12-25 | Discharge: 2020-12-25 | Disposition: A | Discharge status: Home / Self Care | Payer: 59 | Source: Ambulatory Visit | Attending: Radiation Oncology | Admitting: Radiation Oncology

## 2020-12-25 ENCOUNTER — Other Ambulatory Visit: Payer: Self-pay

## 2020-12-25 ENCOUNTER — Encounter: Payer: Self-pay | Admitting: Radiation Oncology

## 2020-12-25 VITALS — BP 121/73 | HR 68 | Temp 97.9°F | Resp 18 | Ht 64.0 in | Wt 165.2 lb

## 2020-12-25 DIAGNOSIS — Z87891 Personal history of nicotine dependence: Secondary | ICD-10-CM | POA: Diagnosis not present

## 2020-12-25 DIAGNOSIS — D0512 Intraductal carcinoma in situ of left breast: Secondary | ICD-10-CM | POA: Insufficient documentation

## 2020-12-25 DIAGNOSIS — N6022 Fibroadenosis of left breast: Secondary | ICD-10-CM | POA: Insufficient documentation

## 2020-12-25 DIAGNOSIS — Z79899 Other long term (current) drug therapy: Secondary | ICD-10-CM | POA: Diagnosis not present

## 2020-12-25 DIAGNOSIS — Z170 Estrogen receptor positive status [ER+]: Secondary | ICD-10-CM | POA: Insufficient documentation

## 2020-12-25 DIAGNOSIS — E039 Hypothyroidism, unspecified: Secondary | ICD-10-CM | POA: Diagnosis not present

## 2020-12-25 DIAGNOSIS — M545 Low back pain, unspecified: Secondary | ICD-10-CM | POA: Diagnosis not present

## 2020-12-25 DIAGNOSIS — E785 Hyperlipidemia, unspecified: Secondary | ICD-10-CM | POA: Diagnosis not present

## 2020-12-25 DIAGNOSIS — K5900 Constipation, unspecified: Secondary | ICD-10-CM | POA: Insufficient documentation

## 2020-12-25 NOTE — Progress Notes (Signed)
Patient in for consult for DCIS. Patient is considering her options.  No prior radiation, No chance of preganacy, no pacemeaker, no methotraxate. Questions and concerns addressed.

## 2020-12-25 NOTE — Progress Notes (Signed)
Radiation Oncology         [phone] ________________________________  Name: Tara Yates        MRN: [id number]  Date of Service: 12/25/2020 DOB: [birth year]  NF:AOZHY, Langley Adie, MD  Rolm Bookbinder, MD     REFERRING PHYSICIAN: Rolm Bookbinder, MD   DIAGNOSIS: The encounter diagnosis was Ductal carcinoma in situ of left breast.   HISTORY OF PRESENT ILLNESS: Tara Yates is a 55 y.o. female seen at the request of Dr. Donne Hazel for a newly diagnosed left breast cancer.  The patient was seen for screening mammography in March 2022, but there was a possible distortion seen in the left breast.  Further diagnostic work-up showed no suspicious mass by ultrasound and her left axilla was negative.  Diagnostic mammogram however showed possible subtle distortion in the upper outer quadrant of the left breast and a stereotactic biopsy was obtained on 10/16/2020 showing intermediate grade DCIS that was ER/PR positive.  She was counseled on lumpectomy and underwent left lumpectomy on 12/18/2020 which showed DCIS intermediate grade involving areas of sclerosing adenosis.  Her margins were all negative for DCIS the closest margin being 3 mm, the overall size of the malignant component was not able to be determined, additional lateral and posterior margins were negative for disease.  She is seen today to discuss treatment recommendations in the adjuvant setting for reducing risks of recurrence.   PREVIOUS RADIATION THERAPY: No   PAST MEDICAL HISTORY:  Past Medical History:  Diagnosis Date   Allergy    Anxiety    Back pain    Constipation    Depression    Ductal carcinoma in situ of left breast 11/09/2020   Family history of breast cancer 11/09/2020   Hyperlipidemia    Hypothyroidism    Joint pain    Plantar fasciitis, bilateral    SOBOE (shortness of breath on exertion)        PAST SURGICAL HISTORY: Past Surgical History:  Procedure Laterality Date   BREAST BIOPSY Right  02/28/2015   BREAST EXCISIONAL BIOPSY Right 03/29/2015   had seed placement as wel   BREAST LUMPECTOMY WITH RADIOACTIVE SEED LOCALIZATION Left 12/18/2020   Procedure: LEFT BREAST LUMPECTOMY WITH RADIOACTIVE SEED LOCALIZATION;  Surgeon: Rolm Bookbinder, MD;  Location: [facility];  Service: General;  Laterality: Left;   ENDOMETRIAL ABLATION     MOUTH SURGERY     RADIOACTIVE SEED GUIDED EXCISIONAL BREAST BIOPSY Right 04/03/2015   Procedure: RADIOACTIVE SEED GUIDED EXCISIONAL BREAST BIOPSY;  Surgeon: Rolm Bookbinder, MD;  Location: [facility];  Service: General;  Laterality: Right;     FAMILY HISTORY:  Family History  Problem Relation Age of Onset   Heart disease Mother    Heart failure Mother    Heart attack Mother    Hyperlipidemia Mother    Sudden death Mother    Heart disease Father    Hyperlipidemia Father    Hypertension Father    Thyroid disease Father    Sleep apnea Father    Obesity Father    Breast cancer Paternal Grandmother        dx 73s, d. 10   Breast cancer Other        PGM's sisters, x2, dx unknown age   Colon cancer Neg Hx    Esophageal cancer Neg Hx    Rectal cancer Neg Hx    Stomach cancer Neg Hx      SOCIAL HISTORY:  reports that she quit smoking  about 22 years ago. Her smoking use included cigarettes. She has a 29.00 pack-year smoking history. She has never used smokeless tobacco. She reports current alcohol use. She reports that she does not use drugs. The patient is divorced but in a relationship with Mr. Elms who accompanies her. She has a teenage daughter in high school and a son in college. 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   ALLERGIES: Patient has no known allergies.   MEDICATIONS:  Current Outpatient Medications  Medication Sig Dispense Refill   Biotin 10000 MCG TABS Take by mouth.     Collagen-Vitamin C-Biotin (COLLAGEN 1500/C PO) Take by mouth.     levothyroxine  (SYNTHROID) 25 MCG tablet TAKE 1 TABLET(25 MCG) BY MOUTH DAILY BEFORE AND BREAKFAST 90 tablet 1   Loratadine 10 MG CHEW Chew 10 mg by mouth daily as needed.     polyethylene glycol (MIRALAX / GLYCOLAX) 17 g packet Take 17 g by mouth daily.     sertraline (ZOLOFT) 50 MG tablet Take 1 tablet (50 mg total) by mouth daily. 30 tablet 4   traMADol (ULTRAM) 50 MG tablet Take 2 tablets (100 mg total) by mouth every 6 (six) hours as needed. 6 tablet 0   No current facility-administered medications for this encounter.     REVIEW OF SYSTEMS: On review of systems, the patient reports that she is doing well overall.  She states she's healing nicely and is not having any major concerns with pain or healing.     PHYSICAL EXAM:  Wt Readings from Last 3 Encounters:  12/25/20 165 lb 3.2 oz (74.9 kg)  12/18/20 165 lb 12.6 oz (75.2 kg)  08/17/20 174 lb 12.8 oz (79.3 kg)   Temp Readings from Last 3 Encounters:  12/25/20 97.9 F (36.6 C)  12/18/20 97.6 F (36.4 C)  08/17/20 98.1 F (36.7 C) (Oral)   BP Readings from Last 3 Encounters:  12/25/20 121/73  12/18/20 116/65  08/17/20 102/78   Pulse Readings from Last 3 Encounters:  12/25/20 68  12/18/20 76  08/17/20 75   Pain Assessment Pain Score: 0-No pain/10  In general this is a well appearing caucasian female in no acute distress. She's alert and oriented x4 and appropriate throughout the examination. Cardiopulmonary assessment is negative for acute distress and she exhibits normal effort. Her left breast incision is healing well with intact steri strips. No erythema, separation or fluctuance is noted.    ECOG = 0  0 - Asymptomatic (Fully active, able to carry on all predisease activities without restriction)  1 - Symptomatic but completely ambulatory (Restricted in physically strenuous activity but ambulatory and able to carry out work of a light or sedentary nature. For example, light housework, office work)  2 - Symptomatic, <50% in  bed during the day (Ambulatory and capable of all self care but unable to carry out any work activities. Up and about more than 50% of waking hours)  3 - Symptomatic, >50% in bed, but not bedbound (Capable of only limited self-care, confined to bed or chair 50% or more of waking hours)  4 - Bedbound (Completely disabled. Cannot carry on any self-care. Totally confined to bed or chair)  5 - Death   Oken MM, Creech RH, Tormey DC, et al. (1982). "Toxicity and response criteria of the Eastern Cooperative Oncology Group". Am. J. Clin. Oncol. 5 (6): 649-55    LABORATORY DATA:  Lab Results  Component Value Date   WBC 4.3 08/17/2020     HGB 13.4 08/17/2020   HCT 39.4 08/17/2020   MCV 90.3 08/17/2020   PLT 223.0 08/17/2020   Lab Results  Component Value Date   NA 142 08/17/2020   K 4.1 08/17/2020   CL 102 08/17/2020   CO2 29 08/17/2020   Lab Results  Component Value Date   ALT 27 05/15/2020   AST 16 05/15/2020   ALKPHOS 93 05/15/2020   BILITOT 0.4 05/15/2020      RADIOGRAPHY: MM Breast Surgical Specimen  Result Date: 12/18/2020 CLINICAL DATA:  Status post seed localized LEFT lumpectomy. EXAM: SPECIMEN RADIOGRAPH OF THE LEFT BREAST COMPARISON:  Previous exam(s). FINDINGS: Status post excision of the left breast. The radioactive seed and coil shaped biopsy marker clip are present and intact. The findings are discussed with the operating room nurse at the time of interpretation. IMPRESSION: Specimen radiograph of the LEFT breast. Electronically Signed   By: Elizabeth  Brown M.D.   On: 12/18/2020 08:12  MM LT RADIOACTIVE SEED LOC MAMMO GUIDE  Result Date: 12/17/2020 CLINICAL DATA:  Pre lumpectomy localization of recently diagnosed upper outer left breast DCIS. EXAM: MAMMOGRAPHIC GUIDED RADIOACTIVE SEED LOCALIZATION OF THE LEFT BREAST COMPARISON:  Previous exam(s). FINDINGS: Patient presents for radioactive seed localization prior to left lumpectomy. I met with the patient and we discussed  the procedure of seed localization including benefits and alternatives. We discussed the high likelihood of a successful procedure. We discussed the risks of the procedure including infection, bleeding, tissue injury and further surgery. We discussed the low dose of radioactivity involved in the procedure. Informed, written consent was given. The usual time-out protocol was performed immediately prior to the procedure. Using mammographic guidance, sterile technique, 1% lidocaine and an I-125 radioactive seed, the recently placed coil shaped biopsy marker clip in the upper outer left breast was localized using a lateral approach. The follow-up mammogram images confirm the seed in the expected location and were marked for Dr. Wakefield. Follow-up survey of the patient confirms presence of the radioactive seed. Order number of I-125 seed:  [id number]. Total activity:  0.252 mCi reference Date: 11/08/2020 The patient tolerated the procedure well and was [facility]. She was given instructions regarding seed removal. IMPRESSION: Radioactive seed localization left breast. No apparent complications. Electronically Signed   By: Steven  Reid M.D.   On: 12/17/2020 13:36      IMPRESSION/PLAN: 1. ER/PR positive Intermediate grade DCIS of the left breast. Dr. Moody discusses the pathology findings and reviews the nature of noninvasive breast disease. She is healing well from her left lumpectomy. She is counseled on the rationale external radiotherapy to the breast  to reduce risks of local recurrence followed by antiestrogen therapy. We discussed the risks, benefits, short, and long term effects of radiotherapy, as well as the curative intent, and the patient is going to go home and think about our discussion prior to making a decision. She will also meet Dr. Feng later this week and review antiestrogen therapy. Dr. Moody discusses the delivery and logistics of radiotherapy and anticipates a course of 4  weeks of radiotherapy with deep inspiration breath hold technique. I will follow up with her next week to see if she has any questions prior to making a decision for or against radiotherapy.   In a visit lasting 60 minutes, greater than 50% of the time was spent face to face discussing the patient's condition, in preparation for the discussion, and coordinating the patient's care.   The above documentation reflects my   direct findings during this shared patient visit. Please see the separate note by Dr. Lisbeth Renshaw on this date for the remainder of the patient's plan of care.    Carola Rhine, [facility]   **Disclaimer: This note was dictated with voice recognition software. Similar sounding words can inadvertently be transcribed and this note may contain transcription errors which may not have been corrected upon publication of note.**

## 2020-12-26 NOTE — Progress Notes (Addendum)
[place]   [phone] Fax:([phone]) [place] Note   Patient Care Team: Billie Ruddy, MD as PCP - General (Family Medicine) Mauro Kaufmann, RN as Oncology Nurse Navigator Rockwell Germany, RN as Oncology Nurse Navigator  Date of Service:  12/27/2020   CHIEF COMPLAINTS/PURPOSE OF CONSULTATION:  Left breast DCIS  REFERRING PHYSICIAN:  Dr. Donne Hazel  Oncology History  Ductal carcinoma in situ of left breast  10/16/2020 Pathology Results   Breast, left, needle core biopsy, UOQ posterior - DUCTAL CARCINOMA IN SITU WITH CALCIFICATIONS, INTERMEDIATE NUCLEAR GRADE  PROGNOSTIC INDICATORS Results: IMMUNOHISTOCHEMICAL AND MORPHOMETRIC ANALYSIS PERFORMED MANUALLY Estrogen Receptor: 95%, POSITIVE, STRONG STAINING INTENSITY Progesterone Receptor: 20%, POSITIVE, STRONG STAINING INTENSITY   11/09/2020 Initial Diagnosis   Ductal carcinoma in situ of left breast   11/16/2020 Genetic Testing   Negative hereditary cancer genetic testing: no pathogenic variants detected in [facility] BRCAPlus Panel.  The report date is Nov 16, 2020. The BRCAplus panel offered by [employer] and includes sequencing and deletion/duplication analysis for the following 8 genes: ATM, BRCA1, BRCA2, CDH1, CHEK2, PALB2, PTEN, and TP53.    Negative hereditary cancer genetic testing: no pathogenic variants detected in [facility] CustomNext-Cancer +RNAinsight Panel.  Variant of uncertain significance detected in CDKN2A at c.440C>T (p.A147V). The report date is Nov 26, 2020.   The CustomNext-Cancer+RNAinsight panel offered by [facility] includes sequencing and rearrangement analysis for the following 47 genes:  APC, ATM, AXIN2, BARD1, BMPR1A, BRCA1, BRCA2, BRIP1, CDH1, CDK4, CDKN2A, CHEK2, DICER1, EPCAM, GREM1, HOXB13, MEN1, MLH1, MSH2, MSH3, MSH6, MUTYH, NBN, NF1, NF2, NTHL1, PALB2, PMS2, POLD1, POLE, PTEN, RAD51C, RAD51D, RECQL, RET, SDHA, SDHAF2, SDHB, SDHC, SDHD, SMAD4, SMARCA4,  STK11, TP53, TSC1, TSC2, and VHL.  RNA data is routinely analyzed for use in variant interpretation for all genes.    12/18/2020 Surgery   Left Lumpectomy  FINAL MICROSCOPIC DIAGNOSIS:   A. BREAST, LEFT, LUMPECTOMY:  - Ductal carcinoma in situ, intermediate grade, involving areas of  sclerosing adenosis, see comment  - Resection margins are negative for DCIS; closest is the posterior  margin at 3 mm  - Biopsy site changes  - Calcifications  - See oncology table   B. BREAST, LEFT ADDITIONAL LATERAL MARGIN, EXCISION:  - Fibrocystic change with sclerosing adenosis and calcifications  - Negative for carcinoma   C. BREAST, LEFT ADDITIONAL POSTERIOR MARGIN, EXCISION:  - Benign breast parenchyma, negative for carcinoma       HISTORY OF PRESENTING ILLNESS:  Tara Yates 55 y.o. female is a here because of DCIS. The patient was referred by Dr. Donne Hazel. The patient presents to the clinic today accompanied by [name], her significant other.   She had routine screening mammography on 09/19/20 showing a possible abnormality in the left breast. She underwent left diagnostic mammography and left breast ultrasonography on 10/10/20 showing: breast density category C; possible subtle distortion within upper-outer left breast without sonographic correlate; no abnormal-appearing left axillary lymph nodes.  Biopsy on 10/16/20 showed: DCIS, intermediate grade. Prognostic indicators significant for: estrogen receptor, 95% positive and progesterone receptor, 20% positive.   She underwent genetic testing on 11/08/20. Results were negative, with a VUS in CDKN2A.  She proceeded to left lumpectomy on 12/18/20 under Dr. Donne Hazel. Pathology showed: DCIS, intermediate grade, involving areas of sclerosing adenosis; margins negative for DCIS.  She met with Dr. Lisbeth Renshaw on 12/25/20 to discuss adjuvant radiation therapy. She is undecided regarding whether she wanted to pursue treatment.   Today the  patient notes  she had some swelling following surgery, but this has resolved. She denies any pain, and she notes she is back to her usual activities.   She has a PMHx of....  -Right lumpectomy in 03/2015 for a complex sclerosing lesion. -Mild hypothyroidism, on synthroid -Depression, on Zoloft -Back pain, on anti-inflammatory diet that helps, noted to have some arthritis in back -Laparoscopic procedure for endometriosis   Socially... 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 She has a teenage daughter in high school and a son in college. The patient is divorced but in a relationship. She drinks a glass of wine daily. She is working to cut back. Please see genetic counseling note from 11/08/20 for full family information.   GYN HISTORY  Menarchal: 55 years old LMP: 08/2019* Contraceptive: used from age 45 to menopause HRT:  GP: 2, age 35 at first live birth  *[facility] from 09/2019 was 85.7 miu/L   REVIEW OF SYSTEMS:    Constitutional: Denies fevers, chills or abnormal night sweats Eyes: Denies blurriness of vision, double vision or watery eyes Ears, nose, mouth, throat, and face: Denies mucositis or sore throat Respiratory: Denies cough, dyspnea or wheezes Cardiovascular: Denies palpitation, chest discomfort or lower extremity swelling Gastrointestinal:  Denies nausea, heartburn or change in bowel habits Skin: Denies abnormal skin rashes Lymphatics: Denies new lymphadenopathy or easy bruising Neurological:Denies numbness, tingling or new weaknesses Behavioral/Psych: Mood is stable, no new changes  All other systems were reviewed with the patient and are negative.   MEDICAL HISTORY:  Past Medical History:  Diagnosis Date   Allergy    Anxiety    Back pain    Constipation    Depression    Ductal carcinoma in situ of left breast 11/09/2020   Family history of breast cancer 11/09/2020   Hyperlipidemia    Hypothyroidism    Joint pain    Plantar fasciitis,  bilateral    SOBOE (shortness of breath on exertion)     SURGICAL HISTORY: Past Surgical History:  Procedure Laterality Date   BREAST BIOPSY Right 02/28/2015   BREAST EXCISIONAL BIOPSY Right 03/29/2015   had seed placement as wel   BREAST LUMPECTOMY WITH RADIOACTIVE SEED LOCALIZATION Left 12/18/2020   Procedure: LEFT BREAST LUMPECTOMY WITH RADIOACTIVE SEED LOCALIZATION;  Surgeon: Rolm Bookbinder, MD;  Location: [place];  Service: General;  Laterality: Left;   ENDOMETRIAL ABLATION     MOUTH SURGERY     RADIOACTIVE SEED GUIDED EXCISIONAL BREAST BIOPSY Right 04/03/2015   Procedure: RADIOACTIVE SEED GUIDED EXCISIONAL BREAST BIOPSY;  Surgeon: Rolm Bookbinder, MD;  Location: [place];  Service: General;  Laterality: Right;    SOCIAL HISTORY: Social History   Socioeconomic History   Marital status: Divorced    Spouse name: Not on file   Number of children: 2   Years of education: Not on file   Highest education level: Not on file  Occupational History   Not on file  Tobacco Use   Smoking status: Former    Packs/day: 1.00    Years: 29.00    Pack years: 29.00    Types: Cigarettes    Quit date: 2000    Years since quitting: 22.4   Smokeless tobacco: Never   Tobacco comments:    Quit 20 years ago  Vaping Use   Vaping Use: Never used  Substance and Sexual Activity   Alcohol use: Yes    Alcohol/week: 7.0 standard drinks    Types:  7 Glasses of wine per week    Comment: Wine daily   Drug use: Never   Sexual activity: Not Currently    Birth control/protection: Post-menopausal    Comment: ablation  Other Topics Concern   Not on file  Social History Narrative   Not on file   Social Determinants of Health   Financial Resource Strain: Not on file  Food Insecurity: Not on file  Transportation Needs: Not on file  Physical Activity: Not on file  Stress: Not on file  Social Connections: Not on file  Intimate Partner Violence: Not on file     FAMILY HISTORY: Family History  Problem Relation Age of Onset   Heart disease Mother    Heart failure Mother    Heart attack Mother    Hyperlipidemia Mother    Sudden death Mother    Heart disease Father    Hyperlipidemia Father    Hypertension Father    Thyroid disease Father    Sleep apnea Father    Obesity Father    Breast cancer Paternal Grandmother        dx 25s, d. 92   Breast cancer Other        PGM's sisters, x2, dx unknown age   Colon cancer Neg Hx    Esophageal cancer Neg Hx    Rectal cancer Neg Hx    Stomach cancer Neg Hx     ALLERGIES:  has No Known Allergies.  MEDICATIONS:  Current Outpatient Medications  Medication Sig Dispense Refill   Biotin 10000 MCG TABS Take by mouth.     Collagen-Vitamin C-Biotin (COLLAGEN 1500/C PO) Take by mouth.     levothyroxine (SYNTHROID) 25 MCG tablet TAKE 1 TABLET(25 MCG) BY MOUTH DAILY BEFORE AND BREAKFAST 90 tablet 1   Loratadine 10 MG CHEW Chew 10 mg by mouth daily as needed.     polyethylene glycol (MIRALAX / GLYCOLAX) 17 g packet Take 17 g by mouth daily.     sertraline (ZOLOFT) 50 MG tablet Take 1 tablet (50 mg total) by mouth daily. 30 tablet 4   No current facility-administered medications for this visit.    PHYSICAL EXAMINATION: ECOG PERFORMANCE STATUS: 0 - Asymptomatic  Vitals:   12/27/20 1519  BP: 119/73  Pulse: 67  Resp: 20  Temp: 98.4 F (36.9 C)  SpO2: 98%   Filed Weights   12/27/20 1519  Weight: 166 lb 11.2 oz (75.6 kg)    Due to COVID19 we will limit examination to appearance. Patient had no complaints.  GENERAL:alert, no distress and comfortable SKIN: skin color normal, no rashes or significant lesions EYES: normal, Conjunctiva are pink and non-injected, sclera clear  NEURO: alert & oriented x 3 with fluent speech  LABORATORY DATA:  I have reviewed the data as listed CBC Latest Ref Rng & Units 08/17/2020 05/15/2020 06/16/2019  WBC 4.0 - 10.5 K/uL 4.3 4.5 5.4  Hemoglobin 12.0 - 15.0 g/dL  13.4 14.1 13.4  Hematocrit 36.0 - 46.0 % 39.4 41.3 40.0  Platelets 150.0 - 400.0 K/uL 223.0 234 224.0    CMP Latest Ref Rng & Units 08/17/2020 05/15/2020 06/16/2019  Glucose 70 - 99 mg/dL 86 103(H) 97  BUN 6 - 23 mg/dL 11 13 12   Creatinine 0.40 - 1.20 mg/dL 0.70 0.73 0.67  Sodium 135 - 145 mEq/L 142 140 138  Potassium 3.5 - 5.1 mEq/L 4.1 4.5 4.6  Chloride 96 - 112 mEq/L 102 100 102  CO2 19 - 32 mEq/L 29 22 27  Calcium 8.4 - 10.5 mg/dL 9.6 9.5 9.1  Total Protein 6.0 - 8.5 g/dL - 7.5 -  Total Bilirubin 0.0 - 1.2 mg/dL - 0.4 -  Alkaline Phos 44 - 121 IU/L - 93 -  AST 0 - 40 IU/L - 16 -  ALT 0 - 32 IU/L - 27 -     RADIOGRAPHIC STUDIES: I have personally reviewed the radiological images as listed and agreed with the findings in the report. MM Breast Surgical Specimen  Result Date: 12/18/2020 CLINICAL DATA:  Status post seed localized LEFT lumpectomy. EXAM: SPECIMEN RADIOGRAPH OF THE LEFT BREAST COMPARISON:  Previous exam(s). FINDINGS: Status post excision of the left breast. The radioactive seed and coil shaped biopsy marker clip are present and intact. The findings are discussed with the operating room nurse at the time of interpretation. IMPRESSION: Specimen radiograph of the LEFT breast. Electronically Signed   By: Nolon Nations M.D.   On: 12/18/2020 08:12  MM LT RADIOACTIVE SEED LOC MAMMO GUIDE  Result Date: 12/17/2020 CLINICAL DATA:  Pre lumpectomy localization of recently diagnosed upper outer left breast DCIS. EXAM: MAMMOGRAPHIC GUIDED RADIOACTIVE SEED LOCALIZATION OF THE LEFT BREAST COMPARISON:  Previous exam(s). FINDINGS: Patient presents for radioactive seed localization prior to left lumpectomy. I met with the patient and we discussed the procedure of seed localization including benefits and alternatives. We discussed the high likelihood of a successful procedure. We discussed the risks of the procedure including infection, bleeding, tissue injury and further surgery. We  discussed the low dose of radioactivity involved in the procedure. Informed, written consent was given. The usual time-out protocol was performed immediately prior to the procedure. Using mammographic guidance, sterile technique, 1% lidocaine and an I-125 radioactive seed, the recently placed coil shaped biopsy marker clip in the upper outer left breast was localized using a lateral approach. The follow-up mammogram images confirm the seed in the expected location and were marked for Dr. Donne Hazel. Follow-up survey of the patient confirms presence of the radioactive seed. Order number of I-125 seed:  [id number]. Total activity:  0.252 mCi reference Date: 11/08/2020 The patient tolerated the procedure well and was released from the [place]. She was given instructions regarding seed removal. IMPRESSION: Radioactive seed localization left breast. No apparent complications. Electronically Signed   By: Claudie Revering M.D.   On: 12/17/2020 13:36   ASSESSMENT & PLAN:  Areatha Kalata is a 55 y.o. white female with a history of   1. Left breast DCIS, grade 2, ER/PR+ -Discovered on screening mammography. Biopsy on 10/16/20 confirmed intermediate grade DCIS, ER/PR+. -Left lumpectomy on 12/18/20 again showed grade 2 DCIS with clear margins  -She met with Dr. Lisbeth Renshaw on 12/25/20 to discuss adjuvant radiation therapy. She is undecided. -Given the strong ER and PR positivity, I do recommend adjuvant aromatase inhibitor to reduce her risk of cancer recurrence,  The potential benefit and side effects, which includes but not limited to, hot flash, skin and vaginal dryness, metabolic changes ( increased blood glucose, cholesterol, weight, etc.), slightly in increased risk of cardiovascular disease, cataracts, muscular and joint discomfort, osteopenia and osteoporosis, etc, were discussed with her in great details. I provided educational print-outs for her today. ---I also discussed the option of tamoxifen. The potential  side effects, which includes but not limited to, hot flash, skin and vaginal dryness, slightly increased risk of cardiovascular disease and cataract, small risk of thrombosis and endometrial cancer, were discussed with her in great details. Preventive strategies for thrombosis, such as being  physically active, using compression stocks, avoid cigarette smoking, etc., were reviewed with her. I also recommend her to follow-up with her gynecologist once a year, and watch for vaginal spotting or bleeding, as a clinically sign of endometrial cancer, etc. She voiced good understanding, and would like to think about it.  -She is postmenopausal, I recommend a baseline bone density scan, which may also help to choose the antiestrogen therapy drug. -We discussed the benefits and risk of recurrence with radiation alone, AI alone, both, and neither. In her case, antiestrogens are preventative. She would prefer to do one or the other. We discussed this in great detail today. She would like to take more time to consider her options. -She last saw Dr. Donne Hazel yesterday, 12/26/20. As such, breast exam deferred today. -We discussed healthy lifestyle for cancer prevention, which including healthy diet, regular exercise, positive mood, and weight management to avoid overweight or obesity.  She does a good job on this. -She also had several questions about supplements.  I recommend vitamin D and turmeric  -We also discussed surveillance with alternating mammography and breast MRI. I also recommended our breast survivorship for her to attend in 6 months, she is agreeable.  2. Negative genetic testing 11/08/20 - VUS in CDKN2A   PLAN:  -survivorship with NP Lacie in 6 months -she will contact us or Dr. Lisbeth Renshaw with her decision regarding adjuvant irradiation versus antiestrogen therapy. -Bone density scan in 2 to 4 weeks at the breast center -I will share our discussion with Dr. Donne Hazel and Dr. Lisbeth Renshaw. -f/u open   Orders  Placed This Encounter  Procedures   DG Bone Density    Standing Status:   Future    Standing Expiration Date:   12/27/2021    Order Specific Question:   Reason for Exam (SYMPTOM  OR DIAGNOSIS REQUIRED)    Answer:   Screening    Order Specific Question:   Is the patient pregnant?    Answer:   No    Order Specific Question:   Preferred imaging location?    Answer:   [facility]     All questions were answered. The patient knows to call the clinic with any problems, questions or concerns. The total time spent in the appointment was 45 minutes.     Truitt Merle, MD 12/27/2020 4:22 PM  I, Wilburn Mylar, am acting as scribe for Truitt Merle, MD.   I have reviewed the above documentation for accuracy and completeness, and I agree with the above.

## 2020-12-27 ENCOUNTER — Encounter: Payer: Self-pay | Admitting: *Deleted

## 2020-12-27 ENCOUNTER — Other Ambulatory Visit: Payer: Self-pay

## 2020-12-27 ENCOUNTER — Encounter: Payer: Self-pay | Admitting: Hematology

## 2020-12-27 ENCOUNTER — Inpatient Hospital Stay: Payer: 59 | Attending: Genetic Counselor | Admitting: Hematology

## 2020-12-27 VITALS — BP 119/73 | HR 67 | Temp 98.4°F | Resp 20 | Ht 64.0 in | Wt 166.7 lb

## 2020-12-27 DIAGNOSIS — Z87891 Personal history of nicotine dependence: Secondary | ICD-10-CM

## 2020-12-27 DIAGNOSIS — E785 Hyperlipidemia, unspecified: Secondary | ICD-10-CM | POA: Diagnosis not present

## 2020-12-27 DIAGNOSIS — D0512 Intraductal carcinoma in situ of left breast: Secondary | ICD-10-CM | POA: Diagnosis not present

## 2020-12-27 DIAGNOSIS — Z79899 Other long term (current) drug therapy: Secondary | ICD-10-CM | POA: Diagnosis not present

## 2020-12-27 DIAGNOSIS — Z8349 Family history of other endocrine, nutritional and metabolic diseases: Secondary | ICD-10-CM

## 2020-12-27 DIAGNOSIS — N6022 Fibroadenosis of left breast: Secondary | ICD-10-CM

## 2020-12-27 DIAGNOSIS — M549 Dorsalgia, unspecified: Secondary | ICD-10-CM | POA: Diagnosis not present

## 2020-12-27 DIAGNOSIS — Z803 Family history of malignant neoplasm of breast: Secondary | ICD-10-CM | POA: Diagnosis not present

## 2020-12-27 DIAGNOSIS — Z7289 Other problems related to lifestyle: Secondary | ICD-10-CM | POA: Diagnosis not present

## 2020-12-27 DIAGNOSIS — Z79890 Hormone replacement therapy (postmenopausal): Secondary | ICD-10-CM | POA: Diagnosis not present

## 2020-12-27 DIAGNOSIS — E2839 Other primary ovarian failure: Secondary | ICD-10-CM

## 2020-12-27 DIAGNOSIS — F32A Depression, unspecified: Secondary | ICD-10-CM | POA: Diagnosis not present

## 2020-12-27 DIAGNOSIS — Z8249 Family history of ischemic heart disease and other diseases of the circulatory system: Secondary | ICD-10-CM | POA: Diagnosis not present

## 2020-12-27 DIAGNOSIS — Z836 Family history of other diseases of the respiratory system: Secondary | ICD-10-CM | POA: Diagnosis not present

## 2020-12-27 DIAGNOSIS — E039 Hypothyroidism, unspecified: Secondary | ICD-10-CM | POA: Insufficient documentation

## 2020-12-28 ENCOUNTER — Telehealth: Payer: Self-pay | Admitting: Hematology

## 2020-12-28 NOTE — Telephone Encounter (Signed)
Left message with follow-up appointment per 6/23 los.

## 2021-01-04 DIAGNOSIS — Z9889 Other specified postprocedural states: Secondary | ICD-10-CM | POA: Insufficient documentation

## 2021-01-06 ENCOUNTER — Other Ambulatory Visit: Payer: Self-pay

## 2021-01-06 ENCOUNTER — Encounter (HOSPITAL_COMMUNITY): Payer: Self-pay | Admitting: *Deleted

## 2021-01-06 ENCOUNTER — Ambulatory Visit (HOSPITAL_COMMUNITY)
Admission: EM | Admit: 2021-01-06 | Discharge: 2021-01-06 | Disposition: A | Discharge status: Home / Self Care | Payer: 59 | Attending: Physician Assistant | Admitting: Physician Assistant

## 2021-01-06 DIAGNOSIS — N766 Ulceration of vulva: Secondary | ICD-10-CM

## 2021-01-06 DIAGNOSIS — N949 Unspecified condition associated with female genital organs and menstrual cycle: Secondary | ICD-10-CM | POA: Diagnosis present

## 2021-01-06 MED ORDER — VALACYCLOVIR HCL 1 G PO TABS: 1000.0000 mg | ORAL_TABLET | Freq: Two times a day (BID) | ORAL | 0 refills | Status: AC

## 2021-01-06 NOTE — ED Provider Notes (Signed)
[place]    CSN: [id number] Arrival date & time: 01/06/21  1358      History   Chief Complaint Chief Complaint  Patient presents with   blisters on external labia and rectum     HPI Tara Yates is a 55 y.o. female.   Patient presents today with a several day history of ulcerative lesions on labia and rectum.  Reports that earlier this week she had a prodrome of fatigue, malaise, chills, subjective fever.  She was seen by her OB/GYN where she had negative urine culture.  She was reevaluated and treated for yeast which provided no relief of symptoms.  Several days ago she developed widespread ulcerative lesions prompting reevaluation today.  She reports intense pain she describes as burning, rated 9 on a 0-10 pain scale, generalized to affected area, no aggravating or alleviating factors identified.  She has not tried any over-the-counter medications for symptom management.  She is sexually active and reports that partner had oral lesion prior to performing oral sex on her but is unsure if this could be contributing symptoms.  She also reports using a new lubrication wonders if she could have been allergic.  Denies any additional changes to personal hygiene products including soaps or lotions.  She denies history of herpes but is interested in testing today.  She reports having complete STI panel several years ago that included antibody testing for HSV that was negative.  She does report increased after she had a lumpectomy several weeks ago and wonders if this impact her immune system leading to outbreak.  She denies episodes of similar symptoms in the past.   Past Medical History:  Diagnosis Date   Allergy    Anxiety    Back pain    Constipation    Depression    Ductal carcinoma in situ of left breast 11/09/2020   Family history of breast cancer 11/09/2020   Hyperlipidemia    Hypothyroidism    Joint pain    Plantar fasciitis, bilateral    SOBOE (shortness of  breath on exertion)     Patient Active Problem List   Diagnosis Date Noted   Genetic testing 11/16/2020   Ductal carcinoma in situ of left breast 11/09/2020   Family history of breast cancer 11/09/2020   At risk for dehydration 06/13/2020   Mood disorder ([place]), with emotional eating 05/29/2020   Other hyperlipidemia 05/29/2020   Insulin resistance 05/29/2020   Vitamin D deficiency 05/29/2020   Hyperlipidemia 05/15/2020   Other fatigue 05/15/2020   SOBOE (shortness of breath on exertion) 05/15/2020   GAD (generalized anxiety disorder) 05/15/2020   History of constipation 05/15/2020   Depression 05/15/2020   At risk for heart disease 05/15/2020   Plantar fasciitis 01/04/2020   Hypocholesterolemia 08/24/2018   Palpitations 03/19/2018   Adjustment disorder with depressed mood 10/01/2016   Hot flashes 05/20/2016   Preventative health care 05/20/2016   Depressive disorder 04/14/2016   Radial scar of breast 03/15/2015   Shingles rash 02/14/2015   Arthralgia 09/14/2014   Bilateral low back pain 09/14/2014   Bloating 09/14/2014   Constipation 09/14/2014   Hypothyroidism 09/14/2014   Insomnia 09/14/2014    Past Surgical History:  Procedure Laterality Date   BREAST BIOPSY Right 02/28/2015   BREAST EXCISIONAL BIOPSY Right 03/29/2015   had seed placement as wel   BREAST LUMPECTOMY WITH RADIOACTIVE SEED LOCALIZATION Left 12/18/2020   Procedure: LEFT BREAST LUMPECTOMY WITH RADIOACTIVE SEED LOCALIZATION;  Surgeon: Rolm Bookbinder, MD;  Location: [facility];  Service: General;  Laterality: Left;   ENDOMETRIAL ABLATION     MOUTH SURGERY     RADIOACTIVE SEED GUIDED EXCISIONAL BREAST BIOPSY Right 04/03/2015   Procedure: RADIOACTIVE SEED GUIDED EXCISIONAL BREAST BIOPSY;  Surgeon: Rolm Bookbinder, MD;  Location: [facility];  Service: General;  Laterality: Right;    OB History     Gravida  2   Para  2   Term      Preterm      AB      Living          SAB      IAB      Ectopic      Multiple      Live Births               Home Medications    Prior to Admission medications   Medication Sig Start Date End Date Taking? Authorizing Provider  valACYclovir (VALTREX) 1000 MG tablet Take 1 tablet (1,000 mg total) by mouth 2 (two) times daily for 10 days. 01/06/21 01/16/21 Yes Jamaria Amborn, Derry Skill, PA-C  Biotin 10000 MCG TABS Take by mouth.    [provider]  Collagen-Vitamin C-Biotin (COLLAGEN 1500/C PO) Take by mouth.    [provider]  levothyroxine (SYNTHROID) 25 MCG tablet TAKE 1 TABLET(25 MCG) BY MOUTH DAILY BEFORE AND BREAKFAST 12/10/20   Billie Ruddy, MD  Loratadine 10 MG CHEW Chew 10 mg by mouth daily as needed.    [provider]  polyethylene glycol (MIRALAX / GLYCOLAX) 17 g packet Take 17 g by mouth daily.    [provider]  sertraline (ZOLOFT) 50 MG tablet Take 1 tablet (50 mg total) by mouth daily. 11/07/20   Billie Ruddy, MD    Family History Family History  Problem Relation Age of Onset   Heart disease Mother    Heart failure Mother    Heart attack Mother    Hyperlipidemia Mother    Sudden death Mother    Heart disease Father    Hyperlipidemia Father    Hypertension Father    Thyroid disease Father    Sleep apnea Father    Obesity Father    Breast cancer Paternal Grandmother        dx 12s, d. 19   Breast cancer Other        PGM's sisters, x2, dx unknown age   Colon cancer Neg Hx    Esophageal cancer Neg Hx    Rectal cancer Neg Hx    Stomach cancer Neg Hx     Social History Social History   Tobacco Use   Smoking status: Former    Packs/day: 1.00    Years: 29.00    Pack years: 29.00    Types: Cigarettes    Quit date: 2000    Years since quitting: 22.5   Smokeless tobacco: Never   Tobacco comments:    Quit 20 years ago  Vaping Use   Vaping Use: Never used  Substance Use Topics   Alcohol use: Yes    Alcohol/week: 7.0 standard drinks    Types: 7  Glasses of wine per week    Comment: Wine daily   Drug use: Never     Allergies   Patient has no known allergies.   Review of Systems Review of Systems  Constitutional:  Positive for chills and fever (subjective). Negative for activity change, appetite change and fatigue.  Respiratory:  Negative  for cough and shortness of breath.   Cardiovascular:  Negative for chest pain.  Gastrointestinal:  Negative for abdominal pain, diarrhea, nausea and vomiting.  Genitourinary:  Positive for dysuria, vaginal discharge and vaginal pain. Negative for frequency, urgency and vaginal bleeding.  Neurological:  Negative for dizziness, light-headedness and headaches.    Physical Exam Triage Vital Signs ED Triage Vitals  Enc Vitals Group     BP 01/06/21 1528 122/67     Pulse Rate 01/06/21 1528 (!) 57     Resp 01/06/21 1528 18     Temp 01/06/21 1528 98.5 F (36.9 C)     Temp src --      SpO2 01/06/21 1528 96 %     Weight --      Height --      Head Circumference --      Peak Flow --      Pain Score 01/06/21 1525 10     Pain Loc --      Pain Edu? --      Excl. in [place]? --    No data found.  Updated Vital Signs BP 122/67   Pulse (!) 57   Temp 98.5 F (36.9 C)   Resp 18   SpO2 96%   Visual Acuity Right Eye Distance:   Left Eye Distance:   Bilateral Distance:    Right Eye Near:   Left Eye Near:    Bilateral Near:     Physical Exam Vitals reviewed.  Constitutional:      General: She is awake. She is not in acute distress.    Appearance: Normal appearance. She is normal weight. She is not ill-appearing.     Comments: Very pleasant female appears stated age no acute distress sitting comfortably on exam table  HENT:     Head: Normocephalic and atraumatic.  Cardiovascular:     Rate and Rhythm: Normal rate and regular rhythm.     Heart sounds: Normal heart sounds, S1 normal and S2 normal. No murmur heard. Pulmonary:     Effort: Pulmonary effort is normal.     Breath sounds:  Normal breath sounds. No wheezing, rhonchi or rales.     Comments: Clear to auscultation bilaterally Abdominal:     Palpations: Abdomen is soft.     Tenderness: There is no abdominal tenderness.     Comments: Benign abdominal exam  Genitourinary:    Labia:        Right: Rash and lesion present.        Left: Rash and lesion present.      Comments: Multiple ulcerated lesions noted on labia majora and labia minora.  Lesions are tender to palpation.  No bleeding or drainage noted.  Speculum exam not performed given tenderness to external genitalia on exam. Psychiatric:        Behavior: Behavior is cooperative.     UC Treatments / Results  Labs (all labs ordered are listed, but only abnormal results are displayed) Labs Reviewed  HSV CULTURE AND TYPING    EKG   Radiology No results found.  Procedures Procedures (including critical care time)  Medications Ordered in UC Medications - No data to display  Initial Impression / Assessment and Plan / UC Course  I have reviewed the triage vital signs and the nursing notes.  Pertinent labs & imaging results that were available during my care of the patient were reviewed by me and considered in my medical decision making (see chart for details).  Discussed with patient differential diagnosis including HSV versus allergic reaction leading to dermatitis causing ulcers.  Given clinical presentation more concerning for HSV.  HSV culture testing performed today-results pending.  Patient was started on Valtrex empirically but can stop this medication if HSV testing is negative.  Recommended she use hydrocortisone cream on external genitalia to help manage symptoms.  Recommended she use hypoallergenic soaps and detergents.  Encouraged her to follow-up with OB/GYN for further evaluation and management.  Discussed alarm symptoms that warrant emergent evaluation.  Strict return precautions given to which patient expressed understanding.  Final  Clinical Impressions(s) / UC Diagnoses   Final diagnoses:  Genital lesion, female  Genital ulcer, female     Discharge Instructions      Begin Valtrex twice daily.  Use hypoallergenic soaps and detergents.  We will contact you with your culture results if they are positive within an few days.  If you have any worsening symptoms please return for reevaluation.  Follow-up with OB/GYN.     ED Prescriptions     Medication Sig Dispense Auth. Provider   valACYclovir (VALTREX) 1000 MG tablet Take 1 tablet (1,000 mg total) by mouth 2 (two) times daily for 10 days. 20 tablet Jessica Seidman, Derry Skill, PA-C      PDMP not reviewed this encounter.   Terrilee Croak, PA-C 01/06/21 1615

## 2021-01-06 NOTE — Discharge Instructions (Addendum)
Begin Valtrex twice daily.  Use hypoallergenic soaps and detergents.  We will contact you with your culture results if they are positive within an few days.  If you have any worsening symptoms please return for reevaluation.  Follow-up with OB/GYN.

## 2021-01-06 NOTE — ED Triage Notes (Signed)
Pt reports blisters to rectum and external labia. Recent treatment for yeast by GYN.

## 2021-01-08 ENCOUNTER — Encounter: Payer: Self-pay | Admitting: Family Medicine

## 2021-01-09 LAB — HSV CULTURE AND TYPING

## 2021-01-10 ENCOUNTER — Encounter: Payer: Self-pay | Admitting: *Deleted

## 2021-01-10 ENCOUNTER — Telehealth: Payer: Self-pay | Admitting: *Deleted

## 2021-01-10 NOTE — Telephone Encounter (Signed)
Called pt to discuss decision on xrt vs AI. Pt relate she would like to have bone density to be deciding factor in decision making. Informed pt GI will contact with appt for bone density. Reached out to GI to schedule pt for bone density.

## 2021-01-17 ENCOUNTER — Encounter: Payer: Self-pay | Admitting: *Deleted

## 2021-01-21 ENCOUNTER — Telehealth: Payer: Self-pay | Admitting: *Deleted

## 2021-01-21 ENCOUNTER — Encounter: Payer: Self-pay | Admitting: *Deleted

## 2021-01-21 NOTE — Telephone Encounter (Signed)
Left vm with contact information to [facility] to call and schedule bone density test.

## 2021-01-28 ENCOUNTER — Other Ambulatory Visit: Payer: Self-pay | Admitting: Hematology

## 2021-01-28 DIAGNOSIS — E2839 Other primary ovarian failure: Secondary | ICD-10-CM

## 2021-01-29 ENCOUNTER — Telehealth: Payer: Self-pay | Admitting: Radiation Oncology

## 2021-01-29 NOTE — Telephone Encounter (Signed)
Called patient to schedule FUN w/ Shona Simpson, PA. No answer, LVM for return call.

## 2021-01-29 NOTE — Progress Notes (Signed)
New Breast Cancer Diagnosis: Left Breast  SAFETY ISSUES: Prior radiation? no Pacemaker/ICD? no Possible current pregnancy? ablation. Is the patient on methotrexate? No  Current Complaints / other details:

## 2021-01-30 ENCOUNTER — Ambulatory Visit
Admission: RE | Admit: 2021-01-30 | Discharge: 2021-01-30 | Disposition: A | Discharge status: Home / Self Care | Payer: 59 | Source: Ambulatory Visit | Attending: Radiation Oncology | Admitting: Radiation Oncology

## 2021-01-30 ENCOUNTER — Other Ambulatory Visit: Payer: Self-pay

## 2021-01-30 ENCOUNTER — Encounter: Payer: Self-pay | Admitting: Radiation Oncology

## 2021-01-30 VITALS — BP 127/74 | HR 68 | Temp 97.7°F | Resp 18 | Ht 64.0 in | Wt 164.1 lb

## 2021-01-30 DIAGNOSIS — Z803 Family history of malignant neoplasm of breast: Secondary | ICD-10-CM | POA: Diagnosis not present

## 2021-01-30 DIAGNOSIS — D0512 Intraductal carcinoma in situ of left breast: Secondary | ICD-10-CM | POA: Diagnosis not present

## 2021-01-30 DIAGNOSIS — N6022 Fibroadenosis of left breast: Secondary | ICD-10-CM | POA: Insufficient documentation

## 2021-01-30 DIAGNOSIS — E785 Hyperlipidemia, unspecified: Secondary | ICD-10-CM | POA: Diagnosis not present

## 2021-01-30 DIAGNOSIS — E039 Hypothyroidism, unspecified: Secondary | ICD-10-CM | POA: Diagnosis not present

## 2021-01-30 DIAGNOSIS — Z170 Estrogen receptor positive status [ER+]: Secondary | ICD-10-CM | POA: Diagnosis not present

## 2021-01-30 DIAGNOSIS — K5900 Constipation, unspecified: Secondary | ICD-10-CM | POA: Insufficient documentation

## 2021-01-30 DIAGNOSIS — M2550 Pain in unspecified joint: Secondary | ICD-10-CM | POA: Insufficient documentation

## 2021-01-30 DIAGNOSIS — Z79899 Other long term (current) drug therapy: Secondary | ICD-10-CM | POA: Insufficient documentation

## 2021-01-30 DIAGNOSIS — Z87891 Personal history of nicotine dependence: Secondary | ICD-10-CM | POA: Insufficient documentation

## 2021-01-30 HISTORY — DX: Anogenital herpesviral infection, unspecified: A60.9

## 2021-01-30 NOTE — Progress Notes (Signed)
Radiation Oncology         ([phone]) [phone] ________________________________  Name: Tara Yates        MRN: [id number]  Date of Service: 01/30/2021 DOB: Aug 09, 1965  XB:[id number], Langley Adie, MD  Rolm Bookbinder, MD     REFERRING PHYSICIAN: Rolm Bookbinder, MD   DIAGNOSIS: The encounter diagnosis was Ductal carcinoma in situ of left breast.   HISTORY OF PRESENT ILLNESS: Tara Yates is a 55 y.o. female with a  left breast cancer.  The patient was seen for screening mammography in March 2022, but there was a possible distortion seen in the left breast.  Further diagnostic work-up showed no suspicious mass by ultrasound and her left axilla was negative.  Diagnostic mammogram however showed possible subtle distortion in the upper outer quadrant of the left breast and a stereotactic biopsy was obtained on 10/16/2020 showing intermediate grade DCIS that was ER/PR positive.  She was counseled on lumpectomy and underwent left lumpectomy on 12/18/2020 which showed DCIS intermediate grade involving areas of sclerosing adenosis.  Her margins were all negative for DCIS the closest margin being 3 mm, the overall size of the malignant component was not able to be determined, additional lateral and posterior margins were negative for disease.  She's seen today to review adjuvant radiotherapy. She's met with Dr. Burr Medico, and plans to have a bone density test prior to deciding if she'll take antiestrogen therapy.   PREVIOUS RADIATION THERAPY: No   PAST MEDICAL HISTORY:  Past Medical History:  Diagnosis Date   Allergy    Anxiety    Back pain    Constipation    Depression    Ductal carcinoma in situ of left breast 11/09/2020   Family history of breast cancer 11/09/2020   HSV (herpes simplex virus) anogenital infection    Hyperlipidemia    Hypothyroidism    Joint pain    Plantar fasciitis, bilateral    SOBOE (shortness of breath on exertion)        PAST SURGICAL HISTORY: Past Surgical  History:  Procedure Laterality Date   BREAST BIOPSY Right 02/28/2015   BREAST EXCISIONAL BIOPSY Right 03/29/2015   had seed placement as wel   BREAST LUMPECTOMY WITH RADIOACTIVE SEED LOCALIZATION Left 12/18/2020   Procedure: LEFT BREAST LUMPECTOMY WITH RADIOACTIVE SEED LOCALIZATION;  Surgeon: Rolm Bookbinder, MD;  Location: [facility];  Service: General;  Laterality: Left;   ENDOMETRIAL ABLATION     MOUTH SURGERY     RADIOACTIVE SEED GUIDED EXCISIONAL BREAST BIOPSY Right 04/03/2015   Procedure: RADIOACTIVE SEED GUIDED EXCISIONAL BREAST BIOPSY;  Surgeon: Rolm Bookbinder, MD;  Location: [facility];  Service: General;  Laterality: Right;     FAMILY HISTORY:  Family History  Problem Relation Age of Onset   Heart disease Mother    Heart failure Mother    Heart attack Mother    Hyperlipidemia Mother    Sudden death Mother    Heart disease Father    Hyperlipidemia Father    Hypertension Father    Thyroid disease Father    Sleep apnea Father    Obesity Father    Breast cancer Paternal Grandmother        dx 62s, d. 42   Breast cancer Other        PGM's sisters, x2, dx unknown age   Colon cancer Neg Hx    Esophageal cancer Neg Hx    Rectal cancer Neg Hx    Stomach cancer Neg Hx  SOCIAL HISTORY:  reports that she quit smoking about 22 years ago. Her smoking use included cigarettes. She has a 29.00 pack-year smoking history. She has never used smokeless tobacco. She reports current alcohol use of about 7.0 standard drinks of alcohol per week. She reports that she does not use drugs. The patient is divorced but in a relationship with Mr. Candida Peeling who accompanies her. She has a teenage daughter in high school and a son in college. 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   ALLERGIES: Patient has no known allergies.   MEDICATIONS:  Current Outpatient Medications  Medication Sig Dispense Refill   acidophilus  (RISAQUAD) CAPS capsule Take by mouth daily.     acyclovir ointment (ZOVIRAX) 5 % Apply 1 application topically every 3 (three) hours.     b complex vitamins capsule Take 1 capsule by mouth daily.     Biotin 10000 MCG TABS Take by mouth.     cholecalciferol (VITAMIN D3) 25 MCG (1000 UNIT) tablet Take 1,000 Units by mouth daily.     Collagen-Vitamin C-Biotin (COLLAGEN 1500/C PO) Take by mouth.     escitalopram (LEXAPRO) 20 MG tablet Take 20 mg by mouth daily.     levothyroxine (SYNTHROID) 25 MCG tablet TAKE 1 TABLET(25 MCG) BY MOUTH DAILY BEFORE AND BREAKFAST 90 tablet 1   Loratadine 10 MG CHEW Chew 10 mg by mouth daily as needed.     polyethylene glycol (MIRALAX / GLYCOLAX) 17 g packet Take 17 g by mouth daily.     Turmeric 500 MG TABS Take by mouth.     valACYclovir (VALTREX) 1000 MG tablet Take 1,000 mg by mouth 2 (two) times daily.     vitamin E 1000 UNIT capsule Take 1,000 Units by mouth daily.     No current facility-administered medications for this encounter.     REVIEW OF SYSTEMS: On review of systems, the patient reports that she is doing well from a breast standpoint. She's had added stress of a diagnosis of HSV infection, and the stress of her father having a hip fracture. He goes for surgery and placement for his safety and healing is to be determined. She's also noticed hair thinning and is going to be discussing this further with her PCP. No other concerns or complaints are verbalized.      PHYSICAL EXAM:  Wt Readings from Last 3 Encounters:  01/30/21 164 lb 2 oz (74.4 kg)  12/27/20 166 lb 11.2 oz (75.6 kg)  12/25/20 165 lb 3.2 oz (74.9 kg)   Temp Readings from Last 3 Encounters:  01/30/21 97.7 F (36.5 C)  01/06/21 98.5 F (36.9 C)  12/27/20 98.4 F (36.9 C) (Oral)   BP Readings from Last 3 Encounters:  01/30/21 127/74  01/06/21 122/67  12/27/20 119/73   Pulse Readings from Last 3 Encounters:  01/30/21 68  01/06/21 (!) 57  12/27/20 67   Pain  Assessment Pain Score: 0-No pain/10  In general this is a well appearing caucasian female in no acute distress. She's alert and oriented x4 and appropriate throughout the examination. Cardiopulmonary assessment is negative for acute distress and she exhibits normal effort. Breast examination is deferred.    ECOG = 0  0 - Asymptomatic (Fully active, able to carry on all predisease activities without restriction)  1 - Symptomatic but completely ambulatory (Restricted in physically strenuous activity but ambulatory and able to carry out work of a light or sedentary nature. For example, light housework, office work)  2 - Symptomatic, <50% in bed during the day (Ambulatory and capable of all self care but unable to carry out any work activities. Up and about more than 50% of waking hours)  3 - Symptomatic, >50% in bed, but not bedbound (Capable of only limited self-care, confined to bed or chair 50% or more of waking hours)  4 - Bedbound (Completely disabled. Cannot carry on any self-care. Totally confined to bed or chair)  5 - Death   Eustace Pen MM, Creech RH, Tormey DC, et al. [phone]). "Toxicity and response criteria of the [facility]". [place] Oncol. 5 (6): 649-55    LABORATORY DATA:  Lab Results  Component Value Date   WBC 4.3 08/17/2020   HGB 13.4 08/17/2020   HCT 39.4 08/17/2020   MCV 90.3 08/17/2020   PLT 223.0 08/17/2020   Lab Results  Component Value Date   NA 142 08/17/2020   K 4.1 08/17/2020   CL 102 08/17/2020   CO2 29 08/17/2020   Lab Results  Component Value Date   ALT 27 05/15/2020   AST 16 05/15/2020   ALKPHOS 93 05/15/2020   BILITOT 0.4 05/15/2020      RADIOGRAPHY: No results found.     IMPRESSION/PLAN: 1. ER/PR positive Intermediate grade DCIS of the left breast. Dr. Lisbeth Renshaw has reviewed her final pathology findings and I reviewed the nature of noninvasive breast disease.  We reviewed the [facility] DCIS risk  assessment Nomogram that I printed for her with multiple scenarios of adjuvant therapy. We reviewed the rationale external radiotherapy to the breast  to reduce risks of local recurrence followed by antiestrogen therapy. We discussed the risks, benefits, short, and long term effects of radiotherapy, as well as the curative intent, and the patient is interested in moving forward with treatment with Dr. Lisbeth Renshaw. I've discussed the delivery and logistics of radiotherapy and Dr. Ida Rogue recommendation for 4 weeks of radiotherapy with deep inspiration breath hold technique. She will come in for simulation next week Wednesday. Written consent is obtained and placed in the chart, a copy was provided to the patient.    In a visit lasting 45 minutes, greater than 50% of the time was spent face to face discussing the patient's condition, in preparation for the discussion, and coordinating the patient's care.      Carola Rhine, [facility]   **Disclaimer: This note was dictated with voice recognition software. Similar sounding words can inadvertently be transcribed and this note may contain transcription errors which may not have been corrected upon publication of note.**

## 2021-01-31 ENCOUNTER — Encounter: Payer: Self-pay | Admitting: *Deleted

## 2021-02-05 ENCOUNTER — Encounter: Payer: Self-pay | Admitting: *Deleted

## 2021-02-05 ENCOUNTER — Telehealth: Payer: Self-pay | Admitting: *Deleted

## 2021-02-05 NOTE — Telephone Encounter (Signed)
Received a message from Levada Dy in clerical stating Tara Yates called and wanted to cancel her [place] appointment on 02/06/2021.  She also states Tara Yates does not want any treatments.  Simulation appointment was cancelled and MD and PA made aware.    Gloriajean Dell. Leonie Green, BSN

## 2021-02-06 ENCOUNTER — Ambulatory Visit: Payer: 59 | Admitting: Radiation Oncology

## 2021-02-07 ENCOUNTER — Ambulatory Visit
Admission: RE | Admit: 2021-02-07 | Discharge: 2021-02-07 | Disposition: A | Discharge status: Home / Self Care | Payer: 59 | Source: Ambulatory Visit | Attending: Hematology | Admitting: Hematology

## 2021-02-07 ENCOUNTER — Other Ambulatory Visit: Payer: Self-pay

## 2021-02-07 DIAGNOSIS — E2839 Other primary ovarian failure: Secondary | ICD-10-CM

## 2021-02-13 ENCOUNTER — Other Ambulatory Visit: Payer: Self-pay | Admitting: Adult Health

## 2021-02-13 ENCOUNTER — Ambulatory Visit: Payer: 59 | Admitting: Radiation Oncology

## 2021-02-13 DIAGNOSIS — D0512 Intraductal carcinoma in situ of left breast: Secondary | ICD-10-CM

## 2021-02-14 ENCOUNTER — Ambulatory Visit: Payer: 59

## 2021-02-15 ENCOUNTER — Telehealth: Payer: Self-pay

## 2021-02-15 ENCOUNTER — Ambulatory Visit: Payer: 59

## 2021-02-15 NOTE — Telephone Encounter (Signed)
-----   Message from Alla Feeling, NP sent at 02/12/2021  2:50 PM EDT ----- Please let her know DEXA shows mild osteopenia, but fracture risk is 17% which is high. I recommend to continue vitamin D and add daily calcium.   She declined adjuvant radiation, please see if she agrees to anti-estrogen pill? If she is interested I will call her.   Thanks, Regan Rakers, NP

## 2021-02-15 NOTE — Telephone Encounter (Signed)
Patient made aware of  Dexa scan results and your recommendations per Cira Rue, NP.  She states that she has added Calcium and is still taking her Vit. D.  She denies the anti estrogen pill she states that she does not want to do anything for treatment other than diet modification and supplements.  No further questions or concerns at this time.  Patient knows to call if there are any questions, concerns or problems.

## 2021-02-18 ENCOUNTER — Ambulatory Visit: Payer: 59

## 2021-02-19 ENCOUNTER — Ambulatory Visit: Payer: 59

## 2021-02-20 ENCOUNTER — Ambulatory Visit: Payer: 59

## 2021-02-21 ENCOUNTER — Ambulatory Visit: Payer: 59

## 2021-02-22 ENCOUNTER — Ambulatory Visit: Payer: 59

## 2021-02-25 ENCOUNTER — Ambulatory Visit: Payer: 59

## 2021-02-26 ENCOUNTER — Ambulatory Visit: Payer: 59

## 2021-02-27 ENCOUNTER — Ambulatory Visit: Payer: 59

## 2021-02-28 ENCOUNTER — Ambulatory Visit: Payer: 59

## 2021-03-01 ENCOUNTER — Ambulatory Visit: Payer: 59

## 2021-03-04 ENCOUNTER — Ambulatory Visit: Payer: 59

## 2021-03-05 ENCOUNTER — Ambulatory Visit: Payer: 59

## 2021-03-06 ENCOUNTER — Ambulatory Visit: Payer: 59

## 2021-04-01 ENCOUNTER — Telehealth: Payer: Self-pay | Admitting: Family Medicine

## 2021-04-01 NOTE — Telephone Encounter (Signed)
PT called to request to get her thyroid check. Saw no orders on file and PT would like to get some place and scheduled as she feels her thyroid is out of wack.

## 2021-04-03 NOTE — Telephone Encounter (Signed)
Left vm advising pt that no orders were placed due to last labs were normal and improving, no orders could be placed unless sees pcp. Informed her to call office to schedule an appointment to discuss with pcp.

## 2021-04-06 ENCOUNTER — Other Ambulatory Visit: Payer: Self-pay | Admitting: Family Medicine

## 2021-04-06 DIAGNOSIS — F3289 Other specified depressive episodes: Secondary | ICD-10-CM

## 2021-04-11 ENCOUNTER — Other Ambulatory Visit: Payer: Self-pay | Admitting: Family Medicine

## 2021-04-11 DIAGNOSIS — R922 Inconclusive mammogram: Secondary | ICD-10-CM

## 2021-04-11 DIAGNOSIS — C50919 Malignant neoplasm of unspecified site of unspecified female breast: Secondary | ICD-10-CM

## 2021-04-17 ENCOUNTER — Telehealth: Payer: 59 | Admitting: Family Medicine

## 2021-04-17 ENCOUNTER — Encounter: Payer: Self-pay | Admitting: Family Medicine

## 2021-04-17 DIAGNOSIS — F330 Major depressive disorder, recurrent, mild: Secondary | ICD-10-CM | POA: Diagnosis not present

## 2021-04-17 DIAGNOSIS — D0512 Intraductal carcinoma in situ of left breast: Secondary | ICD-10-CM

## 2021-04-17 DIAGNOSIS — F419 Anxiety disorder, unspecified: Secondary | ICD-10-CM

## 2021-04-17 MED ORDER — SERTRALINE HCL 50 MG PO TABS: 50.0000 mg | ORAL_TABLET | Freq: Every day | ORAL | 1 refills | Status: DC

## 2021-04-17 NOTE — Progress Notes (Signed)
Virtual Visit via Video Note  I connected with Tara Yates on 04/17/21 at  4:30 PM EDT by a video enabled telemedicine application 2/2 [id number] pandemic and verified that I am speaking with the correct person using two identifiers.  Location patient: home Location provider:work or home office Persons participating in the virtual visit: patient, provider  I discussed the limitations of evaluation and management by telemedicine and the availability of in person appointments. The patient expressed understanding and agreed to proceed.   HPI: Pt is a 55 yo female with pmh sig for anxiety, depression, HLD, hypothyroidism, HSV, ER/PR positive DCIS L breast, who is seen for f/u and med refills.    Pt has not had f/u with Oncology s/p lumpectomy 12/18/20 for DCIS of L breast.  Pt states she was advised to have XRT but it was around the time her father fell.  Pt's father, who is in his 32s broke his hip.  She was dealing with finding placement for him and selling his house.  Sertraline 50 mg helping.  Pt would like to continue this dose.  Some increased stress working on a grant.  Notes sleep is good, but wakes in the middle of the night.  Thinks it is due to menopause.  Working out with a Physiological scientist.    ROS: See pertinent positives and negatives per HPI.  Past Medical History:  Diagnosis Date   Allergy    Anxiety    Back pain    Constipation    Depression    Ductal carcinoma in situ of left breast 11/09/2020   Family history of breast cancer 11/09/2020   HSV (herpes simplex virus) anogenital infection    Hyperlipidemia    Hypothyroidism    Joint pain    Plantar fasciitis, bilateral    SOBOE (shortness of breath on exertion)     Past Surgical History:  Procedure Laterality Date   BREAST BIOPSY Right 02/28/2015   BREAST EXCISIONAL BIOPSY Right 03/29/2015   had seed placement as wel   BREAST LUMPECTOMY WITH RADIOACTIVE SEED LOCALIZATION Left 12/18/2020   Procedure: LEFT BREAST  LUMPECTOMY WITH RADIOACTIVE SEED LOCALIZATION;  Surgeon: Rolm Bookbinder, MD;  Location: [facility];  Service: General;  Laterality: Left;   ENDOMETRIAL ABLATION     MOUTH SURGERY     RADIOACTIVE SEED GUIDED EXCISIONAL BREAST BIOPSY Right 04/03/2015   Procedure: RADIOACTIVE SEED GUIDED EXCISIONAL BREAST BIOPSY;  Surgeon: Rolm Bookbinder, MD;  Location: [facility];  Service: General;  Laterality: Right;    Family History  Problem Relation Age of Onset   Heart disease Mother    Heart failure Mother    Heart attack Mother    Hyperlipidemia Mother    Sudden death Mother    Heart disease Father    Hyperlipidemia Father    Hypertension Father    Thyroid disease Father    Sleep apnea Father    Obesity Father    Breast cancer Paternal Grandmother        dx 14s, d. 18   Breast cancer Other        PGM's sisters, x2, dx unknown age   Colon cancer Neg Hx    Esophageal cancer Neg Hx    Rectal cancer Neg Hx    Stomach cancer Neg Hx      Current Outpatient Medications:    acidophilus (RISAQUAD) CAPS capsule, Take by mouth daily., Disp: , Rfl:    acyclovir ointment (ZOVIRAX) 5 %, Apply 1 application  topically every 3 (three) hours., Disp: , Rfl:    b complex vitamins capsule, Take 1 capsule by mouth daily., Disp: , Rfl:    Biotin 10000 MCG TABS, Take by mouth., Disp: , Rfl:    cholecalciferol (VITAMIN D3) 25 MCG (1000 UNIT) tablet, Take 1,000 Units by mouth daily., Disp: , Rfl:    Collagen-Vitamin C-Biotin (COLLAGEN 1500/C PO), Take by mouth., Disp: , Rfl:    levothyroxine (SYNTHROID) 25 MCG tablet, TAKE 1 TABLET(25 MCG) BY MOUTH DAILY BEFORE AND BREAKFAST, Disp: 90 tablet, Rfl: 1   Loratadine 10 MG CHEW, Chew 10 mg by mouth daily as needed., Disp: , Rfl:    polyethylene glycol (MIRALAX / GLYCOLAX) 17 g packet, Take 17 g by mouth daily., Disp: , Rfl:    Turmeric 500 MG TABS, Take by mouth., Disp: , Rfl:    valACYclovir (VALTREX) 1000 MG tablet, Take 1,000  mg by mouth 2 (two) times daily., Disp: , Rfl:    vitamin E 1000 UNIT capsule, Take 1,000 Units by mouth daily., Disp: , Rfl:    escitalopram (LEXAPRO) 20 MG tablet, Take 20 mg by mouth daily., Disp: , Rfl:   EXAM:  VITALS per patient if applicable: RR between 25-85 bpm  GENERAL: alert, oriented, appears well and in no acute distress  HEENT: atraumatic, conjunctiva clear, no obvious abnormalities on inspection of external nose and ears  NECK: normal movements of the head and neck  LUNGS: on inspection no signs of respiratory distress, breathing rate appears normal, no obvious gross SOB, gasping or wheezing  CV: no obvious cyanosis  MS: moves all visible extremities without noticeable abnormality  PSYCH/NEURO: pleasant and cooperative, no obvious depression or anxiety, speech and thought processing grossly intact  GAD 7 : Generalized Anxiety Score 04/17/2021 08/17/2020  Nervous, Anxious, on Edge 0 1  Control/stop worrying 0 2  Worry too much - different things 1 2  Trouble relaxing 0 0  Restless 0 0  Easily annoyed or irritable 0 0  Afraid - awful might happen 0 2  Total GAD 7 Score 1 7    PHQ9 SCORE ONLY 04/17/2021 08/17/2020 05/15/2020  PHQ-9 Total Score 2 11 18     ASSESSMENT AND PLAN:  Discussed the following assessment and plan:  Anxiety -stable  -GAD7 score 1 -continue zoloft 50 mg daily - Plan: sertraline (ZOLOFT) 50 MG tablet  Mild episode of recurrent major depressive disorder (HCC)  -Stable -PHQ 9 score 2 -continue zoloft 50 mg daily -contineu self care including exercise - Plan: sertraline (ZOLOFT) 50 MG tablet  DCIS of Left breast -s/p lumpectomy -ER/PR positive intermediate grade DCIS of left breast. -pt encouraged to f/u with oncology to further discuss treatment options  F/u in 3-4 months, sooner if needed.   I discussed the assessment and treatment plan with the patient. The patient was provided an opportunity to ask questions and all were  answered. The patient agreed with the plan and demonstrated an understanding of the instructions.   The patient was advised to call back or seek an in-person evaluation if the symptoms worsen or if the condition fails to improve as anticipated.   Billie Ruddy, MD

## 2021-04-23 ENCOUNTER — Other Ambulatory Visit: Payer: Self-pay | Admitting: Family Medicine

## 2021-04-23 ENCOUNTER — Ambulatory Visit
Admission: RE | Admit: 2021-04-23 | Discharge: 2021-04-23 | Disposition: A | Discharge status: Home / Self Care | Payer: 59 | Source: Ambulatory Visit | Attending: Family Medicine | Admitting: Family Medicine

## 2021-04-23 ENCOUNTER — Other Ambulatory Visit: Payer: Self-pay

## 2021-04-23 DIAGNOSIS — C50919 Malignant neoplasm of unspecified site of unspecified female breast: Secondary | ICD-10-CM

## 2021-04-23 DIAGNOSIS — R9389 Abnormal findings on diagnostic imaging of other specified body structures: Secondary | ICD-10-CM

## 2021-04-23 DIAGNOSIS — R922 Inconclusive mammogram: Secondary | ICD-10-CM

## 2021-04-23 MED ORDER — GADOBUTROL 1 MMOL/ML IV SOLN
8.0000 mL | Freq: Once | INTRAVENOUS | Status: AC | PRN
  Administered 2021-04-23: 8 mL via INTRAVENOUS

## 2021-04-24 ENCOUNTER — Telehealth: Payer: Self-pay | Admitting: Family Medicine

## 2021-04-24 NOTE — Telephone Encounter (Signed)
Patient called to have Dr.Banks review her Breast MRI results and have a referral sent over to [facility].       Good callback number is [phone]      Please Advise

## 2021-04-25 NOTE — Telephone Encounter (Signed)
Attempted to contact pt in regards to recent MRI results, but no answer.  VM left.  Will try again.

## 2021-04-29 ENCOUNTER — Ambulatory Visit
Admission: RE | Admit: 2021-04-29 | Discharge: 2021-04-29 | Disposition: A | Discharge status: Home / Self Care | Payer: 59 | Source: Ambulatory Visit | Attending: Family Medicine | Admitting: Family Medicine

## 2021-04-29 ENCOUNTER — Other Ambulatory Visit: Payer: Self-pay | Admitting: Diagnostic Radiology

## 2021-04-29 ENCOUNTER — Other Ambulatory Visit: Payer: Self-pay

## 2021-04-29 DIAGNOSIS — R9389 Abnormal findings on diagnostic imaging of other specified body structures: Secondary | ICD-10-CM

## 2021-04-29 IMAGING — MR MR BREAST BX W LOC DEV 1ST LESION IMAGE BX SPEC MR GUIDE*L*
6 of 8 series · 31 of 48 positions shown · IV contrast (7 ml gadavist)
Comparison: Previous exams.
COMPARISON: Previous exams.

Addendum:
CLINICAL DATA: 54-year-old female with suspicious left breast non
mass enhancement.

EXAM:
MRI GUIDED CORE NEEDLE BIOPSY OF THE LEFT BREAST
TECHNIQUE: Multiplanar, multisequence MR imaging of the left breast was
performed both before and after administration of intravenous
contrast.
CONTRAST:  7mL GADAVIST GADOBUTROL 1 MMOL/ML IV SOLN

[Series 2: fiducial unilateral · sagittal · 2.0mm · 1.33mm/px · 1 of 52 slices shown]
[im 1/52]
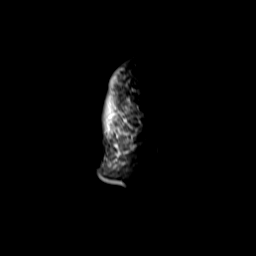

[Series 3: dynamic pre · axial · non-contrast · 1.3mm · 0.73mm/px · z∈[-107,+121]mm · 6 of 176 slices shown]
[im 1/176]
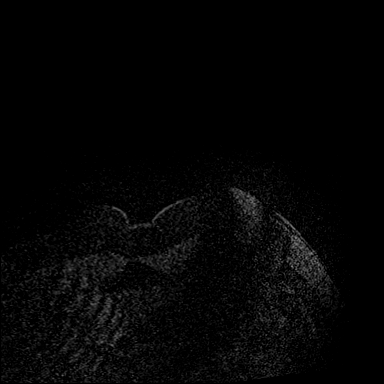
[im 36/176]
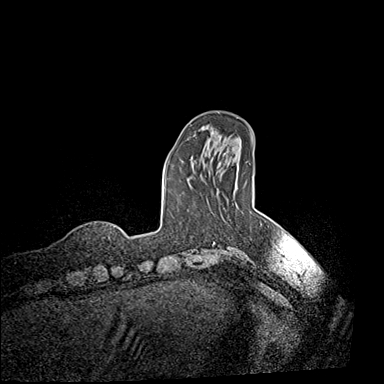
[im 71/176]
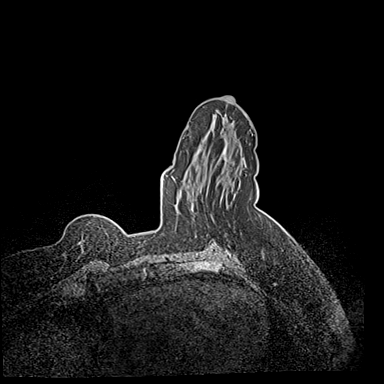
[im 106/176]
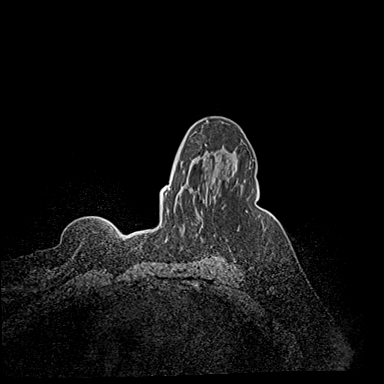
[im 141/176]
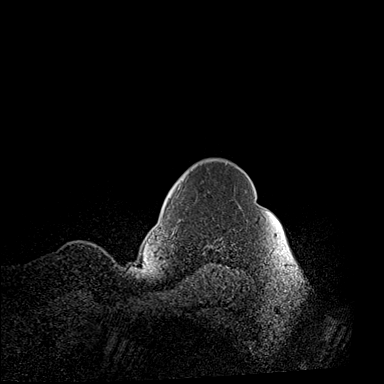
[im 176/176]
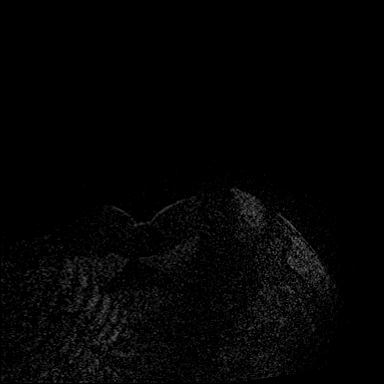

[Series 4: dynamic post 20 · axial · 1.3mm · 0.73mm/px · z∈[-107,+121]mm · 6 of 176 slices shown (1 of 2)]
[im 1/176]
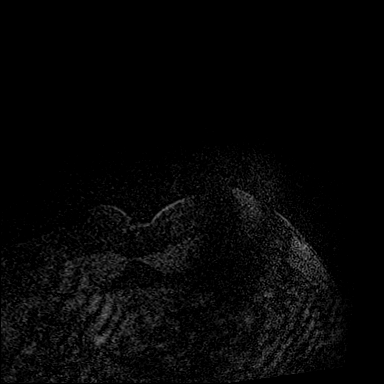
[im 36/176]
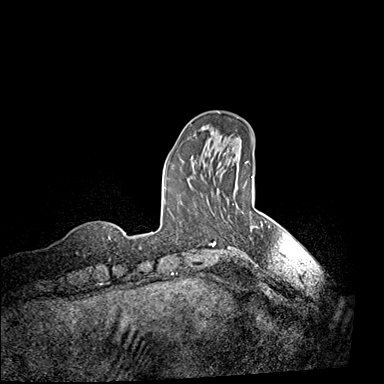
[im 71/176]
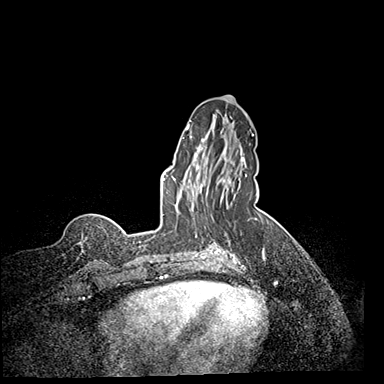
[im 106/176]
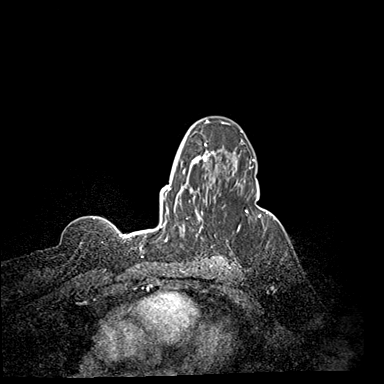
[im 141/176]
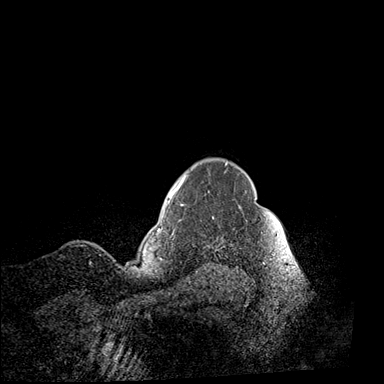
[im 176/176]
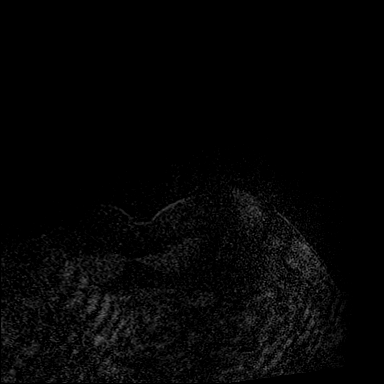

[Series 5: dynamic post 20 · axial · 1.3mm · 0.73mm/px · z∈[-107,+121]mm · 7 of 176 slices shown (2 of 2)]
[im 1/176]
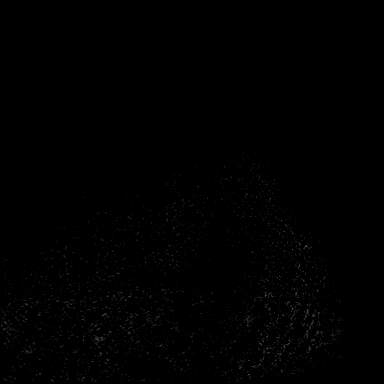
[im 30/176]
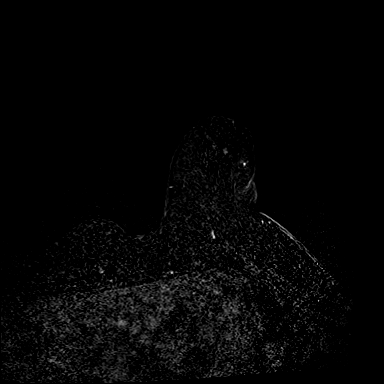
[im 59/176]
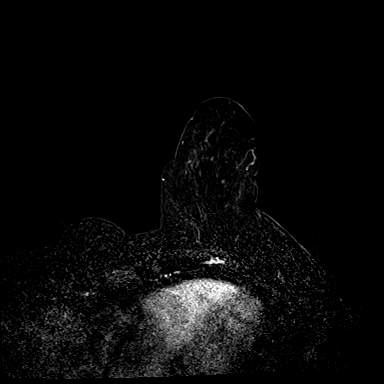
[im 88/176]
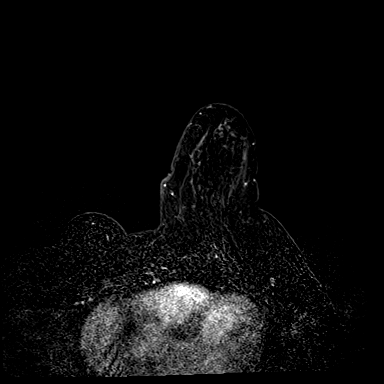
[im 117/176]
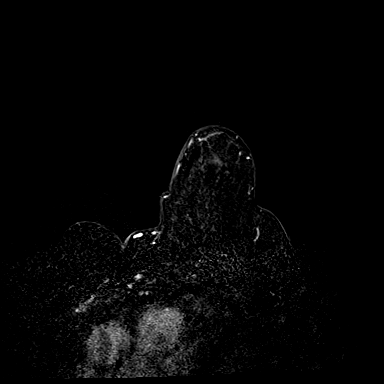
[im 146/176]
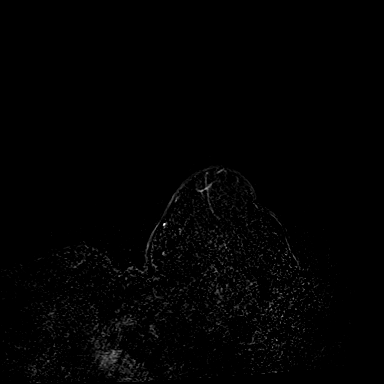
[im 176/176]
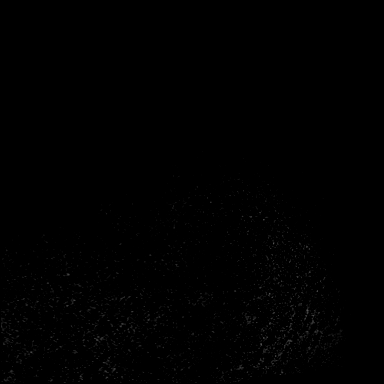

[Series 6: dynamic post 3 · axial · 1.3mm · 0.73mm/px · z∈[-107,+121]mm · 7 of 176 slices shown (1 of 2)]
[im 1/176]
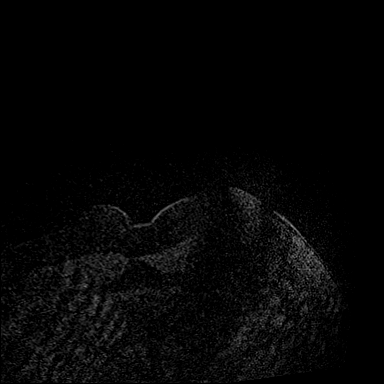
[im 30/176]
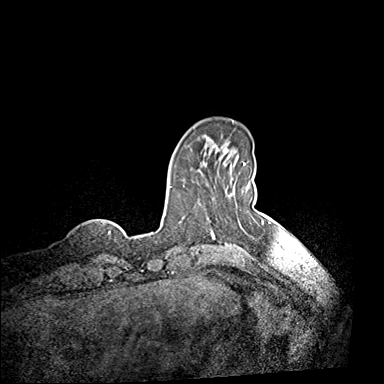
[im 59/176]
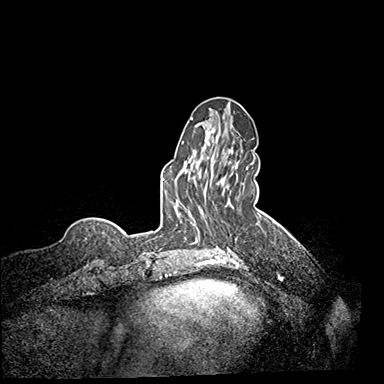
[im 88/176]
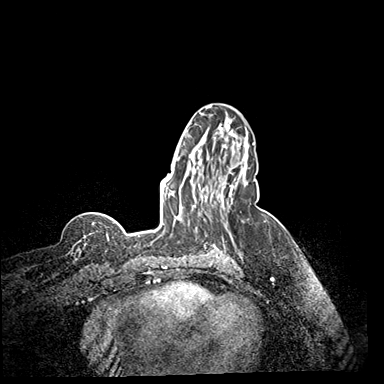
[im 117/176]
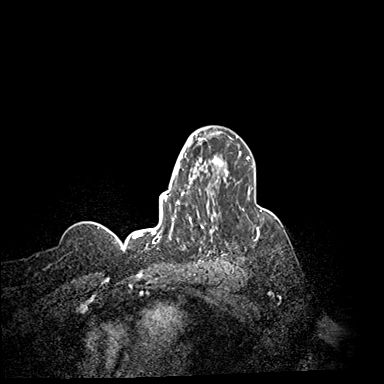
[im 146/176]
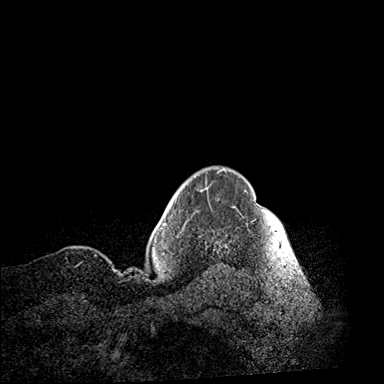
[im 176/176]
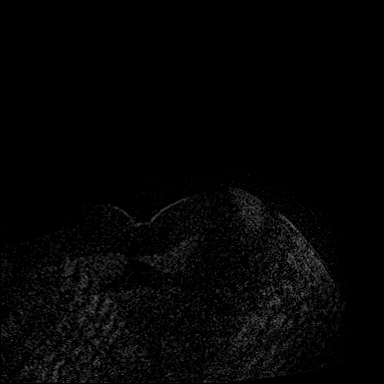

[Series 7: dynamic post 3 · axial · 1.3mm · 0.73mm/px · z∈[-107,+7]mm · 4 of 176 slices shown (2 of 2)]
[im 1/176]
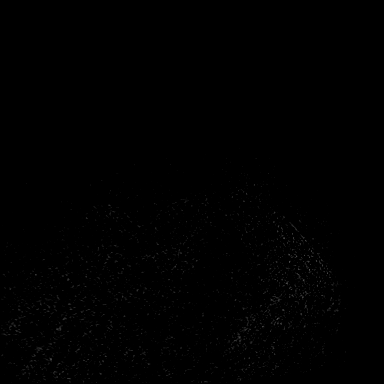
[im 30/176]
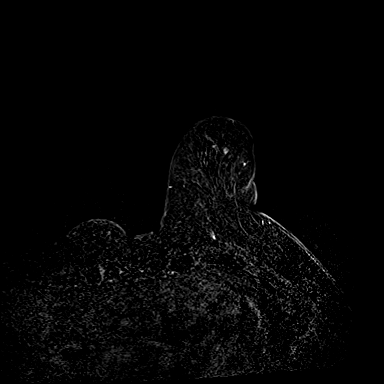
[im 59/176]
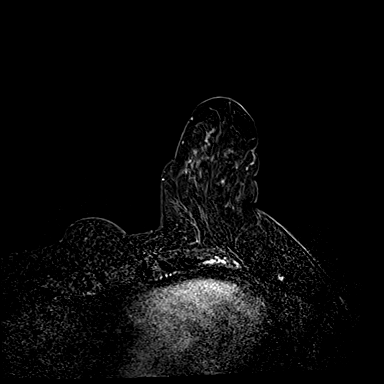
[im 88/176]
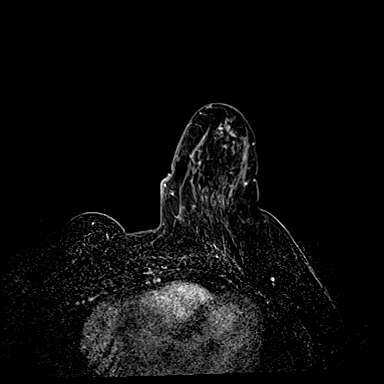

[31 of 48 positions shown; findings below may reference images not displayed]

FINDINGS: I met with the patient, and we discussed the procedure of MRI guided
biopsy, including risks, benefits, and alternatives. Specifically,
we discussed the risks of infection, bleeding, tissue injury, clip
migration, and inadequate sampling. Informed, written consent was
given. The usual time out protocol was performed immediately prior
to the procedure.

Using sterile technique, 1% Lidocaine, MRI guidance, and a 9 gauge
vacuum assisted device, biopsy was attempted of left breast non mass
enhancement using a lateral approach. After a single core biopsy
pass was taken, the vacuum assisted biopsy device malfunctioned.
Additional samples could not be obtained and we were forced to
prematurely cancel the biopsy. A cylinder shaped tissue marker clip
was deployed into the biopsy cavity. Follow-up 2-view mammogram was
performed and dictated separately.
IMPRESSION: Attempted MRI guided biopsy of left breast non-mass enhancement. The
vacuum assisted biopsy device malfunction after a single core biopsy
pass was taken. If pathology results are inconclusive or demonstrate
benign pathology, repeat biopsy is recommended. This should be
performed at no charge to the patient.

ADDENDUM:
Pathology revealed LOBULAR NEOPLASIA (ATYPICAL LOBULAR HYPERPLASIA)
INVOLVING A SMALL SCLEROTIC LESION of the LEFT breast, upper inner
quadrant, anterior, (cylinder clip). This was found to be concordant
by Dr. ZUNIHARWATI, with excision recommended.

Pathology results were discussed with the patient by telephone. The
patient reported doing well after the biopsy with tenderness at the
site. Post biopsy instructions and care were reviewed and questions
were answered. The patient was encouraged to call The [REDACTED] for any additional concerns. My direct phone
number was provided.

Surgical consultation has been arranged with Dr. ZUNIHARWATI
at [REDACTED] on [DATE].

Pathology results reported by ZUNIHARWATI, RN on [DATE].

*** End of Addendum ***
FINDINGS: I met with the patient, and we discussed the procedure of MRI guided
biopsy, including risks, benefits, and alternatives. Specifically,
we discussed the risks of infection, bleeding, tissue injury, clip
migration, and inadequate sampling. Informed, written consent was
given. The usual time out protocol was performed immediately prior
to the procedure.

Using sterile technique, 1% Lidocaine, MRI guidance, and a 9 gauge
vacuum assisted device, biopsy was attempted of left breast non mass
enhancement using a lateral approach. After a single core biopsy
pass was taken, the vacuum assisted biopsy device malfunctioned.
Additional samples could not be obtained and we were forced to
prematurely cancel the biopsy. A cylinder shaped tissue marker clip
was deployed into the biopsy cavity. Follow-up 2-view mammogram was
performed and dictated separately.
IMPRESSION: Attempted MRI guided biopsy of left breast non-mass enhancement. The
vacuum assisted biopsy device malfunction after a single core biopsy
pass was taken. If pathology results are inconclusive or demonstrate
benign pathology, repeat biopsy is recommended. This should be
performed at no charge to the patient.

## 2021-04-29 IMAGING — MG MM BREAST LOCALIZATION CLIP
4 series · 4 of 12 positions shown · non-contrast
Comparison: Previous exam(s).

CLINICAL DATA: Attempted left breast MRI guided biopsy.

EXAM:
3D DIAGNOSTIC LEFT MAMMOGRAM POST MRI BIOPSY

[L ML synth-2D]
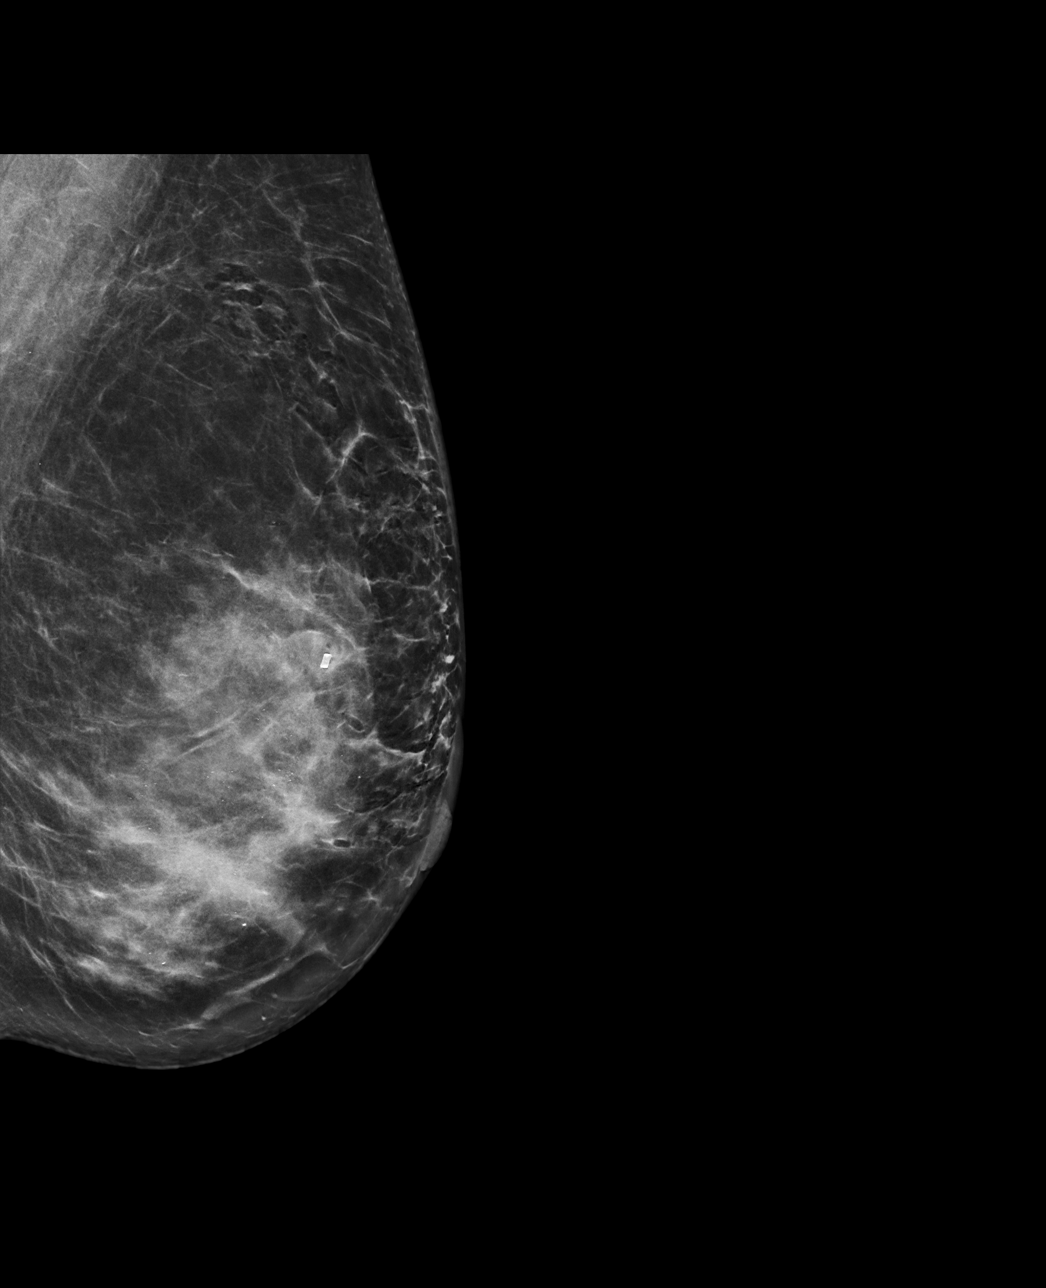

[L CC synth-2D]
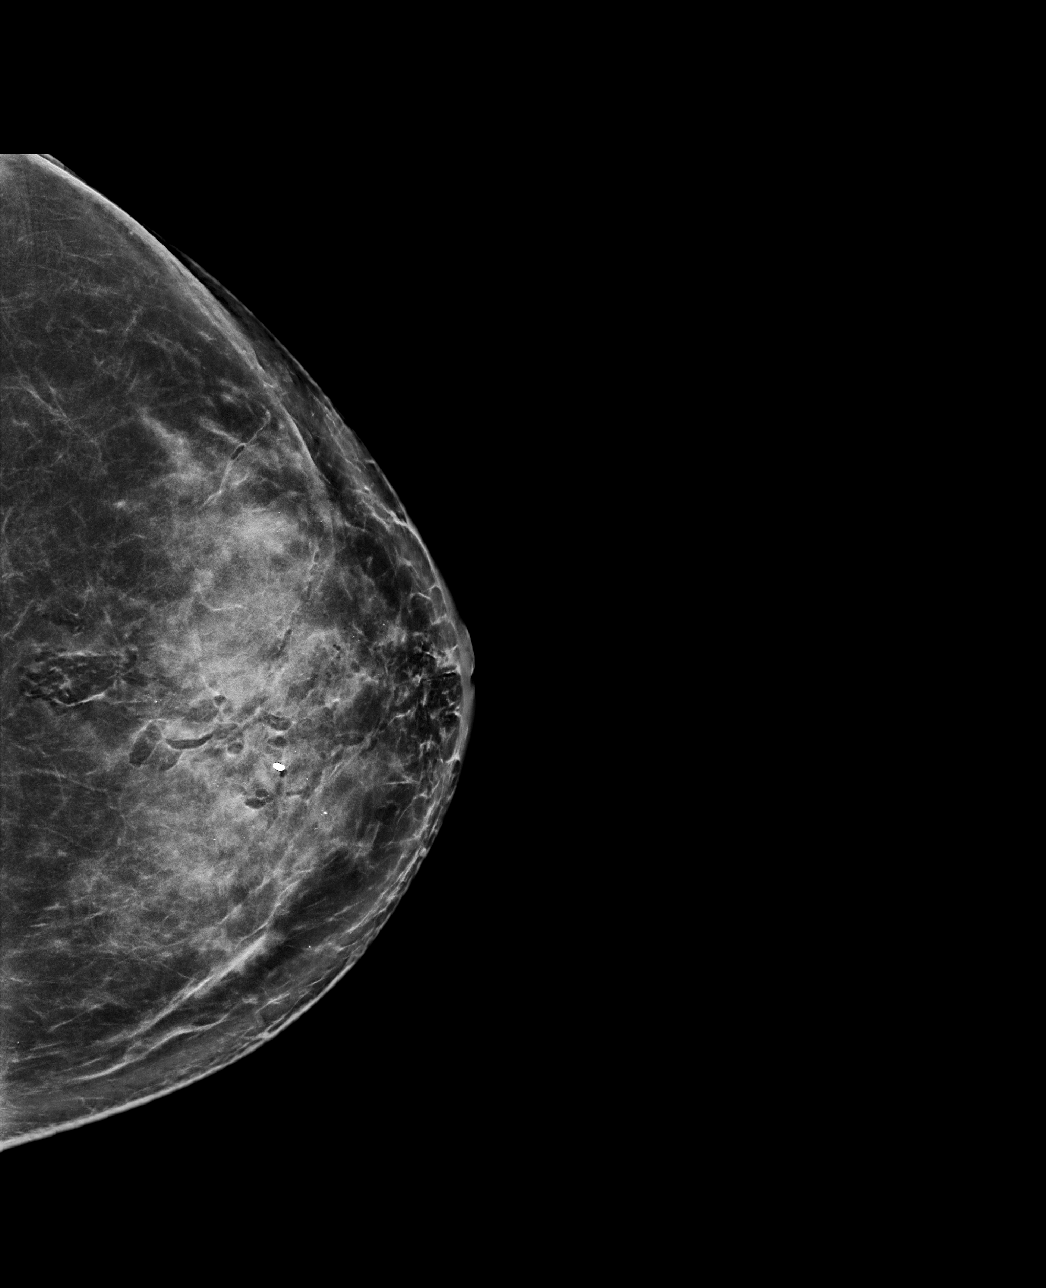

[L CC tomo · tomo slice 45/90.0]
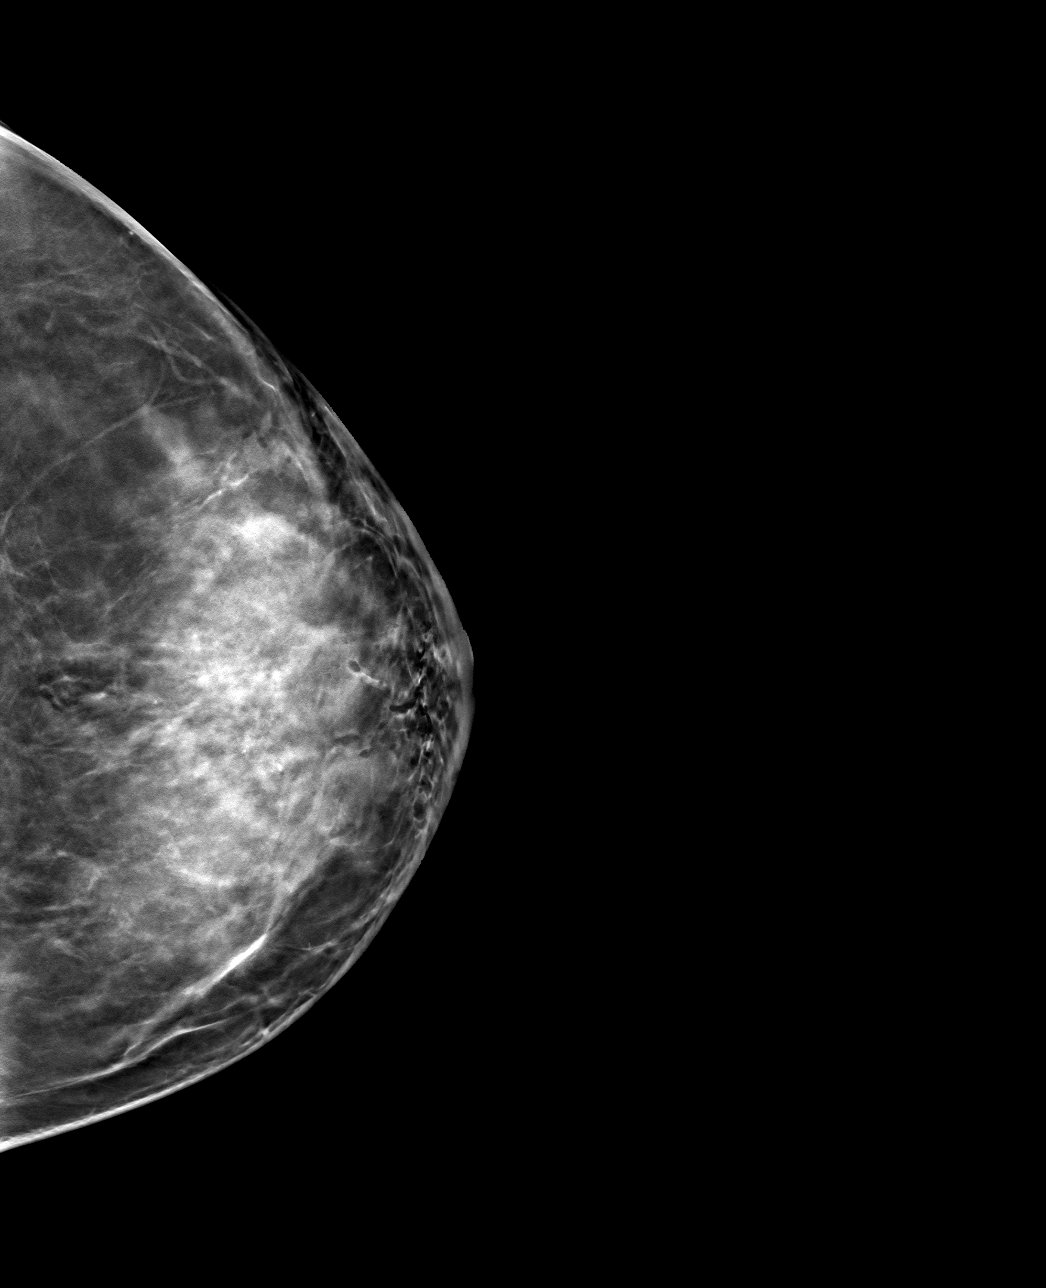

[L ML tomo · tomo slice 45/88.0]
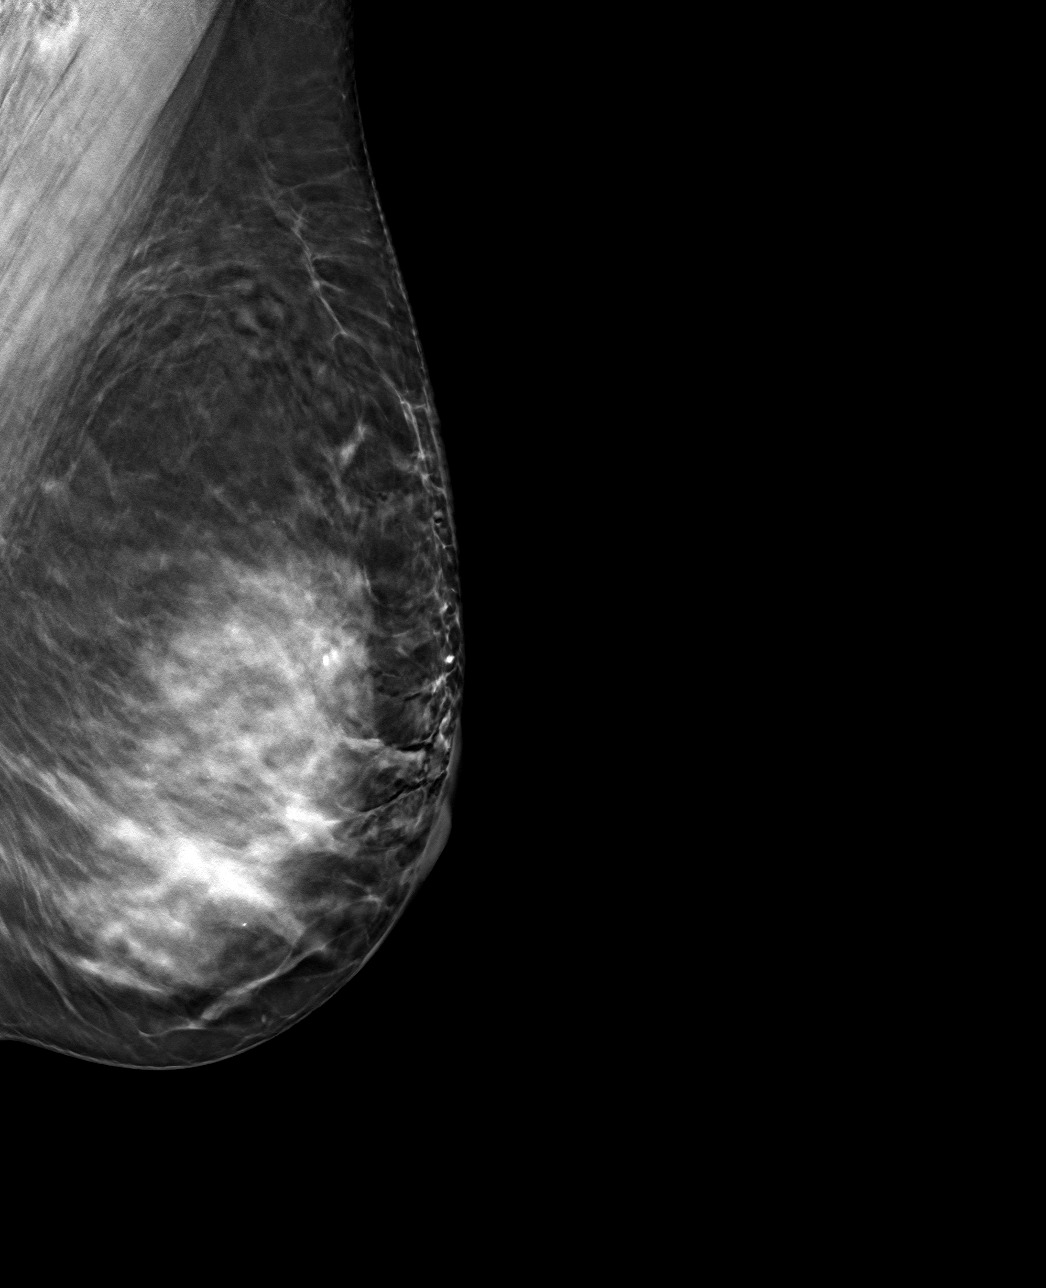

[4 of 12 positions shown; findings below may reference images not displayed]

FINDINGS: 3D Mammographic images were obtained following MRI guided biopsy of
the left breast. The biopsy marking clip is in expected position at
the site of biopsy.
IMPRESSION: Appropriate positioning of the cylinder shaped biopsy marking clip
at the site of biopsy in the upper inner left breast.

Final Assessment: Post Procedure Mammograms for Marker Placement

## 2021-04-29 MED ORDER — GADOBUTROL 1 MMOL/ML IV SOLN
7.0000 mL | Freq: Once | INTRAVENOUS | Status: AC | PRN
  Administered 2021-04-29: 7 mL via INTRAVENOUS

## 2021-06-04 ENCOUNTER — Telehealth: Payer: Self-pay | Admitting: Hematology

## 2021-06-04 NOTE — Telephone Encounter (Signed)
Scheduled per sch msg. Called and left msg  

## 2021-06-05 ENCOUNTER — Other Ambulatory Visit: Payer: Self-pay | Admitting: General Surgery

## 2021-06-05 DIAGNOSIS — R9389 Abnormal findings on diagnostic imaging of other specified body structures: Secondary | ICD-10-CM

## 2021-06-11 ENCOUNTER — Other Ambulatory Visit: Payer: Self-pay | Admitting: Family Medicine

## 2021-06-11 DIAGNOSIS — E039 Hypothyroidism, unspecified: Secondary | ICD-10-CM

## 2021-06-13 ENCOUNTER — Ambulatory Visit: Payer: 59 | Admitting: Hematology

## 2021-06-18 ENCOUNTER — Telehealth: Payer: Self-pay | Admitting: *Deleted

## 2021-06-27 ENCOUNTER — Encounter: Payer: Self-pay | Admitting: Nurse Practitioner

## 2021-06-27 ENCOUNTER — Inpatient Hospital Stay: Payer: 59 | Attending: Nurse Practitioner | Admitting: Nurse Practitioner

## 2021-06-27 ENCOUNTER — Other Ambulatory Visit: Payer: Self-pay

## 2021-06-27 VITALS — BP 121/68 | HR 84 | Temp 97.6°F | Resp 18 | Ht 64.0 in | Wt 174.6 lb

## 2021-06-27 DIAGNOSIS — E785 Hyperlipidemia, unspecified: Secondary | ICD-10-CM | POA: Diagnosis not present

## 2021-06-27 DIAGNOSIS — Z836 Family history of other diseases of the respiratory system: Secondary | ICD-10-CM | POA: Diagnosis not present

## 2021-06-27 DIAGNOSIS — N6022 Fibroadenosis of left breast: Secondary | ICD-10-CM | POA: Diagnosis not present

## 2021-06-27 DIAGNOSIS — N6092 Unspecified benign mammary dysplasia of left breast: Secondary | ICD-10-CM

## 2021-06-27 DIAGNOSIS — Z803 Family history of malignant neoplasm of breast: Secondary | ICD-10-CM | POA: Diagnosis not present

## 2021-06-27 DIAGNOSIS — F419 Anxiety disorder, unspecified: Secondary | ICD-10-CM | POA: Diagnosis not present

## 2021-06-27 DIAGNOSIS — Z8249 Family history of ischemic heart disease and other diseases of the circulatory system: Secondary | ICD-10-CM | POA: Insufficient documentation

## 2021-06-27 DIAGNOSIS — Z8349 Family history of other endocrine, nutritional and metabolic diseases: Secondary | ICD-10-CM | POA: Insufficient documentation

## 2021-06-27 DIAGNOSIS — Z87891 Personal history of nicotine dependence: Secondary | ICD-10-CM | POA: Diagnosis not present

## 2021-06-27 DIAGNOSIS — D0512 Intraductal carcinoma in situ of left breast: Secondary | ICD-10-CM | POA: Diagnosis not present

## 2021-06-27 DIAGNOSIS — E039 Hypothyroidism, unspecified: Secondary | ICD-10-CM | POA: Diagnosis not present

## 2021-06-27 DIAGNOSIS — R232 Flushing: Secondary | ICD-10-CM | POA: Diagnosis not present

## 2021-06-27 DIAGNOSIS — Z79899 Other long term (current) drug therapy: Secondary | ICD-10-CM | POA: Insufficient documentation

## 2021-06-27 DIAGNOSIS — M8580 Other specified disorders of bone density and structure, unspecified site: Secondary | ICD-10-CM | POA: Diagnosis not present

## 2021-06-27 MED ORDER — TAMOXIFEN CITRATE 20 MG PO TABS: 20.0000 mg | ORAL_TABLET | Freq: Every day | ORAL | 0 refills | Status: DC

## 2021-06-27 NOTE — Progress Notes (Signed)
CLINIC:  Survivorship   Patient Care Team: Billie Ruddy, MD as PCP - General (Family Medicine) Mauro Kaufmann, RN as Oncology Nurse Navigator Rockwell Germany, RN as Oncology Nurse Navigator Rolm Bookbinder, MD as Consulting Physician (General Surgery) Truitt Merle, MD as Consulting Physician (Hematology) Kyung Rudd, MD as Consulting Physician (Radiation Oncology) Alla Feeling, NP as Nurse Practitioner (Nurse Practitioner)   REASON FOR VISIT:  Routine follow-up post-treatment for a recent history of breast cancer.  BRIEF ONCOLOGIC HISTORY:  Oncology History  Ductal carcinoma in situ of left breast  10/16/2020 Pathology Results   Breast, left, needle core biopsy, UOQ posterior - DUCTAL CARCINOMA IN SITU WITH CALCIFICATIONS, INTERMEDIATE NUCLEAR GRADE  PROGNOSTIC INDICATORS Results: IMMUNOHISTOCHEMICAL AND MORPHOMETRIC ANALYSIS PERFORMED MANUALLY Estrogen Receptor: 95%, POSITIVE, STRONG STAINING INTENSITY Progesterone Receptor: 20%, POSITIVE, STRONG STAINING INTENSITY   11/09/2020 Initial Diagnosis   Ductal carcinoma in situ of left breast   11/16/2020 Genetic Testing   Negative hereditary cancer genetic testing: no pathogenic variants detected in [facility] Panel.  The report date is Nov 16, 2020. The BRCAplus panel offered by [employer] and includes sequencing and deletion/duplication analysis for the following 8 genes: ATM, BRCA1, BRCA2, CDH1, CHEK2, PALB2, PTEN, and TP53.    Negative hereditary cancer genetic testing: no pathogenic variants detected in [facility] CustomNext-Cancer +RNAinsight Panel.  Variant of uncertain significance detected in CDKN2A at c.440C>T (p.A147V). The report date is Nov 26, 2020.   The CustomNext-Cancer+RNAinsight panel offered by [facility] includes sequencing and rearrangement analysis for the following 47 genes:  APC, ATM, AXIN2, BARD1, BMPR1A, BRCA1, BRCA2, BRIP1, CDH1, CDK4, CDKN2A, CHEK2, DICER1, EPCAM, GREM1, HOXB13, MEN1, MLH1,  MSH2, MSH3, MSH6, MUTYH, NBN, NF1, NF2, NTHL1, PALB2, PMS2, POLD1, POLE, PTEN, RAD51C, RAD51D, RECQL, RET, SDHA, SDHAF2, SDHB, SDHC, SDHD, SMAD4, SMARCA4, STK11, TP53, TSC1, TSC2, and VHL.  RNA data is routinely analyzed for use in variant interpretation for all genes.    12/18/2020 Surgery   Left Lumpectomy  FINAL MICROSCOPIC DIAGNOSIS:   A. BREAST, LEFT, LUMPECTOMY:  - Ductal carcinoma in situ, intermediate grade, involving areas of  sclerosing adenosis, see comment  - Resection margins are negative for DCIS; closest is the posterior  margin at 3 mm  - Biopsy site changes  - Calcifications  - See oncology table   B. BREAST, LEFT ADDITIONAL LATERAL MARGIN, EXCISION:  - Fibrocystic change with sclerosing adenosis and calcifications  - Negative for carcinoma   C. BREAST, LEFT ADDITIONAL POSTERIOR MARGIN, EXCISION:  - Benign breast parenchyma, negative for carcinoma    12/18/2020 Cancer Staging   Staging form: Breast, AJCC 8th Edition - Pathologic stage from 12/18/2020: Stage Unknown (pTis (DCIS), pNX, cM0, G2, ER+, PR+, HER2: Not Assessed) - Signed by Alla Feeling, NP on 06/27/2021 Stage prefix: Initial diagnosis Histologic grading system: 3 grade system    06/27/2021 Survivorship   SCP delivered by Cira Rue, NP   07/2021 -  Anti-estrogen oral therapy   Pending start of chemoprevention with tamoxifen in 07/2021      INTERVAL HISTORY:  [name] presents to the [facility] today for our initial meeting to review her survivorship care plan detailing her treatment course for breast cancer, as well as monitoring long-term side effects of that treatment, education regarding health maintenance, screening, and overall wellness and health promotion.     Overall, Ms. Yates feels well physically but is going through a lot mentally.  Her dad fell around the time she was considering  radiation and antiestrogen so she declined, she was not in a place to accommodate  the radiation schedule and potential medication side effects.  Her dad is now placed locally and is doing okay.  Her ex-husband has ALS and she is dealing with all that comes with that.  She has a daughter in high school and son in college.  She is ready to begin antiestrogen.  She is perimenopausal, has hot flashes and hair loss.  No significant bone or joint pain, she remains very active.  She eats an anti-inflammatory and healthy diet.  She takes calcium and vitamin D, and turmeric.  PCP manages sertraline and other routine meds. ° ° °REVIEW OF SYSTEMS:  °Review of Systems - Oncology °Breast: Denies any new nodularity, masses, tenderness, nipple changes, or nipple discharge.  ° ° ° ° °ONCOLOGY TREATMENT TEAM:  °1. Surgeon:  Dr. Wakefield at [facility] °2. Medical Oncologist: Dr. Feng °3. Radiation Oncologist: Dr. Moody, elected not to proceed with adjuvant radiation °  ° °PAST MEDICAL/SURGICAL HISTORY:  °Past Medical History:  °Diagnosis Date  ° Allergy   ° Anxiety   ° Back pain   ° Constipation   ° Depression   ° Ductal carcinoma in situ of left breast 11/09/2020  ° Family history of breast cancer 11/09/2020  ° HSV (herpes simplex virus) anogenital infection   ° Hyperlipidemia   ° Hypothyroidism   ° Joint pain   ° Plantar fasciitis, bilateral   ° SOBOE (shortness of breath on exertion)   ° °Past Surgical History:  °Procedure Laterality Date  ° BREAST BIOPSY Right 02/28/2015  ° BREAST EXCISIONAL BIOPSY Right 03/29/2015  ° had seed placement as wel  ° BREAST LUMPECTOMY WITH RADIOACTIVE SEED LOCALIZATION Left 12/18/2020  ° Procedure: LEFT BREAST LUMPECTOMY WITH RADIOACTIVE SEED LOCALIZATION;  Surgeon: Wakefield, Matthew, MD;  Location: [facility];  Service: General;  Laterality: Left;  ° ENDOMETRIAL ABLATION    ° MOUTH SURGERY    ° RADIOACTIVE SEED GUIDED EXCISIONAL BREAST BIOPSY Right 04/03/2015  ° Procedure: RADIOACTIVE SEED GUIDED EXCISIONAL BREAST BIOPSY;  Surgeon: Matthew Wakefield,  MD;  Location: [facility];  Service: General;  Laterality: Right;  ° ° ° °ALLERGIES:  °No Known Allergies ° ° °CURRENT MEDICATIONS:  °Outpatient Encounter Medications as of 06/27/2021  °Medication Sig  ° tamoxifen (NOLVADEX) 20 MG tablet Take 1 tablet (20 mg total) by mouth daily.  ° acidophilus (RISAQUAD) CAPS capsule Take by mouth daily.  ° acyclovir ointment (ZOVIRAX) 5 % Apply 1 application topically every 3 (three) hours.  ° b complex vitamins capsule Take 1 capsule by mouth daily.  ° Biotin 10000 MCG TABS Take by mouth.  ° cholecalciferol (VITAMIN D3) 25 MCG (1000 UNIT) tablet Take 1,000 Units by mouth daily.  ° Collagen-Vitamin C-Biotin (COLLAGEN 1500/C PO) Take by mouth.  ° levothyroxine (SYNTHROID) 25 MCG tablet TAKE 1 TABLET(25 MCG) BY MOUTH DAILY BEFORE BREAKFAST  ° Loratadine 10 MG CHEW Chew 10 mg by mouth daily as needed.  ° polyethylene glycol (MIRALAX / GLYCOLAX) 17 g packet Take 17 g by mouth daily.  ° sertraline (ZOLOFT) 50 MG tablet Take 1 tablet (50 mg total) by mouth daily.  ° Turmeric 500 MG TABS Take by mouth.  ° vitamin E 1000 UNIT capsule Take 1,000 Units by mouth daily.  ° [DISCONTINUED] meloxicam (MOBIC) 15 MG tablet Take 15 mg by mouth 3 (three) times daily.  ° [DISCONTINUED] valACYclovir (VALTREX) 1000 MG tablet Take 1,000 mg by   mouth 2 (two) times daily.  ° °No facility-administered encounter medications on file as of 06/27/2021.  ° ° ° °ONCOLOGIC FAMILY HISTORY:  °Family History  °Problem Relation Age of Onset  ° Heart disease Mother   ° Heart failure Mother   ° Heart attack Mother   ° Hyperlipidemia Mother   ° Sudden death Mother   ° Heart disease Father   ° Hyperlipidemia Father   ° Hypertension Father   ° Thyroid disease Father   ° Sleep apnea Father   ° Obesity Father   ° Breast cancer Paternal Grandmother   °     dx 40s, d. 47  ° Breast cancer Other   °     PGM's sisters, x2, dx unknown age  ° Colon cancer Neg Hx   ° Esophageal cancer Neg Hx   ° Rectal cancer Neg Hx    ° Stomach cancer Neg Hx   ° ° ° °GENETIC COUNSELING/TESTING: °Yes, VUS in CDKN2A ° °SOCIAL HISTORY:  °Reyna Barkley Franzel is divorced with a significant other, lives in [place]  She has 2 children.  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  She is a former smoker, denies alcohol, or illicit drug use.   ° ° °PHYSICAL EXAMINATION:  °Vital Signs: °  °Vitals:  ° 06/27/21 1243  °BP: 121/68  °Pulse: 84  °Resp: 18  °Temp: 97.6 °F (36.4 °C)  °SpO2: 97%  ° °Filed Weights  ° 06/27/21 1243  °Weight: 174 lb 9 oz (79.2 kg)  ° °General: Well-nourished, well-appearing female in no acute distress.   °HEENT: Sclerae anicteric.  °Lymph: No cervical, supraclavicular, or infraclavicular lymphadenopathy noted on palpation.  °Respiratory: breathing non-labored.  °Neuro: No focal deficits. Steady gait.  °Psych: Mood and affect normal and appropriate for situation.  °Extremities: No edema. °MSK: Full range of motion in bilateral upper extremities °Skin: Warm and dry. °Breast exam: Breasts are symmetric without nipple discharge or inversion.  S/p left lumpectomy, incisions completely healed.  No palpable mass or nodularity in either breast or axilla that I could appreciate. ° °LABORATORY DATA:  °None for this visit. ° °DIAGNOSTIC IMAGING:  °None for this visit.  ° °  ° °ASSESSMENT AND PLAN:  °Ms.. Yates is a pleasant 55 y.o. female with Stage 0 left breast ER+/PR+ DCIS, diagnosed in 10/2020 treated with lumpectomy. She presents to the Survivorship Clinic for our initial meeting and routine follow-up post-completion of treatment for breast cancer.  ° ° °1. Stage 0 left breast DCIS right/left breast cancer:  Ms. Callies has recovered well from left lumpectomy in 12/2020.  She did not proceed with adjuvant radiation. We reviewed [facility] risk tools.  She is ready to begin antiestrogen therapy with tamoxifen.  Potential side effects, risk/benefit were reviewed.  She agrees to  proceed.  Given the DDI with sertraline, I recommend to transition to an alternative, I will message PCP.  She can start tamoxifen once this is done.  She will then follow-up with her medical oncologist, Dr. Feng in 3 months with history and physical exam per surveillance protocol.  She was instructed to make Dr. Feng or myself aware if she begins to experience any worsening side effects of the medication and I could see her back in clinic to help manage those side effects, as needed. Though the incidence is low, there is an associated risk of endometrial cancer with anti-estrogen therapies like Tamoxifen.  Ms. Rezendes was encouraged to contact Dr.   Feng or myself with any vaginal bleeding while taking Tamoxifen. Other side effects of Tamoxifen were again reviewed with her as well. Today, a comprehensive survivorship care plan and treatment summary was reviewed with the patient today detailing her breast cancer diagnosis, treatment course, potential late/long-term effects of treatment, appropriate follow-up care with recommendations for the future, and patient education resources.  A copy of this summary, along with a letter will be sent to the patient’s primary care provider In Basket message after today’s visit.   ° °2.  Left breast ALH-biopsy-proven 04/29/2021.  She has repeat biopsy scheduled 07/11/2021 per Dr. Wakefield.  We will follow-up on the results.  I reviewed tamoxifen is appropriate chemoprevention for this high risk lesion that increases risk of future breast cancer. ° °3. Bone health: Baseline DEXA 02/07/2021 showed mild osteopenia but high FRAX score with 17% risk of major osteoporotic fracture in 10 years.  She does weightbearing exercise and takes calcium/vitamin D.  We discussed the bone strengthening quality of tamoxifen.  ° °4. Cancer screening:  Due to Ms. Sperry's history and her age, she should receive screening for skin cancers, colon cancer, and gynecologic cancers.  She is reportedly  up-to-date. ° °5. Health maintenance and wellness promotion: Ms. Essex was encouraged to consume 5-7 servings of fruits and vegetables per day. We reviewed the "Nutrition Rainbow" handout. She was also encouraged to engage in moderate to vigorous exercise for 30 minutes per day most days of the week. She was instructed to limit her alcohol consumption and continue to abstain from tobacco use.  °  °6. Support services/counseling: It is not uncommon for this period of the patient's cancer care trajectory to be one of many emotions and stressors.  She has other life events ongoing and I provided active listening and emotional support.  She has strong social network to reach out to if needed.  Let her know we have social work/community resources available as necessary. She was offered support today through active listening and expressive supportive counseling.   ° ° °Dispo:   °-Follow-up PCP to wean off sertraline start tamoxifen  °-return to cancer center in 3 months °-Left breast biopsy 07/11/2021 °-Follow up with surgery as scheduled °-She is welcome to return back to the Survivorship Clinic at any time; no additional follow-up needed at this time.  °-Consider referral back to survivorship as a long-term survivor for continued surveillance ° °A total of (40) minutes of face-to-face time was spent with this patient with greater than 50% of that time in counseling and care-coordination. ° ° °Lacie Burton, NP °Survivorship Program °[facility] °[phone] ° ° °Note: PRIMARY CARE PROVIDER °Banks, Shannon R, MD °[phone] °[phone] °  °

## 2021-07-10 ENCOUNTER — Ambulatory Visit: Payer: 59 | Admitting: Hematology

## 2021-07-11 ENCOUNTER — Other Ambulatory Visit: Payer: Self-pay

## 2021-07-11 ENCOUNTER — Ambulatory Visit
Admission: RE | Admit: 2021-07-11 | Discharge: 2021-07-11 | Disposition: A | Discharge status: Home / Self Care | Payer: Self-pay | Source: Ambulatory Visit | Attending: General Surgery | Admitting: General Surgery

## 2021-07-11 ENCOUNTER — Other Ambulatory Visit: Payer: Self-pay | Admitting: Radiology

## 2021-07-11 ENCOUNTER — Ambulatory Visit
Admission: RE | Admit: 2021-07-11 | Discharge: 2021-07-11 | Disposition: A | Discharge status: Home / Self Care | Payer: 59 | Source: Ambulatory Visit | Attending: General Surgery | Admitting: General Surgery

## 2021-07-11 DIAGNOSIS — R9389 Abnormal findings on diagnostic imaging of other specified body structures: Secondary | ICD-10-CM

## 2021-07-11 HISTORY — PX: BREAST BIOPSY: SHX20

## 2021-07-11 IMAGING — MG MM BREAST LOCALIZATION CLIP
4 series · 4 of 12 positions shown · non-contrast
Comparison: Previous exam(s).

CLINICAL DATA: Evaluate biopsy marker

EXAM:
3D DIAGNOSTIC LEFT MAMMOGRAM POST MRI BIOPSY

[L ML synth-2D]
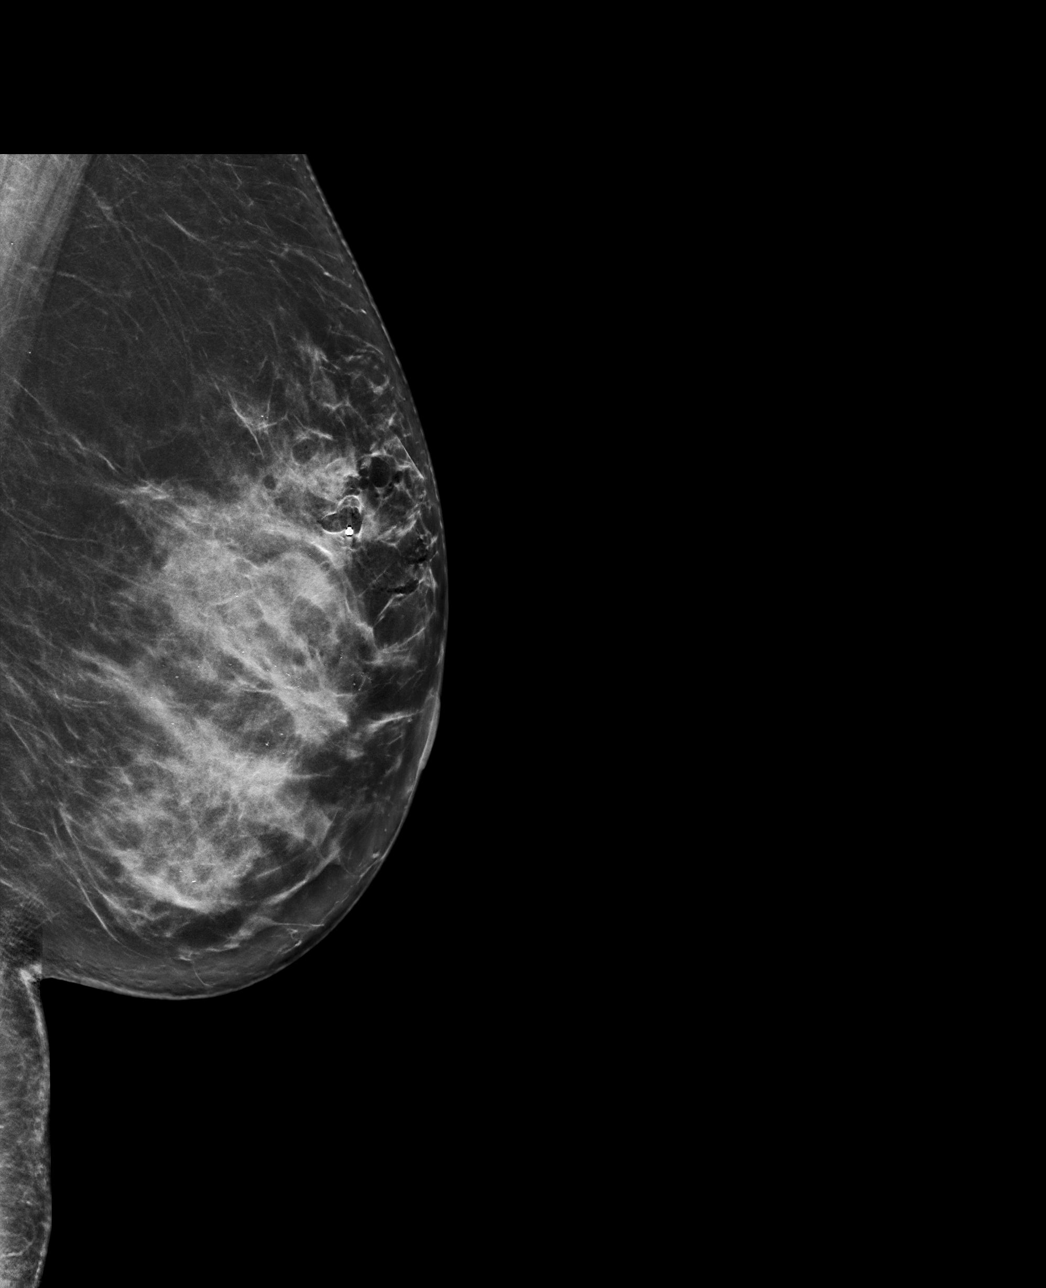

[L CC synth-2D]
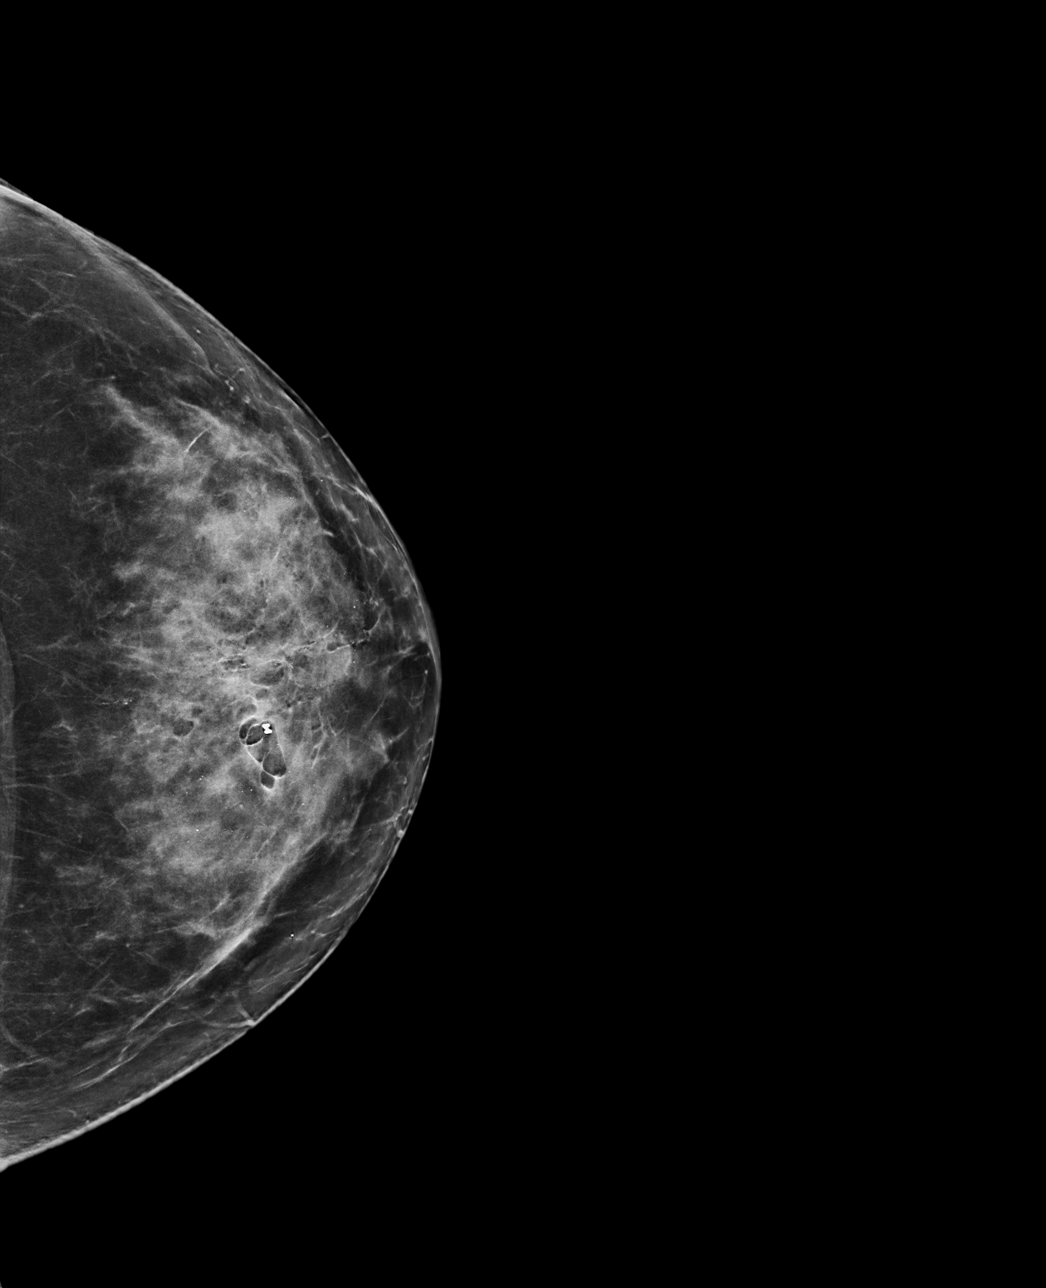

[L CC tomo · tomo slice 41/81.0]
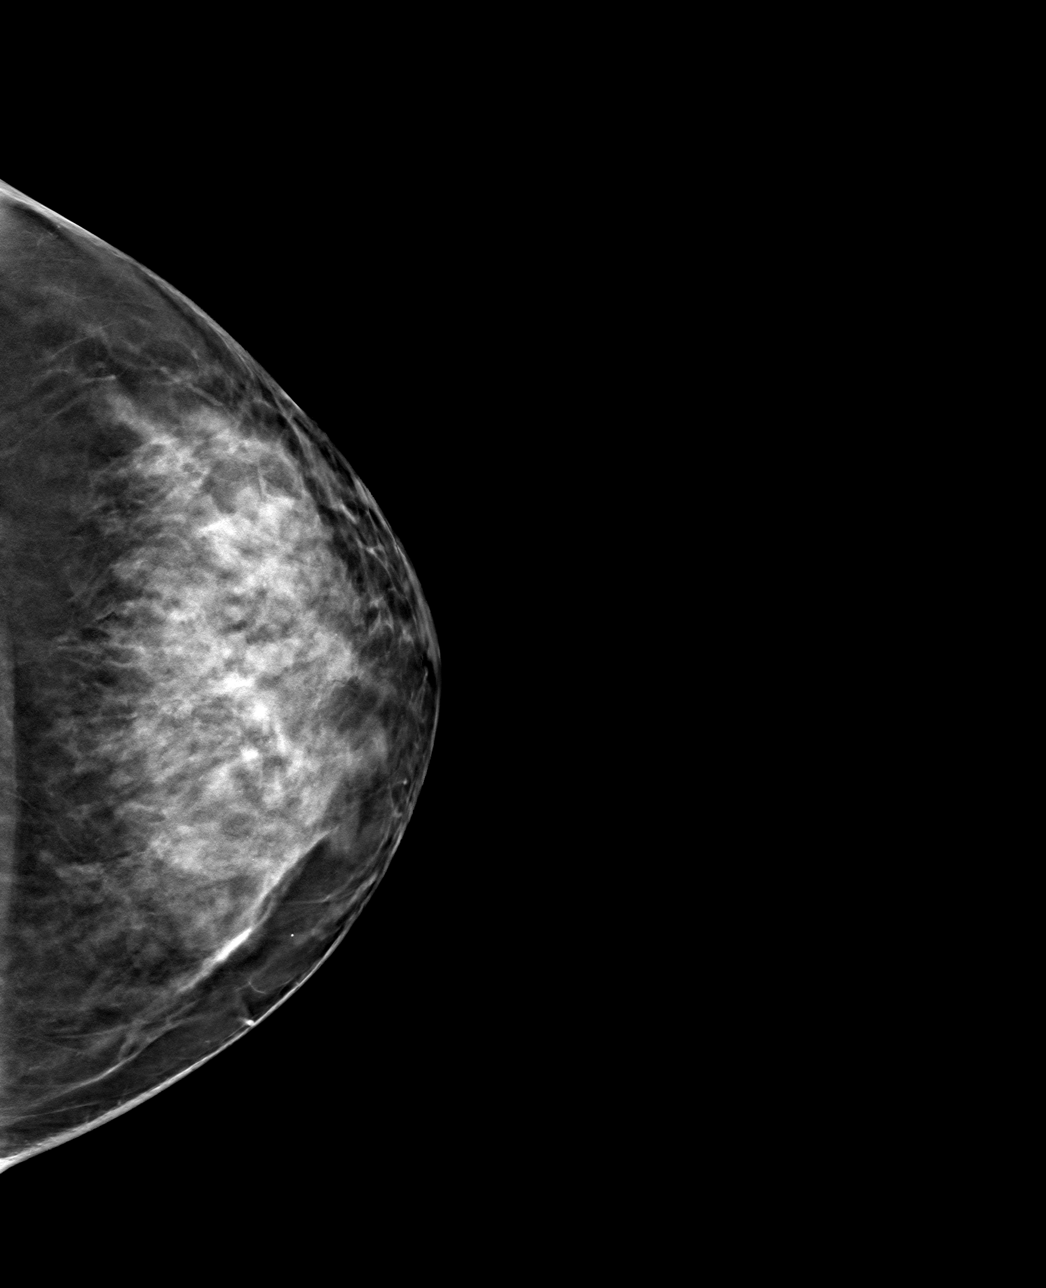

[L ML tomo · tomo slice 42/83.0]
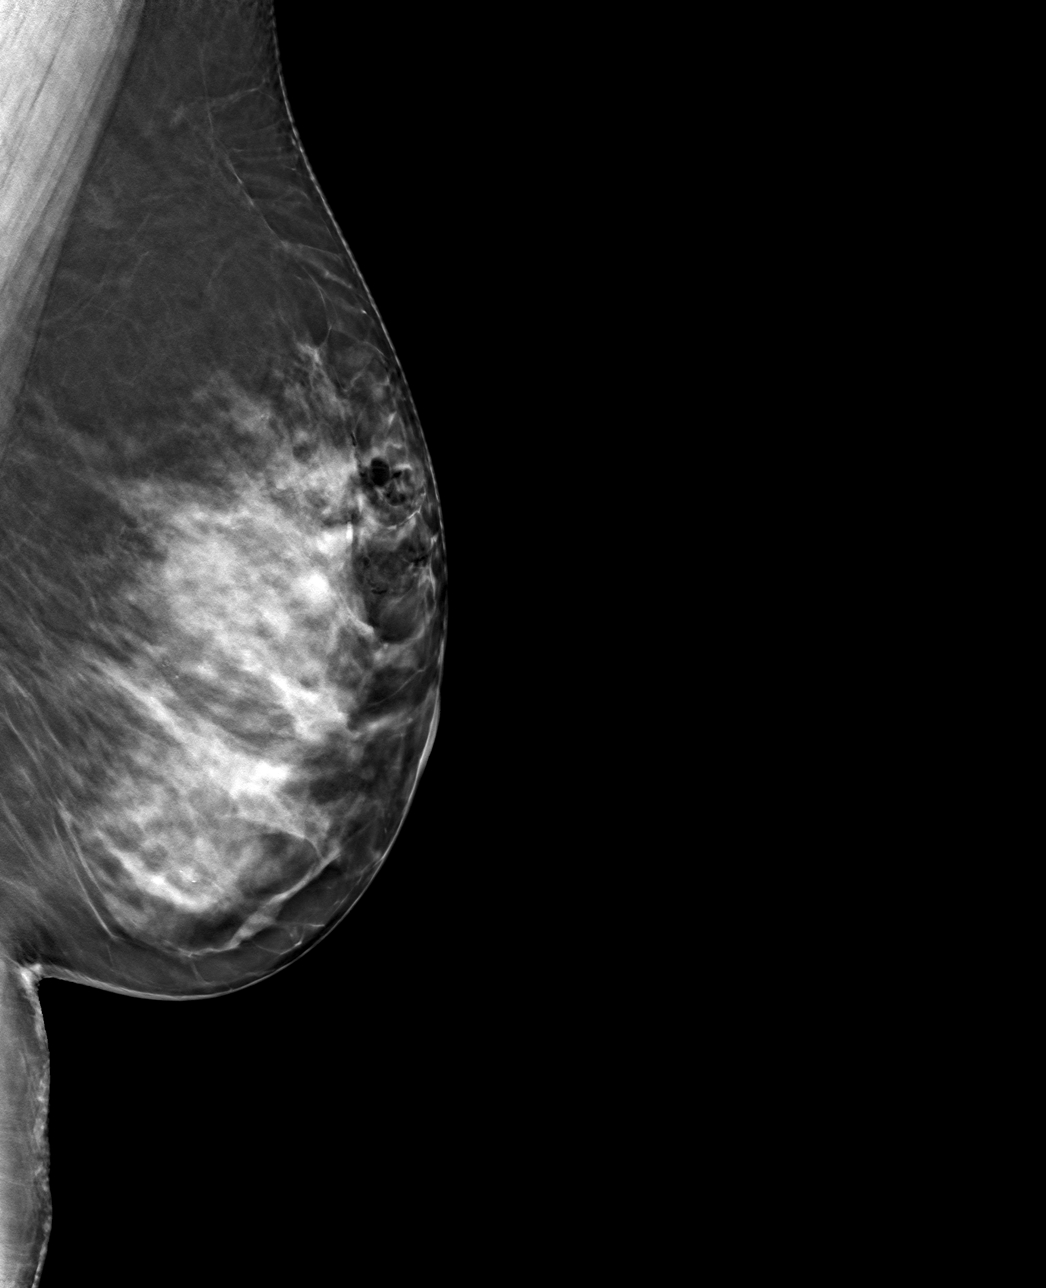

[4 of 12 positions shown; findings below may reference images not displayed]

FINDINGS: 3D Mammographic images were obtained following MRI guided biopsy of
non mass enhancement in the upper inner left breast. The biopsy
marking clip is in expected position at the site of biopsy. Of note,
only the dumbbell shaped marker is identified. The previously placed
cylinder was removed during today's biopsy.
IMPRESSION: The dumbbell shaped marker placed today is in the upper inner left
breast in the expected location of the biopsied non mass
enhancement. The previously placed cylinder shaped marker was
removed at the time of today's biopsy.

Final Assessment: Post Procedure Mammograms for Marker Placement

## 2021-07-11 IMAGING — MR MR BREAST BX W LOC DEV 1ST LESION IMAGE BX SPEC MR GUIDE*L*
6 of 8 series · 31 of 48 positions shown · IV contrast (gadavist)
Comparison: Previous exams.
COMPARISON: Previous exams.

Addendum:
CLINICAL DATA: Linear non mass enhancement was identified on an MRI
dated [DATE]. The patient presented for MRI guided biopsy
on [DATE]. Unfortunately, after 1 sample, the biopsy
device malfunction and no other samples could be obtained. That
biopsy demonstrated atypical lobular hyperplasia involving a small
sclerotic lesion. The patient was sent to a surgeon. The patient
returns for additional biopsies to better discern the abnormality.

EXAM:
MRI GUIDED CORE NEEDLE BIOPSY OF THE LEFT BREAST
TECHNIQUE: Multiplanar, multisequence MR imaging of the breast was performed
both before and after administration of intravenous contrast.
CONTRAST:  7mL GADAVIST GADOBUTROL 1 MMOL/ML IV SOLN

[Series 2: fiducial unilateral · sagittal · 2.0mm · 1.33mm/px · 1 of 52 slices shown]
[im 1/52]
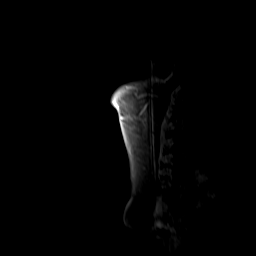

[Series 3: dynamic pre · axial · non-contrast · 1.3mm · 0.73mm/px · z∈[-92,+93]mm · 6 of 144 slices shown]
[im 1/144]
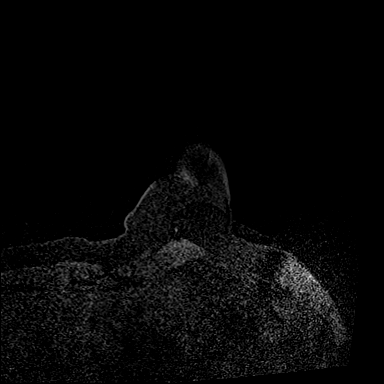
[im 29/144]
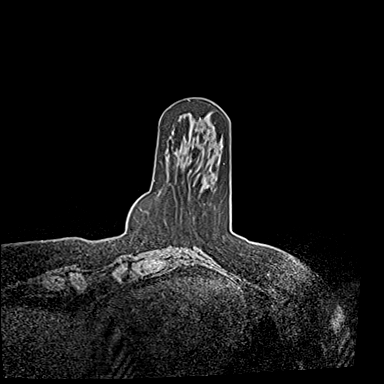
[im 58/144]
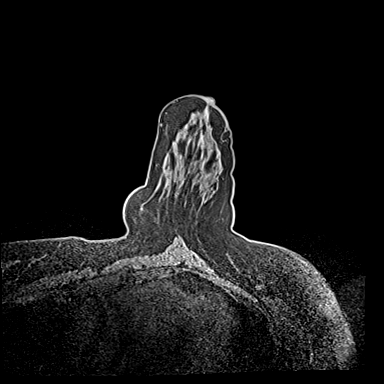
[im 86/144]
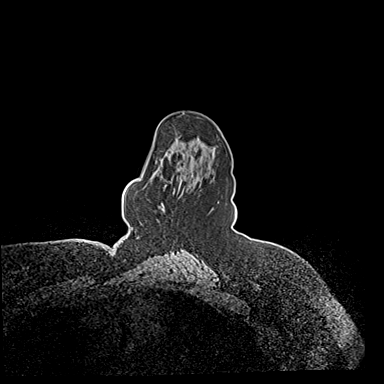
[im 115/144]
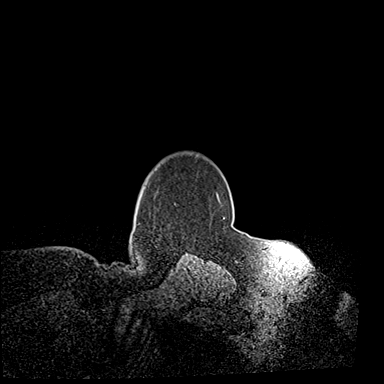
[im 144/144]
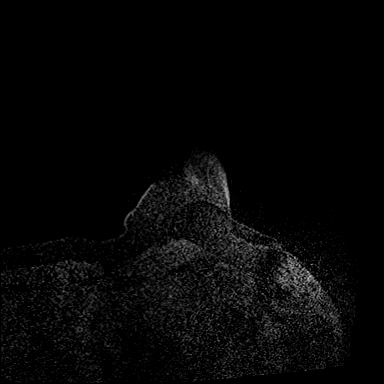

[Series 4: dynamic post 20 · axial · 1.3mm · 0.73mm/px · z∈[-92,+93]mm · 6 of 144 slices shown (1 of 2)]
[im 1/144]
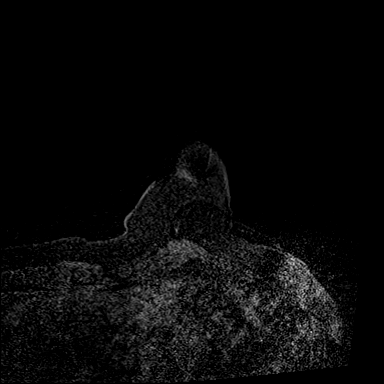
[im 29/144]
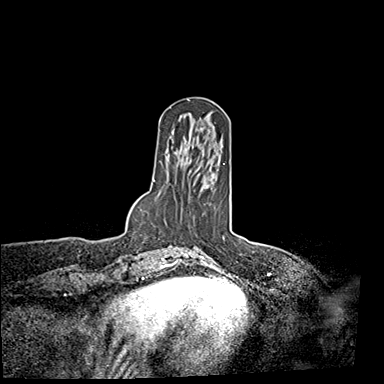
[im 58/144]
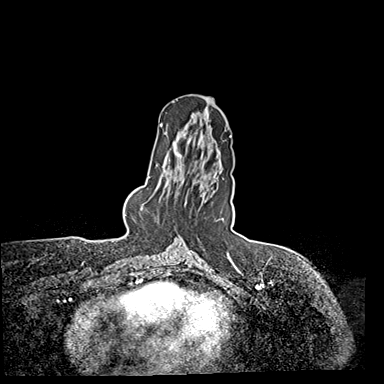
[im 86/144]
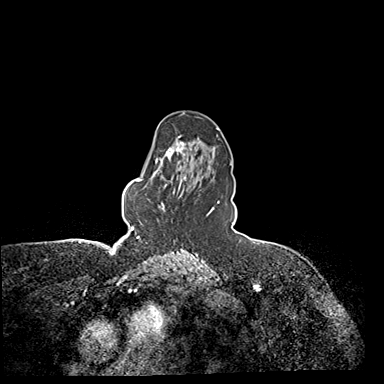
[im 115/144]
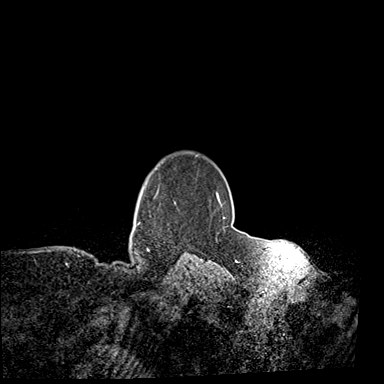
[im 144/144]
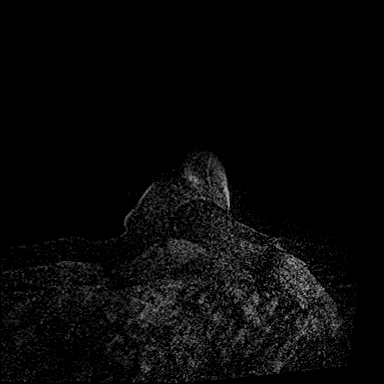

[Series 5: dynamic post 20 · axial · 1.3mm · 0.73mm/px · z∈[-92,+93]mm · 7 of 144 slices shown (2 of 2)]
[im 1/144]
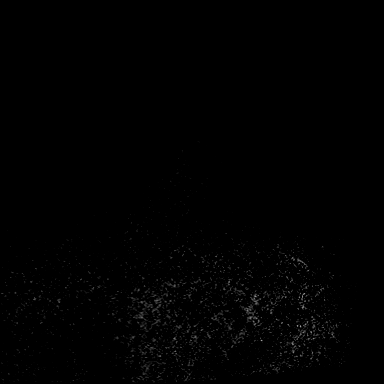
[im 24/144]
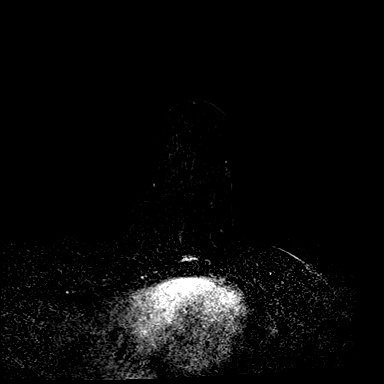
[im 48/144]
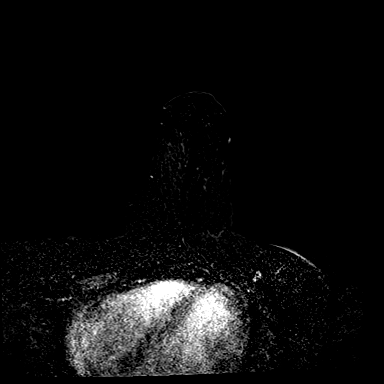
[im 72/144]
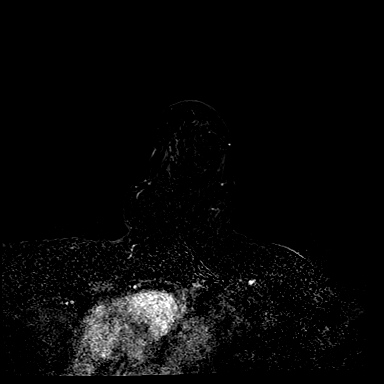
[im 96/144]
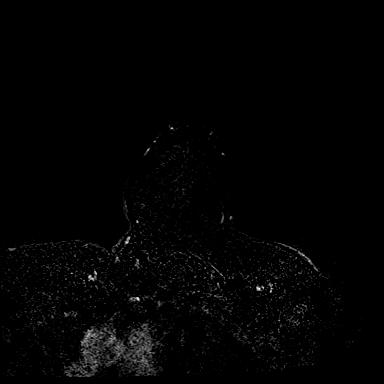
[im 120/144]
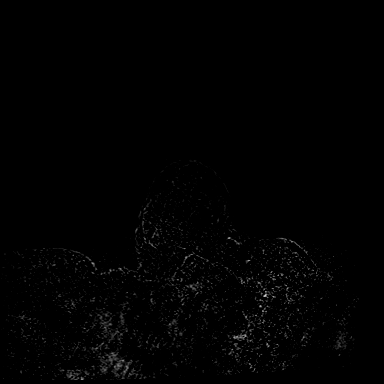
[im 144/144]
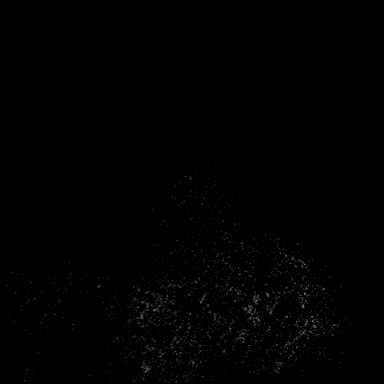

[Series 6: needle confirmation · axial · 1.3mm · 0.73mm/px · z∈[-92,+93]mm · 7 of 144 slices shown]
[im 1/144]
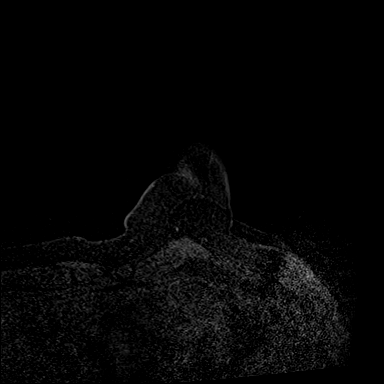
[im 24/144]
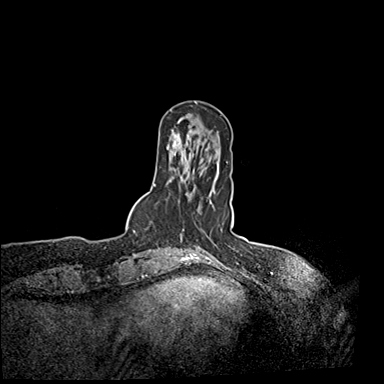
[im 48/144]
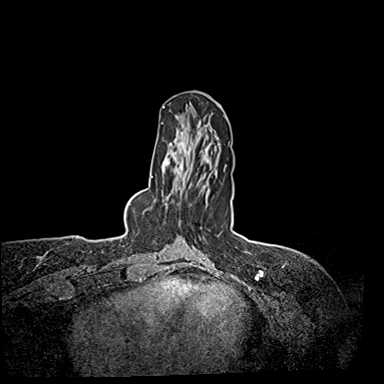
[im 72/144]
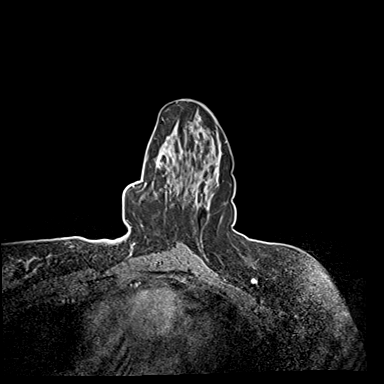
[im 96/144]
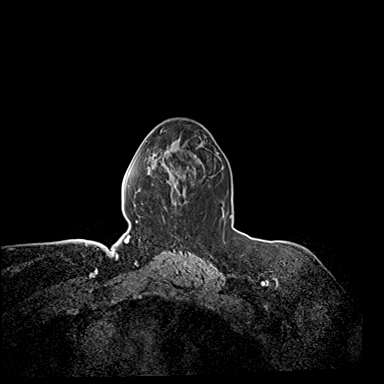
[im 120/144]
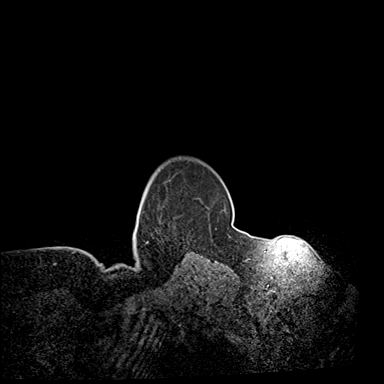
[im 144/144]
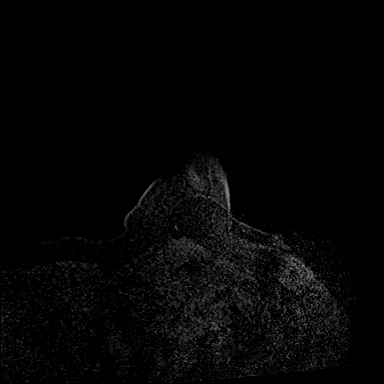

[Series 7: needle confirmation_sub · axial · 1.3mm · 0.73mm/px · z∈[-92,-0]mm · 4 of 144 slices shown]
[im 1/144]
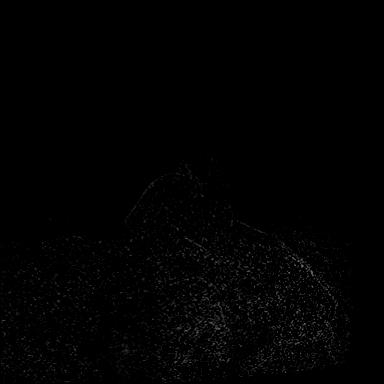
[im 24/144]
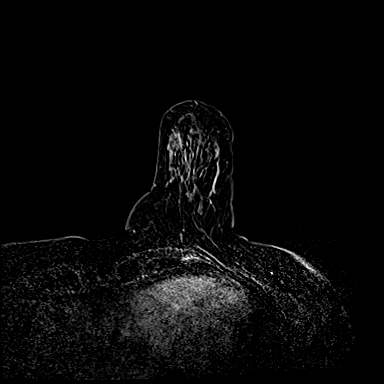
[im 48/144]
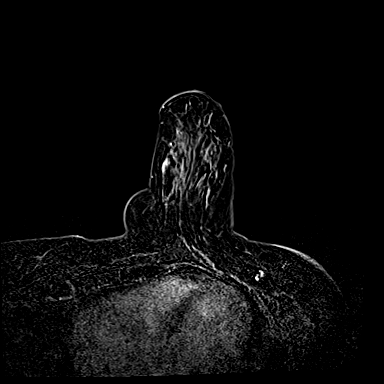
[im 72/144]
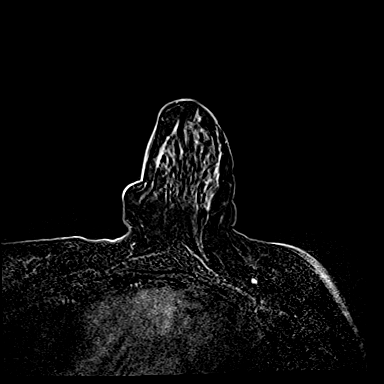

[31 of 48 positions shown; findings below may reference images not displayed]

FINDINGS: I met with the patient, and we discussed the procedure of MRI guided
biopsy, including risks, benefits, and alternatives. Specifically,
we discussed the risks of infection, bleeding, tissue injury, clip
migration, and inadequate sampling. Informed, written consent was
given. The usual time out protocol was performed immediately prior
to the procedure.

Using sterile technique, 1% Lidocaine, MRI guidance, and a 9 gauge
vacuum assisted device, biopsy was performed of the non mass
enhancement in the upper inner left breast using a lateral approach.
At the conclusion of the procedure, a dumbbell shaped tissue marker
clip was deployed into the biopsy cavity. Follow-up 2-view mammogram
was performed and dictated separately.
IMPRESSION: MRI guided biopsy of non mass enhancement in the upper inner left
breast. No apparent complications.

ADDENDUM:
Pathology revealed LOBULAR NEOPLASIA (ATYPICAL LOBULAR HYPERPLASIA),
SCLEROSING ADENOSIS of the LEFT breast, upper inner, dumbbell clip)
this was found to be concordant by Dr. MANQANIS with surgical
consultation and High Risk Screening recommended.

Pathology results were discussed with the patient by telephone. The
patient reported doing well after the biopsy with tenderness at the
site. Post biopsy instructions and care were reviewed and questions
were answered. The patient was encouraged to call The [REDACTED] for any additional concerns. My direct number
was provided.

Follow up protocol for patients with lobular neoplasia being
observed will have diagnostic mammograms for two years (6 month
follow up, 6 month follow up, 12 month follow up), then returned to
annual screening mammograms.

The patient was instructed to return for annual diagnostic
mammography in [DATE].

The patient was instructed to return for a bilateral breast MRI in 6
months, per protocol.

The patient presented for MRI guided biopsy on [DATE].
That biopsy demonstrated atypical lobular hyperplasia involving a
small sclerotic lesion of the LEFT breast, upper inner, anterior,
(cylinder clip).

The patient is under the care of Dr. MANQANIS at [REDACTED]. Dr. MANQANIS was notified of biopsy results via
[REDACTED] message on [DATE].

Pathology results reported by MANQANIS, RN on [DATE].

*** End of Addendum ***
FINDINGS: I met with the patient, and we discussed the procedure of MRI guided
biopsy, including risks, benefits, and alternatives. Specifically,
we discussed the risks of infection, bleeding, tissue injury, clip
migration, and inadequate sampling. Informed, written consent was
given. The usual time out protocol was performed immediately prior
to the procedure.

Using sterile technique, 1% Lidocaine, MRI guidance, and a 9 gauge
vacuum assisted device, biopsy was performed of the non mass
enhancement in the upper inner left breast using a lateral approach.
At the conclusion of the procedure, a dumbbell shaped tissue marker
clip was deployed into the biopsy cavity. Follow-up 2-view mammogram
was performed and dictated separately.
IMPRESSION: MRI guided biopsy of non mass enhancement in the upper inner left
breast. No apparent complications.

## 2021-07-11 MED ORDER — GADOBUTROL 1 MMOL/ML IV SOLN
7.0000 mL | Freq: Once | INTRAVENOUS | Status: AC | PRN
  Administered 2021-07-11: 7 mL via INTRAVENOUS

## 2021-07-16 ENCOUNTER — Telehealth: Payer: Self-pay | Admitting: Family Medicine

## 2021-07-16 NOTE — Telephone Encounter (Signed)
Patient stated that the sertraline (ZOLOFT) 50 MG tablet [[id number]]  Dr.Banks prescribed to her is interacting with the tamoxifen (NOLVADEX) 20 MG tablet [[id number]] that her oncologist prescribed.  Patient would like a call back at [phone].  Please advise.

## 2021-07-17 NOTE — Telephone Encounter (Signed)
Spoke with pt, appointment made.

## 2021-07-18 ENCOUNTER — Telehealth (INDEPENDENT_AMBULATORY_CARE_PROVIDER_SITE_OTHER): Payer: 59 | Admitting: Family Medicine

## 2021-07-18 ENCOUNTER — Encounter: Payer: Self-pay | Admitting: Family Medicine

## 2021-07-18 DIAGNOSIS — F411 Generalized anxiety disorder: Secondary | ICD-10-CM

## 2021-07-18 DIAGNOSIS — E039 Hypothyroidism, unspecified: Secondary | ICD-10-CM | POA: Diagnosis not present

## 2021-07-18 DIAGNOSIS — F330 Major depressive disorder, recurrent, mild: Secondary | ICD-10-CM | POA: Diagnosis not present

## 2021-07-18 DIAGNOSIS — D0512 Intraductal carcinoma in situ of left breast: Secondary | ICD-10-CM

## 2021-07-18 MED ORDER — LEVOTHYROXINE SODIUM 25 MCG PO TABS: ORAL_TABLET | ORAL | 1 refills | Status: DC

## 2021-07-18 NOTE — Progress Notes (Signed)
Virtual Visit via Video Note  I connected with Tara Yates on 07/18/21 at  4:30 PM EST by a video enabled telemedicine application 2/2 [id number]-43 pandemic and verified that I am speaking with the correct person using two identifiers.  Location patient: home Location provider:work or home office Persons participating in the virtual visit: patient, provider  I discussed the limitations of evaluation and management by telemedicine and the availability of in person appointments. The patient expressed understanding and agreed to proceed.  Chief Complaint  Patient presents with   Medication Problem    Oncology states the sertraline is interacting with the tamoxifen, would like to see if sertraline could be changed or switched    HPI: Pt on Zoloft 50 mg which is working great, but was advised it can decrease the effectiveness of tamoxifen.  Would like to see if she could eventually stop taking antidepressants.  pt just started Tamoxifen on Sunday.  Advised to start tamoxifen sooner however was unable to as she was caring for her father and hesitant about possible side effects.  Starting to prioritize her health.  Exercising regularly.  Eating foods that decrease inflammation.  [facility]  Has been unable to obtain 90-day supply of medication from [place].  Requesting refill of Synthroid.  ROS: See pertinent positives and negatives per HPI.  Past Medical History:  Diagnosis Date   Allergy    Anxiety    Back pain    Constipation    Depression    Ductal carcinoma in situ of left breast 11/09/2020   Family history of breast cancer 11/09/2020   HSV (herpes simplex virus) anogenital infection    Hyperlipidemia    Hypothyroidism    Joint pain    Plantar fasciitis, bilateral    SOBOE (shortness of breath on exertion)     Past Surgical History:  Procedure Laterality Date   BREAST BIOPSY Right 02/28/2015   BREAST BIOPSY Left 07/11/2021   BREAST  EXCISIONAL BIOPSY Right 03/29/2015   had seed placement as wel   BREAST LUMPECTOMY WITH RADIOACTIVE SEED LOCALIZATION Left 12/18/2020   Procedure: LEFT BREAST LUMPECTOMY WITH RADIOACTIVE SEED LOCALIZATION;  Surgeon: Rolm Bookbinder, MD;  Location: [facility];  Service: General;  Laterality: Left;   ENDOMETRIAL ABLATION     MOUTH SURGERY     RADIOACTIVE SEED GUIDED EXCISIONAL BREAST BIOPSY Right 04/03/2015   Procedure: RADIOACTIVE SEED GUIDED EXCISIONAL BREAST BIOPSY;  Surgeon: Rolm Bookbinder, MD;  Location: [facility];  Service: General;  Laterality: Right;    Family History  Problem Relation Age of Onset   Heart disease Mother    Heart failure Mother    Heart attack Mother    Hyperlipidemia Mother    Sudden death Mother    Heart disease Father    Hyperlipidemia Father    Hypertension Father    Thyroid disease Father    Sleep apnea Father    Obesity Father    Breast cancer Paternal Grandmother        dx 76s, d. 31   Breast cancer Other        PGM's sisters, x2, dx unknown age   Colon cancer Neg Hx    Esophageal cancer Neg Hx    Rectal cancer Neg Hx    Stomach cancer Neg Hx      Current Outpatient Medications:    acidophilus (RISAQUAD) CAPS capsule, Take by mouth daily., Disp: , Rfl:    acyclovir ointment (ZOVIRAX)  5 %, Apply 1 application topically every 3 (three) hours., Disp: , Rfl:    b complex vitamins capsule, Take 1 capsule by mouth daily., Disp: , Rfl:    Biotin 10000 MCG TABS, Take by mouth., Disp: , Rfl:    cholecalciferol (VITAMIN D3) 25 MCG (1000 UNIT) tablet, Take 1,000 Units by mouth daily., Disp: , Rfl:    Collagen-Vitamin C-Biotin (COLLAGEN 1500/C PO), Take by mouth., Disp: , Rfl:    levothyroxine (SYNTHROID) 25 MCG tablet, TAKE 1 TABLET(25 MCG) BY MOUTH DAILY BEFORE BREAKFAST, Disp: 90 tablet, Rfl: 1   Loratadine 10 MG CHEW, Chew 10 mg by mouth daily as needed., Disp: , Rfl:    polyethylene glycol (MIRALAX / GLYCOLAX) 17 g  packet, Take 17 g by mouth daily., Disp: , Rfl:    sertraline (ZOLOFT) 50 MG tablet, Take 1 tablet (50 mg total) by mouth daily., Disp: 90 tablet, Rfl: 1   tamoxifen (NOLVADEX) 20 MG tablet, Take 1 tablet (20 mg total) by mouth daily., Disp: 90 tablet, Rfl: 0   Turmeric 500 MG TABS, Take by mouth., Disp: , Rfl:    vitamin E 1000 UNIT capsule, Take 1,000 Units by mouth daily., Disp: , Rfl:   EXAM:  VITALS per patient if applicable: RR between 45-36 bpm  GENERAL: alert, oriented, appears well and in no acute distress  HEENT: atraumatic, conjunctiva clear, no obvious abnormalities on inspection of external nose and ears  NECK: normal movements of the head and neck  LUNGS: on inspection no signs of respiratory distress, breathing rate appears normal, no obvious gross SOB, gasping or wheezing  CV: no obvious cyanosis  MS: moves all visible extremities without noticeable abnormality  PSYCH/NEURO: pleasant and cooperative, no obvious depression or anxiety, speech and thought processing grossly intact   ASSESSMENT AND PLAN:  Discussed the following assessment and plan:  GAD (generalized anxiety disorder) -will wean Zoloft from 50 mg to 25 mg. -Continue to monitor  Acquired hypothyroidism -Stable -TSH normal at 3.48 on 08/17/2020 - Plan: levothyroxine (SYNTHROID) 25 MCG tablet  Mild episode of recurrent major depressive disorder (HCC) -Wean Zoloft from 50 mg to 25 mg daily -Monitor for increased depression symptoms -Self-care encouraged  Ductal carcinoma in situ (DCIS) of left breast -Continue tamoxifen 20 mg daily -Continue follow-up with oncology  Follow-up in 4-6 weeks at CPE  I discussed the assessment and treatment plan with the patient. The patient was provided an opportunity to ask questions and all were answered. The patient agreed with the plan and demonstrated an understanding of the instructions.   The patient was advised to call back or seek an in-person  evaluation if the symptoms worsen or if the condition fails to improve as anticipated.  Billie Ruddy, MD

## 2021-07-23 ENCOUNTER — Other Ambulatory Visit: Payer: 59

## 2021-07-31 ENCOUNTER — Other Ambulatory Visit: Payer: Self-pay

## 2021-07-31 MED ORDER — TAMOXIFEN CITRATE 10 MG PO TABS: 10.0000 mg | ORAL_TABLET | Freq: Every day | ORAL | 0 refills | Status: DC

## 2021-07-31 NOTE — Progress Notes (Signed)
Pt called in c/o joint pain 8/10 pain.  Pt taking Advil for pain but relief only at 6/10.  Pt requesting is Tamoxifen dose could be reduced from 20mg  to 10mg .  Spoke with Dr. Burr Medico regarding pt's complaints and Dr. Burr Medico gave verbal order to reduce Tamoxifen dose to 10mg  daily.  Pt also wanted to know if she could take tumeric with medication for her joint pain.  Dr. Burr Medico was not sure and is checking with pharmacy to see if Tumeric is contraindicated while taking Tamoxifen.  [facility] and MyChart message to patient with Dr. Ernestina Penna response.

## 2021-08-07 ENCOUNTER — Other Ambulatory Visit: Payer: Self-pay

## 2021-08-19 ENCOUNTER — Encounter: Payer: Self-pay | Admitting: Family Medicine

## 2021-08-19 ENCOUNTER — Ambulatory Visit (INDEPENDENT_AMBULATORY_CARE_PROVIDER_SITE_OTHER): Payer: 59 | Admitting: Family Medicine

## 2021-08-19 VITALS — BP 120/80 | HR 64 | Temp 98.6°F | Ht 63.5 in | Wt 172.0 lb

## 2021-08-19 DIAGNOSIS — Z23 Encounter for immunization: Secondary | ICD-10-CM | POA: Diagnosis not present

## 2021-08-19 DIAGNOSIS — E039 Hypothyroidism, unspecified: Secondary | ICD-10-CM | POA: Diagnosis not present

## 2021-08-19 DIAGNOSIS — D0512 Intraductal carcinoma in situ of left breast: Secondary | ICD-10-CM | POA: Diagnosis not present

## 2021-08-19 DIAGNOSIS — E782 Mixed hyperlipidemia: Secondary | ICD-10-CM

## 2021-08-19 DIAGNOSIS — K7689 Other specified diseases of liver: Secondary | ICD-10-CM | POA: Diagnosis not present

## 2021-08-19 DIAGNOSIS — Z1159 Encounter for screening for other viral diseases: Secondary | ICD-10-CM

## 2021-08-19 DIAGNOSIS — Z0000 Encounter for general adult medical examination without abnormal findings: Secondary | ICD-10-CM

## 2021-08-19 DIAGNOSIS — F411 Generalized anxiety disorder: Secondary | ICD-10-CM

## 2021-08-19 LAB — CBC WITH DIFFERENTIAL/PLATELET
Basophils Absolute: 0 10*3/uL (ref 0.0–0.1)
Basophils Relative: 0.6 % (ref 0.0–3.0)
Eosinophils Absolute: 0.1 10*3/uL (ref 0.0–0.7)
Eosinophils Relative: 1.3 % (ref 0.0–5.0)
HCT: 39.8 % (ref 36.0–46.0)
Hemoglobin: 13.4 g/dL (ref 12.0–15.0)
Lymphocytes Relative: 32.1 % (ref 12.0–46.0)
Lymphs Abs: 1.4 10*3/uL (ref 0.7–4.0)
MCHC: 33.8 g/dL (ref 30.0–36.0)
MCV: 92.1 fl (ref 78.0–100.0)
Monocytes Absolute: 0.3 10*3/uL (ref 0.1–1.0)
Monocytes Relative: 7.6 % (ref 3.0–12.0)
Neutro Abs: 2.5 10*3/uL (ref 1.4–7.7)
Neutrophils Relative %: 58.4 % (ref 43.0–77.0)
Platelets: 215 10*3/uL (ref 150.0–400.0)
RBC: 4.32 Mil/uL (ref 3.87–5.11)
RDW: 12.3 % (ref 11.5–15.5)
WBC: 4.3 10*3/uL (ref 4.0–10.5)

## 2021-08-19 LAB — TSH: TSH: 2.27 u[IU]/mL (ref 0.35–5.50)

## 2021-08-19 LAB — VITAMIN D 25 HYDROXY (VIT D DEFICIENCY, FRACTURES): VITD: 33.82 ng/mL (ref 30.00–100.00)

## 2021-08-19 LAB — HEMOGLOBIN A1C: Hgb A1c MFr Bld: 5.2 % (ref 4.6–6.5)

## 2021-08-19 NOTE — Patient Instructions (Signed)
There was no comment regarding the size of the hepatic cyst noted on breast MRI from October 2022.

## 2021-08-19 NOTE — Progress Notes (Signed)
Subjective:     Tara Yates is a 56 y.o. female and is here for a comprehensive physical exam. The patient reports doing well.  Pt weaned off sertraline 3 wks ago.  States feeling okay.  Has been able to manage the occasional emotional episodes.  Currently taking tamoxifen 10 mg for left breast DCIS.  Was on 20 mg however noticed joint pain.  Endorses increased hot flashes, vaginal discharge, and occasional vaginal bleeding on tamoxifen.  Patient has appointment with OB/GYN today.  Has a breast MRI scheduled in a few months.  Patient mentions a hepatic cyst was noted on breast MRI from 04/2021.  Patient denies RUQ pain.  Social History   Socioeconomic History   Marital status: Divorced    Spouse name: Not on file   Number of children: 2   Years of education: Not on file   Highest education level: Not on file  Occupational History   Not on file  Tobacco Use   Smoking status: Former    Packs/day: 1.00    Years: 29.00    Pack years: 29.00    Types: Cigarettes    Quit date: 2000    Years since quitting: 23.1   Smokeless tobacco: Never   Tobacco comments:    Quit 20 years ago  Vaping Use   Vaping Use: Never used  Substance and Sexual Activity   Alcohol use: Yes    Alcohol/week: 7.0 standard drinks    Types: 7 Glasses of wine per week    Comment: Wine daily   Drug use: Never   Sexual activity: Not Currently    Birth control/protection: Post-menopausal    Comment: ablation  Other Topics Concern   Not on file  Social History Narrative   Not on file   Social Determinants of Health   Financial Resource Strain: Not on file  Food Insecurity: Not on file  Transportation Needs: Not on file  Physical Activity: Not on file  Stress: Not on file  Social Connections: Not on file  Intimate Partner Violence: Not on file   Health Maintenance  Topic Date Due   HIV Screening  Never done   Hepatitis C Screening  Never done   COVID-19 Vaccine (4 - Booster) 10/28/2019    TETANUS/TDAP  03/04/2020   COLONOSCOPY (Pts 45-53yrs Insurance coverage will need to be confirmed)  07/30/2021   MAMMOGRAM  09/20/2022   PAP SMEAR-Modifier  09/11/2023   INFLUENZA VACCINE  Completed   Zoster Vaccines- Shingrix  Completed   HPV VACCINES  Aged Out    The following portions of the patient's history were reviewed and updated as appropriate: allergies, current medications, past family history, past medical history, past social history, past surgical history, and problem list.  Review of Systems Pertinent items noted in HPI and remainder of comprehensive ROS otherwise negative.   Objective:    BP 120/80    Pulse 64    Temp 98.6 F (37 C) (Oral)    Ht 5' 3.5" (1.613 m)    Wt 172 lb (78 kg)    SpO2 96%    BMI 29.99 kg/m  General appearance: alert, cooperative, and no distress Head: Normocephalic, without obvious abnormality, atraumatic Eyes: conjunctivae/corneas clear. PERRL, EOM's intact. Fundi benign. Ears: normal TM's and external ear canals both ears Nose: Nares normal. Septum midline. Mucosa normal. No drainage or sinus tenderness. Throat: lips, mucosa, and tongue normal; teeth and gums normal Lungs: clear to auscultation bilaterally Heart: regular rate and rhythm, S1,  S2 normal, no murmur, click, rub or gallop Abdomen: soft, non-tender; bowel sounds normal; no masses,  no organomegaly Extremities: extremities normal, atraumatic, no cyanosis or edema Pulses: 2+ and symmetric Skin: Skin color, texture, turgor normal. No rashes or lesions Lymph nodes: Cervical, supraclavicular, and axillary nodes normal. Neurologic: Alert and oriented X 3, normal strength and tone. Normal symmetric reflexes. Normal coordination and gait    Assessment:    Healthy female exam.      Plan:    Anticipatory guidance given including wearing seatbelts, smoke detectors in the home, increasing physical activity, increasing p.o. intake of water and vegetables. -Obtain labs -Immunizations  reviewed.  Tdap given this visit. -Mammogram up-to-date.  Breast MRI scheduled in a few months. -3-year recall colonoscopy due.  Patient to call and schedule -Next CPE in 1 year See After Visit Summary for Counseling Recommendations   Need for Tdap vaccination  - Plan: Tdap vaccine greater than or equal to 7yo IM  Encounter for hepatitis C screening test for low risk patient  - Plan: Hep C Antibody  Ductal carcinoma in situ (DCIS) of left breast -Status post lumpectomy 12/2020 for ER and PR positive DCIS left breast. Declined XRT 2/2 ongoing family issues and her schedule. -s/p biopsy of L breast with Atypical lobular hyperplasia noted 07/11/21. -Continue tamoxifen 10 mg daily -Continue follow-up with oncology, Dr. Burr Medico, Surgeon, Dr. Donne Hazel at [facility], and OB/GYN - Plan: CBC with Differential/Platelet, Vitamin D, 25-hydroxy  GAD (generalized anxiety disorder) -Stable off sertraline -Continue to monitor -Continue self-care  - Plan: TSH  Acquired hypothyroidism -Continue Synthroid 25 mcg daily - Plan: TSH  Hepatic cyst -Incidental finding -Breast MRI from 04/23/2021 with a T2 bright round nonenhancing mass in the liver (series 2, image 64), most consistent with a cyst. -We will obtain labs.  Repeat breast MRI scheduled in a few months, if changes in size noted further follow-up advised. - Plan: CMP  Mixed hyperlipidemia -Continue lifestyle modifications  - Plan: Lipid panel  Follow-up as needed  Grier Mitts, MD

## 2021-08-20 LAB — LIPID PANEL
Cholesterol: 203 mg/dL — ABNORMAL HIGH (ref 0–200)
HDL: 55.6 mg/dL (ref >39.00)
LDL Cholesterol: 115 mg/dL — ABNORMAL HIGH (ref 0–99)
NonHDL: 147.09
Total CHOL/HDL Ratio: 4
Triglycerides: 159 mg/dL — ABNORMAL HIGH (ref 0.0–149.0)
VLDL: 31.8 mg/dL (ref 0.0–40.0)

## 2021-08-20 LAB — COMPREHENSIVE METABOLIC PANEL
ALT: 21 U/L (ref 0–35)
AST: 21 U/L (ref 0–37)
Albumin: 4.6 g/dL (ref 3.5–5.2)
Alkaline Phosphatase: 55 U/L (ref 39–117)
BUN: 9 mg/dL (ref 6–23)
CO2: 27 mEq/L (ref 19–32)
Calcium: 9.1 mg/dL (ref 8.4–10.5)
Chloride: 103 mEq/L (ref 96–112)
Creatinine, Ser: 0.66 mg/dL (ref 0.40–1.20)
GFR: 100.31 mL/min (ref >60.00)
Glucose, Bld: 86 mg/dL (ref 70–99)
Potassium: 4.1 mEq/L (ref 3.5–5.1)
Sodium: 140 mEq/L (ref 135–145)
Total Bilirubin: 0.5 mg/dL (ref 0.2–1.2)
Total Protein: 7.1 g/dL (ref 6.0–8.3)

## 2021-08-20 LAB — HEPATITIS C ANTIBODY
Hepatitis C Ab: NONREACTIVE
SIGNAL TO CUT-OFF: <0.02 (ref <1.00)

## 2021-08-28 ENCOUNTER — Encounter: Payer: Self-pay | Admitting: Internal Medicine

## 2021-08-28 ENCOUNTER — Other Ambulatory Visit: Payer: Self-pay | Admitting: General Surgery

## 2021-08-28 DIAGNOSIS — Z9889 Other specified postprocedural states: Secondary | ICD-10-CM

## 2021-09-19 ENCOUNTER — Other Ambulatory Visit: Payer: Self-pay | Admitting: Family Medicine

## 2021-09-20 ENCOUNTER — Ambulatory Visit
Admission: RE | Admit: 2021-09-20 | Discharge: 2021-09-20 | Disposition: A | Discharge status: Home / Self Care | Payer: 59 | Source: Ambulatory Visit | Attending: General Surgery | Admitting: General Surgery

## 2021-09-20 ENCOUNTER — Other Ambulatory Visit: Payer: Self-pay | Admitting: Family Medicine

## 2021-09-20 ENCOUNTER — Other Ambulatory Visit: Payer: Self-pay

## 2021-09-20 DIAGNOSIS — Z9889 Other specified postprocedural states: Secondary | ICD-10-CM

## 2021-09-20 DIAGNOSIS — N6489 Other specified disorders of breast: Secondary | ICD-10-CM

## 2021-09-20 HISTORY — DX: Malignant neoplasm of unspecified site of unspecified female breast: C50.919

## 2021-09-20 IMAGING — MG DIGITAL DIAGNOSTIC BILAT W/ TOMO W/ CAD
6 of 9 series · 6 of 25 positions shown · non-contrast
Comparison: Previous exam(s).

CLINICAL DATA: History of LEFT lumpectomy for ductal carcinoma in
situ in [DATE]. MR guided core biopsy of the LEFT breast
[DATE] showed atypical lobular hyperplasia with sclerosing
adenosis, marked with a dumbbell clip. Follow-up was recommended for
this site of atypical lobular hyperplasia.

EXAM:
DIGITAL DIAGNOSTIC BILATERAL MAMMOGRAM WITH TOMOSYNTHESIS AND CAD
TECHNIQUE: Bilateral digital diagnostic mammography and breast tomosynthesis
was performed. The images were evaluated with computer-aided
detection.

[L CC]
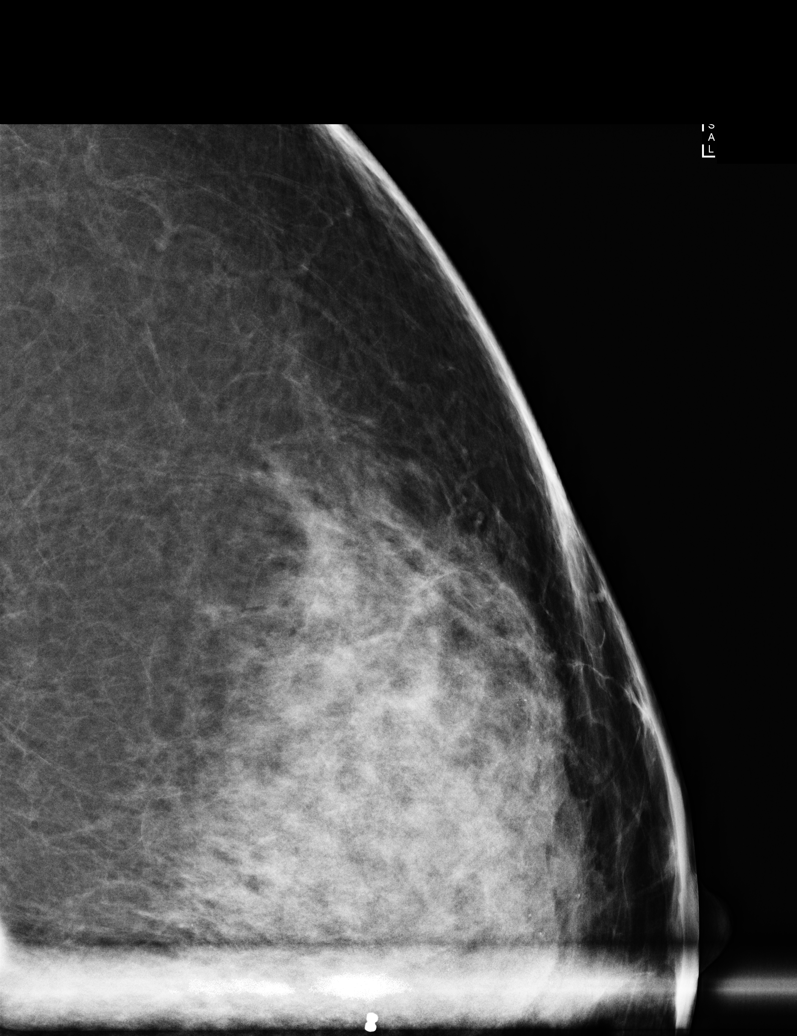

[L CC synth-2D]
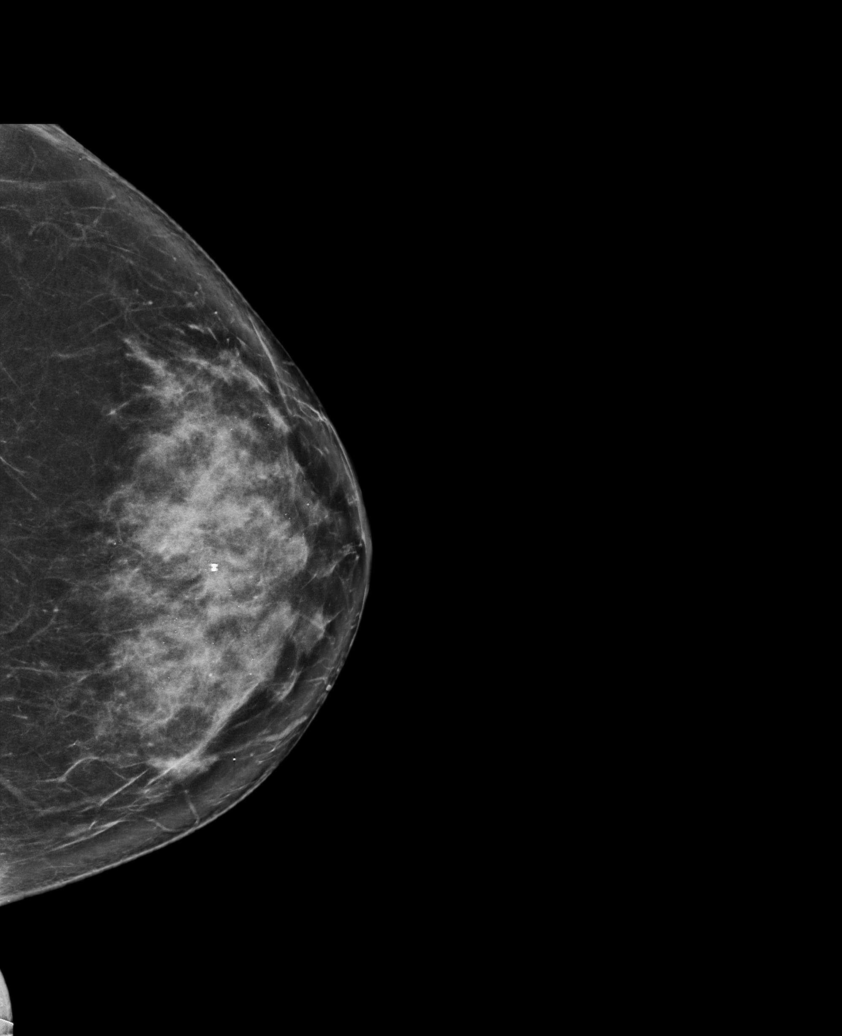

[R MLO synth-2D]
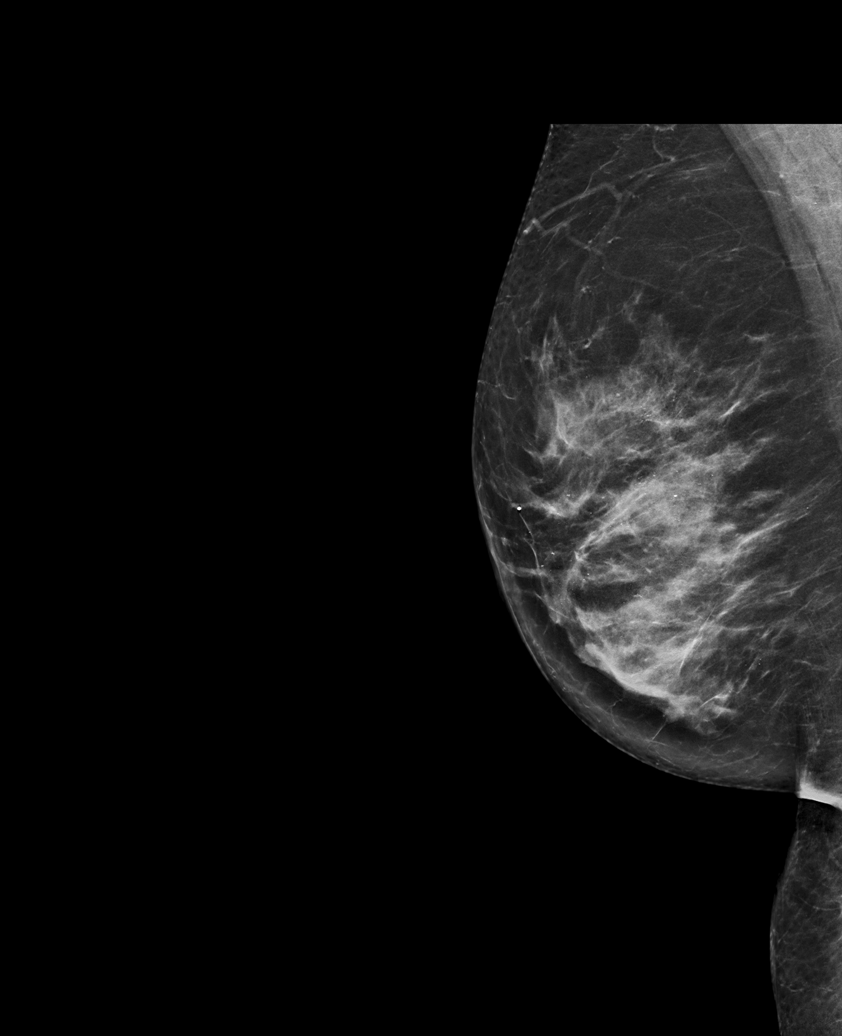

[L MLO synth-2D]
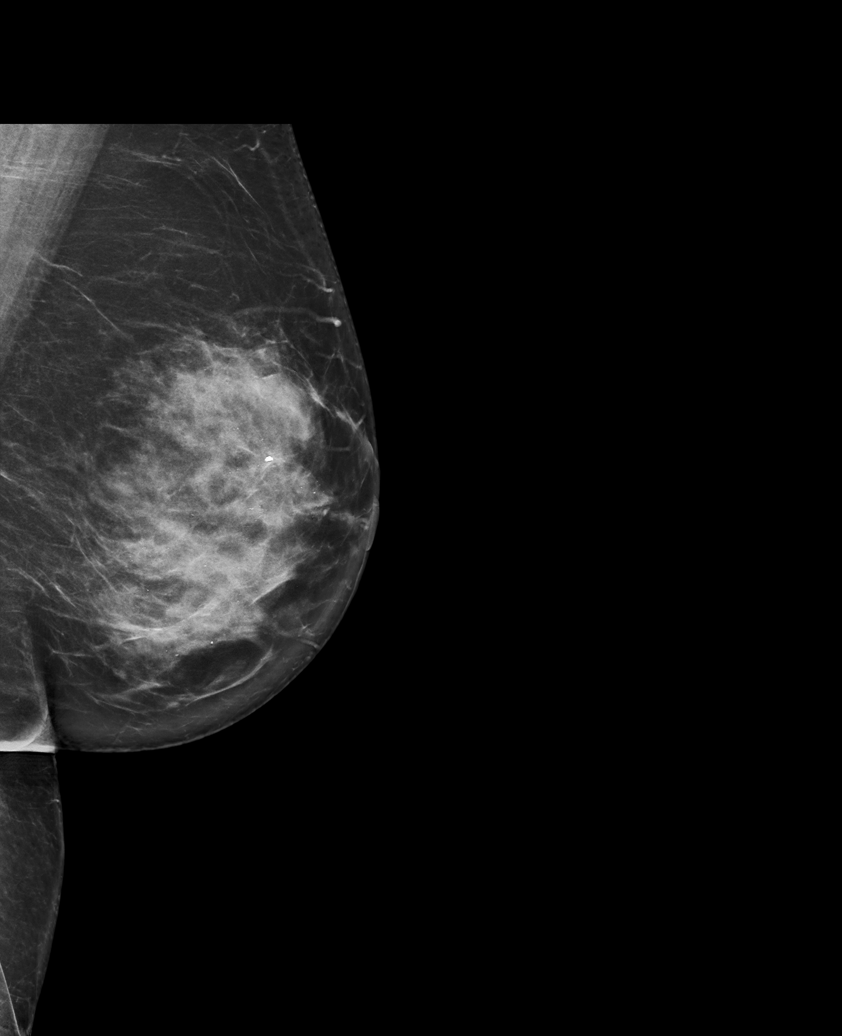

[R CC synth-2D]
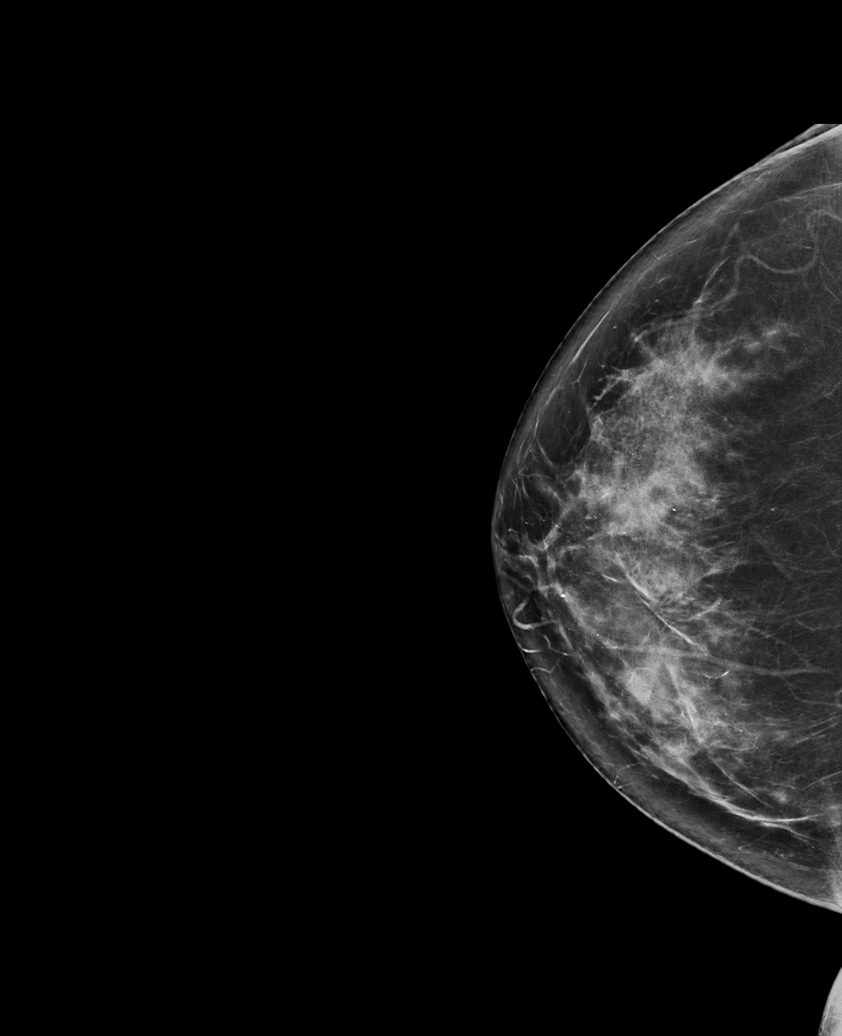

[R CC tomo · tomo slice 39/77.0]
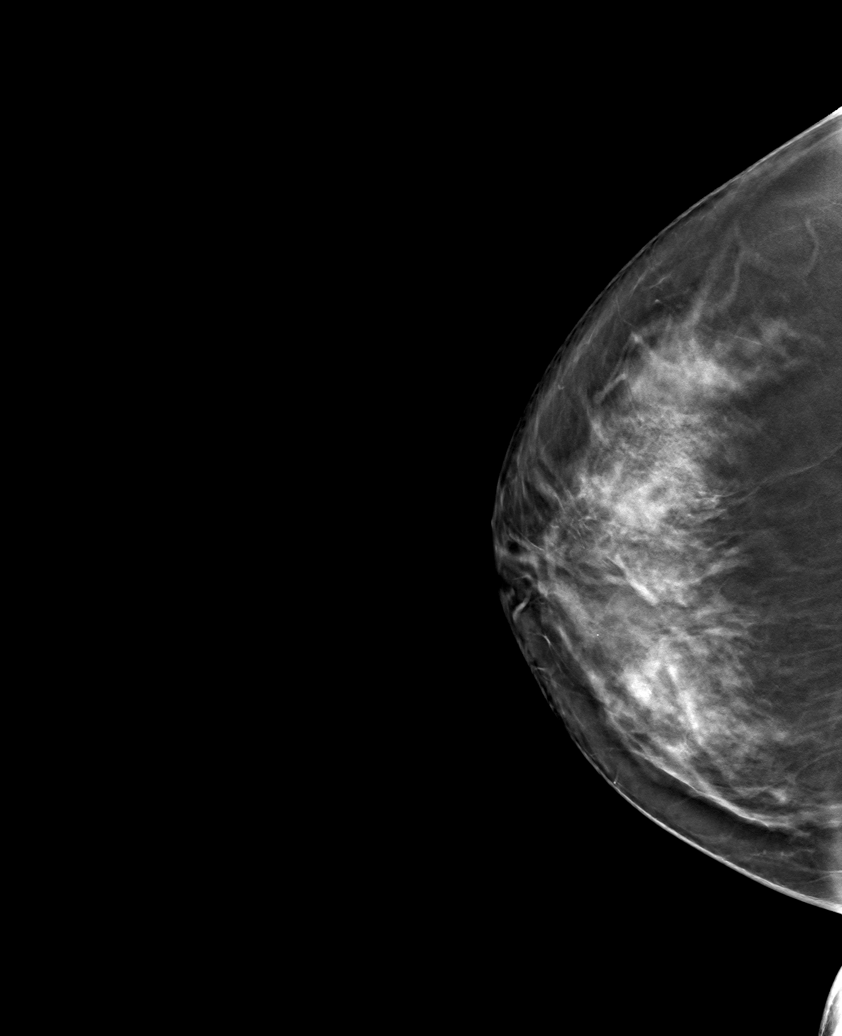

[6 of 25 positions shown; findings below may reference images not displayed]

ACR Breast Density Category c: The breast tissue is heterogeneously
dense, which may obscure small masses.
FINDINGS: RIGHT breast is negative. Postoperative changes are seen in the LEFT
breast. At the site of barbell clip, no suspicious mass identified.
IMPRESSION: No mammographic evidence for malignancy.

RECOMMENDATION:
Recommend LEFT diagnostic mammogram in 6 months to follow atypical
lobular hyperplasia. Patient is also scheduled for bilateral breast
MRI in [DATE]

I have discussed the findings and recommendations with the patient.
If applicable, a reminder letter will be sent to the patient
regarding the next appointment.

BI-RADS CATEGORY  3: Probably benign.

## 2021-09-23 ENCOUNTER — Other Ambulatory Visit: Payer: Self-pay

## 2021-09-23 ENCOUNTER — Encounter: Payer: Self-pay | Admitting: Hematology

## 2021-09-23 ENCOUNTER — Inpatient Hospital Stay: Payer: 59 | Attending: Hematology | Admitting: Hematology

## 2021-09-23 VITALS — BP 123/77 | HR 77 | Temp 98.2°F | Resp 18 | Ht 63.5 in | Wt 174.5 lb

## 2021-09-23 DIAGNOSIS — Z79899 Other long term (current) drug therapy: Secondary | ICD-10-CM | POA: Insufficient documentation

## 2021-09-23 DIAGNOSIS — M2550 Pain in unspecified joint: Secondary | ICD-10-CM | POA: Insufficient documentation

## 2021-09-23 DIAGNOSIS — N6022 Fibroadenosis of left breast: Secondary | ICD-10-CM | POA: Insufficient documentation

## 2021-09-23 DIAGNOSIS — Z79890 Hormone replacement therapy (postmenopausal): Secondary | ICD-10-CM | POA: Diagnosis not present

## 2021-09-23 DIAGNOSIS — F419 Anxiety disorder, unspecified: Secondary | ICD-10-CM | POA: Diagnosis not present

## 2021-09-23 DIAGNOSIS — R5383 Other fatigue: Secondary | ICD-10-CM | POA: Diagnosis not present

## 2021-09-23 DIAGNOSIS — Z803 Family history of malignant neoplasm of breast: Secondary | ICD-10-CM | POA: Insufficient documentation

## 2021-09-23 DIAGNOSIS — E039 Hypothyroidism, unspecified: Secondary | ICD-10-CM | POA: Insufficient documentation

## 2021-09-23 DIAGNOSIS — F32A Depression, unspecified: Secondary | ICD-10-CM | POA: Insufficient documentation

## 2021-09-23 DIAGNOSIS — R232 Flushing: Secondary | ICD-10-CM | POA: Insufficient documentation

## 2021-09-23 DIAGNOSIS — D0512 Intraductal carcinoma in situ of left breast: Secondary | ICD-10-CM

## 2021-09-23 DIAGNOSIS — E785 Hyperlipidemia, unspecified: Secondary | ICD-10-CM | POA: Diagnosis not present

## 2021-09-23 NOTE — Progress Notes (Signed)
?[facility]   ?[phone] [phone]   ?Clinic Follow up Note  ? ?Patient Care Team: ?Billie Ruddy, MD as PCP - General (Family Medicine) ?Mauro Kaufmann, RN as Oncology Nurse Navigator ?Rockwell Germany, RN as Oncology Nurse Navigator ?Rolm Bookbinder, MD as Consulting Physician (General Surgery) ?Truitt Merle, MD as Consulting Physician (Hematology) ?Kyung Rudd, MD as Consulting Physician (Radiation Oncology) ?Alla Feeling, NP as Nurse Practitioner (Nurse Practitioner) ? ?Date of Service:  09/23/2021 ? ?CHIEF COMPLAINT: f/u of left breast DCIS ? ?CURRENT THERAPY:  ?Tamoxifen, 10 mg, started 06/2022 ? ?ASSESSMENT & PLAN:  ?Tara Yates is a 56 y.o. post-menopausal female with  ? ?1. Left breast DCIS, grade 2, ER/PR+ ?-Discovered on screening mammography. Biopsy on 10/16/20 confirmed intermediate grade DCIS, ER/PR+. ?-Left lumpectomy on 12/18/20 again showed grade 2 DCIS with clear margins  ?-She declined adjuvant radiation. ?-she also initially declined antiestrogen therapy but agreed to start tamoxifen at survivorship visit 06/27/21. She transitioned from sertraline and began tamoxifen. She developed joint pain about a month later, and her dose was dropped to 10 mg. She still reports fatigue, manageable joint pain, and hot flashes. While she feels this is tolerable, it is still bothersome to her. We discussed data of low dose tamoxifen in DCIS, and I will decrease her dose further to 5 mg as antiestrogen therapy is a preventative measure for her. ?-she has had two left breast biopsies, on 04/29/21 and 07/11/21, both showing ALH. She continues surveillance with alternating mammography and breast MRI. ?-she is clinically doing fairly well. Labs from 08/19/21 with her PCP reviewed, overall WNL. Physical exam was unremarkable. There is no clinical concern for recurrence. ?  ?2. Negative genetic testing 11/08/20 ?- VUS in CDKN2A ?  ?  ?PLAN:  ?-decrease tamoxifen to 5 mg  daily due to AEs. ?-lab and f/u in 6 months ? ? ? ?No problem-specific Assessment & Plan notes found for this encounter. ? ? ?SUMMARY OF ONCOLOGIC HISTORY: ?Oncology History  ?Ductal carcinoma in situ of left breast  ?10/16/2020 Pathology Results  ? Breast, left, needle core biopsy, UOQ posterior ?- DUCTAL CARCINOMA IN SITU WITH CALCIFICATIONS, INTERMEDIATE NUCLEAR GRADE ? ?PROGNOSTIC INDICATORS ?Results: ?IMMUNOHISTOCHEMICAL AND MORPHOMETRIC ANALYSIS PERFORMED MANUALLY ?Estrogen Receptor: 95%, POSITIVE, STRONG STAINING INTENSITY ?Progesterone Receptor: 20%, POSITIVE, STRONG STAINING INTENSITY ?  ?11/09/2020 Initial Diagnosis  ? Ductal carcinoma in situ of left breast ?  ?11/16/2020 Genetic Testing  ? Negative hereditary cancer genetic testing: no pathogenic variants detected in [facility]CAPl[facility] Panel.  The report date is Nov 16, 2020. The BRCAplus panel offered by [employer] and includes sequencing and deletion/duplication analysis for the following 8 genes: ATM, BRCA1, BRCA2, CDH1, CHEK2, PALB2, PTEN, and TP53.   ? ?Negative hereditary cancer genetic testing: no pathogenic variants detected in [facility] CustomNext-Cancer +RNAinsight Panel.  Variant of uncertain significance detected in CDKN2A at c.440C>T (p.A147V). The report date is Nov 26, 2020.  ? ?The CustomNext-Cancer+RNAinsight panel offered by [facility] includes sequencing and rearrangement analysis for the following 47 genes:  APC, ATM, AXIN2, BARD1, BMPR1A, BRCA1, BRCA2, BRIP1, CDH1, CDK4, CDKN2A, CHEK2, DICER1, EPCAM, GREM1, HOXB13, MEN1, MLH1, MSH2, MSH3, MSH6, MUTYH, NBN, NF1, NF2, NTHL1, PALB2, PMS2, POLD1, POLE, PTEN, RAD51C, RAD51D, RECQL, RET, SDHA, SDHAF2, SDHB, SDHC, SDHD, SMAD4, SMARCA4, STK11, TP53, TSC1, TSC2, and VHL.  RNA data is routinely analyzed for use in variant interpretation for all genes. ? ?  ?12/18/2020 Surgery  ? Left Lumpectomy ? ?FINAL MICROSCOPIC DIAGNOSIS:  ? ?  A. BREAST, LEFT, LUMPECTOMY:  ?- Ductal carcinoma in situ,  intermediate grade, involving areas of  ?sclerosing adenosis, see comment  ?- Resection margins are negative for DCIS; closest is the posterior  ?margin at 3 mm  ?- Biopsy site changes  ?- Calcifications  ?- See oncology table  ? ?B. BREAST, LEFT ADDITIONAL LATERAL MARGIN, EXCISION:  ?- Fibrocystic change with sclerosing adenosis and calcifications  ?- Negative for carcinoma  ? ?C. BREAST, LEFT ADDITIONAL POSTERIOR MARGIN, EXCISION:  ?- Benign breast parenchyma, negative for carcinoma  ?  ?12/18/2020 Cancer Staging  ? Staging form: Breast, AJCC 8th Edition ?- Pathologic stage from 12/18/2020: Stage Unknown (pTis (DCIS), pNX, cM0, G2, ER+, PR+, HER2: Not Assessed) - Signed by Alla Feeling, NP on 06/27/2021 ?Stage prefix: Initial diagnosis ?Histologic grading system: 3 grade system ? ?  ?06/27/2021 Survivorship  ? SCP delivered by Cira Rue, NP ?  ?07/2021 -  Anti-estrogen oral therapy  ? Pending start of chemoprevention with tamoxifen in 07/2021  ?  ? ? ? ?INTERVAL HISTORY:  ?Tara Yates is here for a follow up of breast cancer. She was last seen by me on 12/27/20 in consultation with survivorship visit in the interim. She presents to the clinic accompanied by [name]. ?She reports she is still having difficulty even with low dose tamoxifen-- she reports fatigue, continued but more manageable joint pain. ?  ?All other systems were reviewed with the patient and are negative. ? ?MEDICAL HISTORY:  ?Past Medical History:  ?Diagnosis Date  ? Allergy   ? Anxiety   ? Back pain   ? Breast cancer ([place])   ? Constipation   ? Depression   ? Ductal carcinoma in situ of left breast 11/09/2020  ? Family history of breast cancer 11/09/2020  ? HSV (herpes simplex virus) anogenital infection   ? Hyperlipidemia   ? Hypothyroidism   ? Joint pain   ? Plantar fasciitis, bilateral   ? SOBOE (shortness of breath on exertion)   ? ? ?SURGICAL HISTORY: ?Past Surgical History:  ?Procedure Laterality Date  ? BREAST BIOPSY Right  02/28/2015  ? BREAST BIOPSY Left 07/11/2021  ? BREAST EXCISIONAL BIOPSY Right 03/29/2015  ? had seed placement as wel  ? BREAST LUMPECTOMY WITH RADIOACTIVE SEED LOCALIZATION Left 12/18/2020  ? Procedure: LEFT BREAST LUMPECTOMY WITH RADIOACTIVE SEED LOCALIZATION;  Surgeon: Rolm Bookbinder, MD;  Location: [facility];  Service: General;  Laterality: Left;  ? ENDOMETRIAL ABLATION    ? MOUTH SURGERY    ? RADIOACTIVE SEED GUIDED EXCISIONAL BREAST BIOPSY Right 04/03/2015  ? Procedure: RADIOACTIVE SEED GUIDED EXCISIONAL BREAST BIOPSY;  Surgeon: Rolm Bookbinder, MD;  Location: [facility];  Service: General;  Laterality: Right;  ? ? ?I have reviewed the social history and family history with the patient and they are unchanged from previous note. ? ?ALLERGIES:  has No Known Allergies. ? ?MEDICATIONS:  ?Current Outpatient Medications  ?Medication Sig Dispense Refill  ? acidophilus (RISAQUAD) CAPS capsule Take by mouth daily.    ? acyclovir ointment (ZOVIRAX) 5 % Apply 1 application topically every 3 (three) hours.    ? b complex vitamins capsule Take 1 capsule by mouth daily.    ? Biotin 10000 MCG TABS Take by mouth.    ? cholecalciferol (VITAMIN D3) 25 MCG (1000 UNIT) tablet Take 1,000 Units by mouth daily.    ? Collagen-Vitamin C-Biotin (COLLAGEN 1500/C PO) Take by mouth.    ? levothyroxine (SYNTHROID) 25 MCG tablet TAKE 1  TABLET(25 MCG) BY MOUTH DAILY BEFORE BREAKFAST 90 tablet 1  ? Loratadine 10 MG CHEW Chew 10 mg by mouth daily as needed.    ? polyethylene glycol (MIRALAX / GLYCOLAX) 17 g packet Take 17 g by mouth daily.    ? tamoxifen (NOLVADEX) 10 MG tablet Take 1 tablet (10 mg total) by mouth daily. 90 tablet 0  ? Turmeric 500 MG TABS Take by mouth.    ? vitamin E 1000 UNIT capsule Take 1,000 Units by mouth daily.    ? ?No current facility-administered medications for this visit.  ? ? ?PHYSICAL EXAMINATION: ?ECOG PERFORMANCE STATUS: 1 - Symptomatic but completely ambulatory ? ?Vitals:   ? 09/23/21 0841  ?BP: 123/77  ?Pulse: 77  ?Resp: 18  ?Temp: 98.2 ?F (36.8 ?C)  ?SpO2: 97%  ? ?Wt Readings from Last 3 Encounters:  ?09/23/21 174 lb 8 oz (79.2 kg)  ?08/19/21 172 lb (78 kg)  ?06/27/21 174 lb 9 oz (79.2 k

## 2021-09-24 ENCOUNTER — Other Ambulatory Visit: Payer: Self-pay

## 2021-09-24 ENCOUNTER — Ambulatory Visit (AMBULATORY_SURGERY_CENTER): Payer: 59 | Admitting: *Deleted

## 2021-09-24 VITALS — Ht 63.5 in | Wt 174.0 lb

## 2021-09-24 DIAGNOSIS — Z86010 Personal history of colonic polyps: Secondary | ICD-10-CM

## 2021-09-24 MED ORDER — NA SULFATE-K SULFATE-MG SULF 17.5-3.13-1.6 GM/177ML PO SOLN: 1.0000 | ORAL | 0 refills | Status: DC

## 2021-09-24 NOTE — Addendum Note (Signed)
Addended by: Estella Husk on: 09/24/2021 02:32 PM ? ? Modules accepted: Orders ? ?

## 2021-09-24 NOTE — Progress Notes (Signed)

## 2021-10-04 ENCOUNTER — Encounter: Payer: Self-pay | Admitting: Internal Medicine

## 2021-10-08 ENCOUNTER — Ambulatory Visit (AMBULATORY_SURGERY_CENTER): Payer: 59 | Admitting: Internal Medicine

## 2021-10-08 ENCOUNTER — Encounter: Payer: Self-pay | Admitting: Internal Medicine

## 2021-10-08 VITALS — BP 106/54 | HR 66 | Temp 97.8°F | Resp 17 | Ht 63.5 in | Wt 174.0 lb

## 2021-10-08 DIAGNOSIS — D120 Benign neoplasm of cecum: Secondary | ICD-10-CM | POA: Diagnosis not present

## 2021-10-08 DIAGNOSIS — Z86010 Personal history of colonic polyps: Secondary | ICD-10-CM | POA: Diagnosis present

## 2021-10-08 MED ORDER — SODIUM CHLORIDE 0.9 % IV SOLN: 500.0000 mL | Freq: Once | INTRAVENOUS | Status: DC

## 2021-10-08 NOTE — Op Note (Signed)
[place] ?Patient Name: Tara Yates ?Procedure Date: 10/08/2021 3:37 PM ?MRN: [id number] ?Endoscopist: Docia Chuck. Henrene Pastor , MD ?Age: 56 ?Referring MD:  ?Date of Birth: 1965/12/15 ?Gender: Female ?Account #: [id number] ?Procedure:                Colonoscopy with cold snare polypectomy x 1 ?Indications:              High risk colon cancer surveillance: Personal  ?                          history of multiple (3 or more) adenomas. Index  ?                          examination January 2020 ?Medicines:                Monitored Anesthesia Care ?Procedure:                Pre-Anesthesia Assessment: ?                          - Prior to the procedure, a History and Physical  ?                          was performed, and patient medications and  ?                          allergies were reviewed. The patient's tolerance of  ?                          previous anesthesia was also reviewed. The risks  ?                          and benefits of the procedure and the sedation  ?                          options and risks were discussed with the patient.  ?                          All questions were answered, and informed consent  ?                          was obtained. Prior Anticoagulants: The patient has  ?                          taken no previous anticoagulant or antiplatelet  ?                          agents. ASA Grade Assessment: II - A patient with  ?                          mild systemic disease. After reviewing the risks  ?                          and benefits, the patient was deemed in  ?  satisfactory condition to undergo the procedure. ?                          After obtaining informed consent, the colonoscope  ?                          was passed under direct vision. Throughout the  ?                          procedure, the patient's blood pressure, pulse, and  ?                          oxygen saturations were monitored continuously. The  ?                          CF [id number]  #[id number] was introduced through the anus  ?                          and advanced to the the cecum, identified by  ?                          appendiceal orifice and ileocecal valve. The  ?                          ileocecal valve, appendiceal orifice, and rectum  ?                          were photographed. The quality of the bowel  ?                          preparation was excellent. The colonoscopy was  ?                          performed without difficulty. The patient tolerated  ?                          the procedure well. The bowel preparation used was  ?                          SUPREP via split dose instruction. ?Scope In: 3:54:11 PM ?Scope Out: 4:10:22 PM ?Scope Withdrawal Time: 0 hours 11 minutes 50 seconds  ?Total Procedure Duration: 0 hours 16 minutes 11 seconds  ?Findings:                 A 3 mm polyp was found in the cecum. The polyp was  ?                          sessile. The polyp was removed with a cold snare.  ?                          Resection and retrieval were complete. ?                          Diverticula were found in the sigmoid  colon and  ?                          ascending colon. ?                          Internal hemorrhoids were found during  ?                          retroflexion. The hemorrhoids were small. ?                          The exam was otherwise without abnormality on  ?                          direct and retroflexion views. ?Complications:            No immediate complications. Estimated blood loss:  ?                          None. ?Estimated Blood Loss:     Estimated blood loss: none. ?Impression:               - One 3 mm polyp in the cecum, removed with a cold  ?                          snare. Resected and retrieved. ?                          - Diverticulosis in the sigmoid colon and in the  ?                          ascending colon. ?                          - Internal hemorrhoids. ?                          - The examination was otherwise normal on  direct  ?                          and retroflexion views. ?Recommendation:           - Repeat colonoscopy in 5 years for surveillance  ?                          (personal history of multiple adenomatous polyps). ?                          - Patient has a contact number available for  ?                          emergencies. The signs and symptoms of potential  ?                          delayed complications were discussed with the  ?  patient. Return to normal activities tomorrow.  ?                          Written discharge instructions were provided to the  ?                          patient. ?                          - Resume previous diet. ?                          - Continue present medications. ?                          - Await pathology results. ?Docia Chuck. Henrene Pastor, MD ?10/08/2021 4:14:34 PM ?This report has been signed electronically. ?

## 2021-10-08 NOTE — Progress Notes (Signed)
Pt's states no medical or surgical changes since previsit or office visit. 

## 2021-10-08 NOTE — Patient Instructions (Signed)
?  Handouts on Diverticulosis, Hemorrhoids and Polyps given. ? ?YOU HAD AN ENDOSCOPIC PROCEDURE TODAY AT [facility]:   Refer to the procedure report that was given to you for any specific questions about what was found during the examination.  If the procedure report does not answer your questions, please call your gastroenterologist to clarify.  If you requested that your care partner not be given the details of your procedure findings, then the procedure report has been included in a sealed envelope for you to review at your convenience later. ? ?YOU SHOULD EXPECT: Some feelings of bloating in the abdomen. Passage of more gas than usual.  Walking can help get rid of the air that was put into your GI tract during the procedure and reduce the bloating. If you had a lower endoscopy (such as a colonoscopy or flexible sigmoidoscopy) you may notice spotting of blood in your stool or on the toilet paper. If you underwent a bowel prep for your procedure, you may not have a normal bowel movement for a few days. ? ?Please Note:  You might notice some irritation and congestion in your nose or some drainage.  This is from the oxygen used during your procedure.  There is no need for concern and it should clear up in a day or so. ? ?SYMPTOMS TO REPORT IMMEDIATELY: ? ?Following lower endoscopy (colonoscopy or flexible sigmoidoscopy): ? Excessive amounts of blood in the stool ? Significant tenderness or worsening of abdominal pains ? Swelling of the abdomen that is new, acute ? Fever of 100?F or higher ? ? ?For urgent or emergent issues, a gastroenterologist can be reached at any hour by calling [phone]. ?Do not use MyChart messaging for urgent concerns.  ? ? ?DIET:  We do recommend a small meal at first, but then you may proceed to your regular diet.  Drink plenty of fluids but you should avoid alcoholic beverages for 24 hours. ? ?ACTIVITY:  You should plan to take it easy for the rest of today and you  should NOT DRIVE or use heavy machinery until tomorrow (because of the sedation medicines used during the test).   ? ?FOLLOW UP: ?Our staff will call the number listed on your records 48-72 hours following your procedure to check on you and address any questions or concerns that you may have regarding the information given to you following your procedure. If we do not reach you, we will leave a message.  We will attempt to reach you two times.  During this call, we will ask if you have developed any symptoms of COVID 19. If you develop any symptoms (ie: fever, flu-like symptoms, shortness of breath, cough etc.) before then, please call [phone].  If you test positive for Covid 19 in the 2 weeks post procedure, please call and report this information to [place].   ? ?If any biopsies were taken you will be contacted by phone or by letter within the next 1-3 weeks.  Please call us at [phone] if you have not heard about the biopsies in 3 weeks.  ? ? ?SIGNATURES/CONFIDENTIALITY: ?You and/or your care partner have signed paperwork which will be entered into your electronic medical record.  These signatures attest to the fact that that the information above on your After Visit Summary has been reviewed and is understood.  Full responsibility of the confidentiality of this discharge information lies with you and/or your care-partner.  ?

## 2021-10-08 NOTE — Progress Notes (Signed)
Report to PACU, RN, vss, BBS= Clear.  

## 2021-10-08 NOTE — Progress Notes (Signed)
Called to room to assist during endoscopic procedure.  Patient ID and intended procedure confirmed with present staff. Received instructions for my participation in the procedure from the performing physician.  

## 2021-10-10 ENCOUNTER — Telehealth: Payer: Self-pay | Admitting: *Deleted

## 2021-10-10 NOTE — Telephone Encounter (Signed)
?  Follow up Call- ? ? ?  10/08/2021  ?  3:02 PM  ?Call back number  ?Post procedure Call Back phone  # [phone]  ?Permission to leave phone message Yes  ?  ? ?Patient questions: ? ?Do you have a fever, pain , or abdominal swelling? No. ?Pain Score  0 * ? ?Have you tolerated food without any problems? Yes.   ? ?Have you been able to return to your normal activities? Yes.   ? ?Do you have any questions about your discharge instructions: ?Diet   No. ?Medications  No. ?Follow up visit  No. ? ?Do you have questions or concerns about your Care? No. ? ?Actions: ?* If pain score is 4 or above: ?No action needed, pain <4. ? ? ?

## 2021-10-14 ENCOUNTER — Encounter: Payer: Self-pay | Admitting: Internal Medicine

## 2021-10-15 ENCOUNTER — Encounter (HOSPITAL_COMMUNITY): Payer: Self-pay

## 2021-10-27 ENCOUNTER — Other Ambulatory Visit: Payer: Self-pay | Admitting: Hematology

## 2021-12-05 ENCOUNTER — Other Ambulatory Visit: Payer: Self-pay | Admitting: Family Medicine

## 2021-12-05 DIAGNOSIS — Z853 Personal history of malignant neoplasm of breast: Secondary | ICD-10-CM

## 2021-12-05 DIAGNOSIS — R922 Inconclusive mammogram: Secondary | ICD-10-CM

## 2021-12-19 ENCOUNTER — Ambulatory Visit
Admission: RE | Admit: 2021-12-19 | Discharge: 2021-12-19 | Disposition: A | Discharge status: Home / Self Care | Payer: 59 | Source: Ambulatory Visit | Attending: Family Medicine | Admitting: Family Medicine

## 2021-12-19 DIAGNOSIS — Z853 Personal history of malignant neoplasm of breast: Secondary | ICD-10-CM

## 2021-12-19 DIAGNOSIS — R922 Inconclusive mammogram: Secondary | ICD-10-CM

## 2021-12-19 IMAGING — MR MR BREAST BILAT WO/W CM
8 of 13 series · 32 of 48 positions shown · IV contrast (gadavist)
Comparison: Prior exams including previous breast MRIs, most recent
dated [DATE].

CLINICAL DATA: Left breast [REDACTED]dense breast tissue with
family hx of Breast CA, paternal grandmother at age 47, pre
menopausal Pt not taking estrogen, Hx of lumpectomy of right breast,
benign and lumpectomy of left breast for CA

EXAM:
BILATERAL BREAST MRI WITH AND WITHOUT CONTRAST
TECHNIQUE: Multiplanar, multisequence MR images of both breasts were obtained
prior to and following the intravenous administration of 8 ml of
Gadavist

[Series 2: t2_tirm_tra ipat (a-p) · axial · 3.0mm · 0.78mm/px · 1 of 60 slices shown]
[im 1/60]
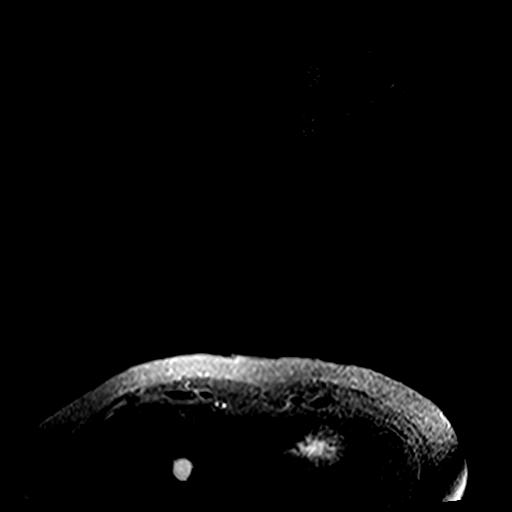

[Series 3: fl3d pre-cm no · axial · non-contrast · 1.2mm · 1.04mm/px · z∈[-95,+77]mm · 5 of 144 slices shown]
[im 1/144]
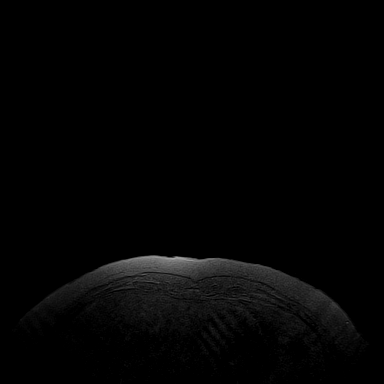
[im 36/144]
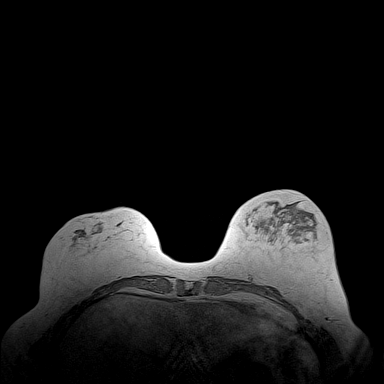
[im 72/144]
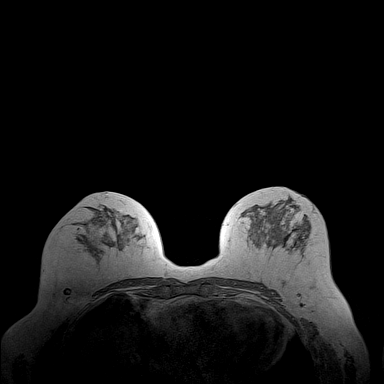
[im 108/144]
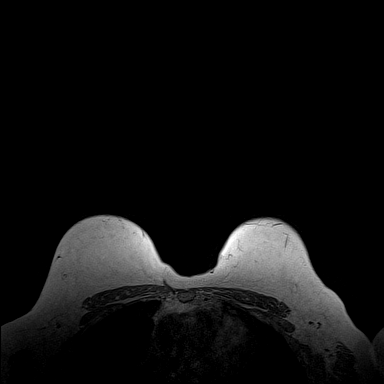
[im 144/144]
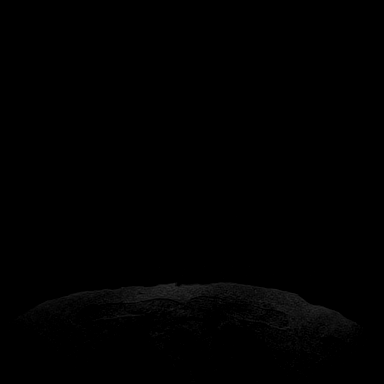

[Series 4: fl3d pre-cm · axial · non-contrast · 1.2mm · 1.04mm/px · z∈[-95,+77]mm · 5 of 144 slices shown]
[im 1/144]
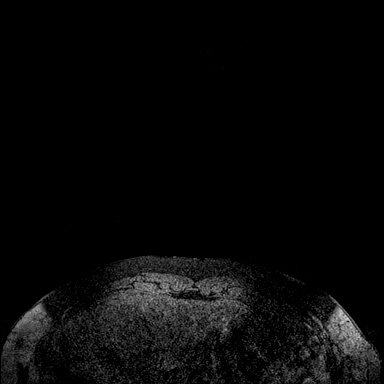
[im 36/144]
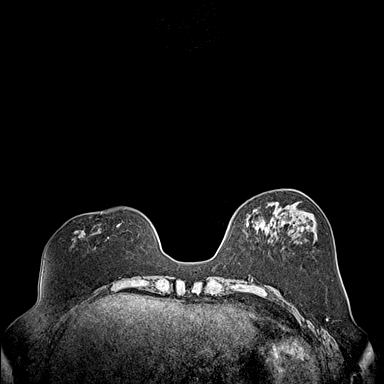
[im 72/144]
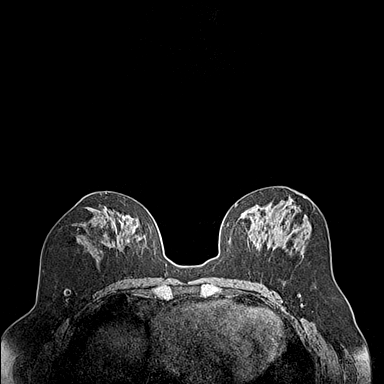
[im 108/144]
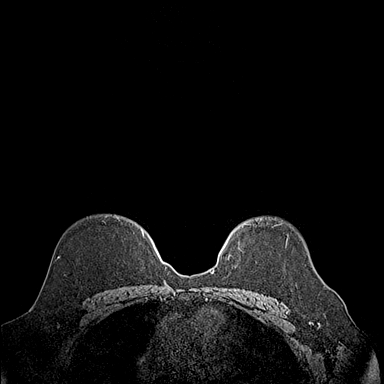
[im 144/144]
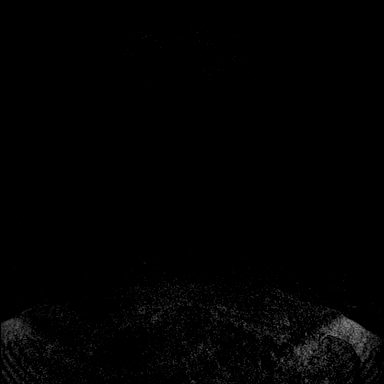

[Series 5: fl3d post-cm 20 · axial · 1.2mm · 1.04mm/px · z∈[-95,+77]mm · 5 of 144 slices shown (1 of 3)]
[im 1/144]
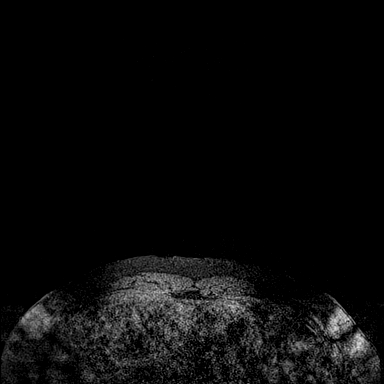
[im 36/144]
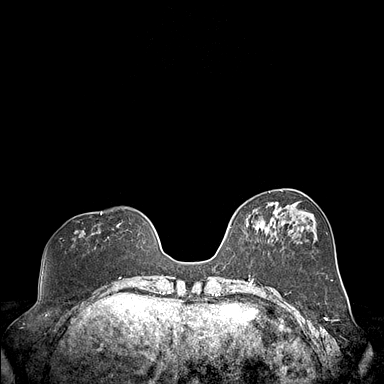
[im 72/144]
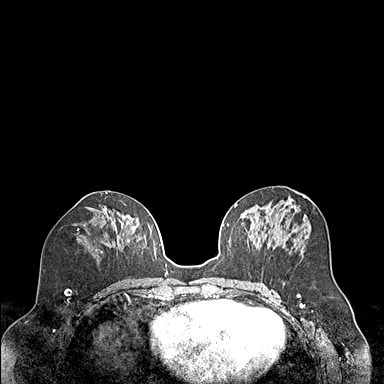
[im 108/144]
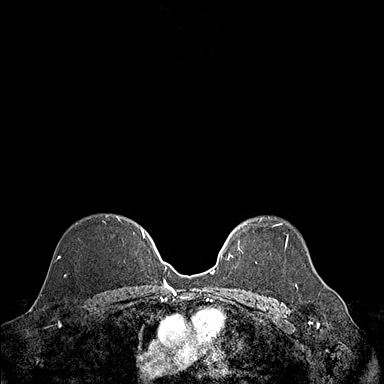
[im 144/144]
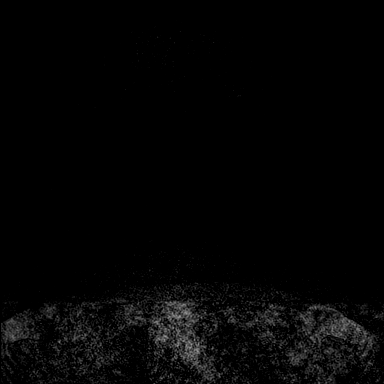

[Series 6: fl3d post-cm 20 · axial · 1.2mm · 1.04mm/px · z∈[-95,+77]mm · 5 of 144 slices shown (2 of 3)]
[im 1/144]
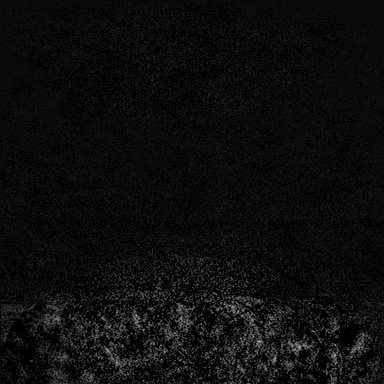
[im 36/144]
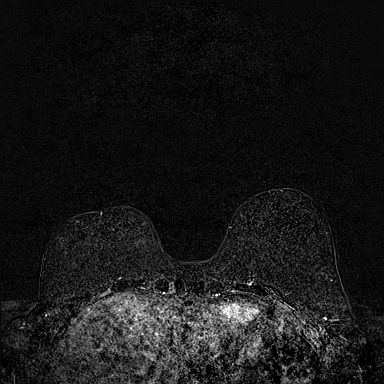
[im 72/144]
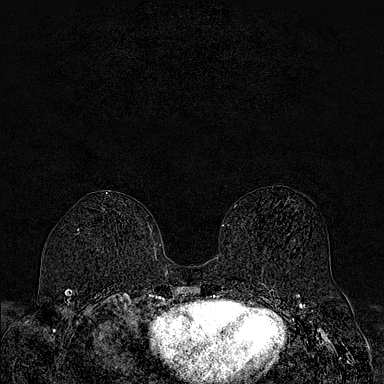
[im 108/144]
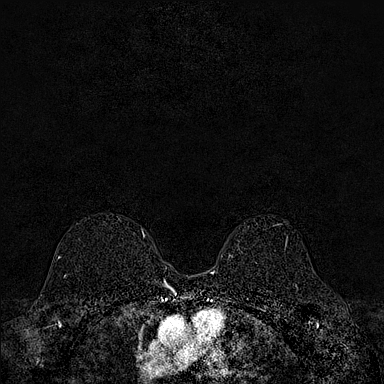
[im 144/144]
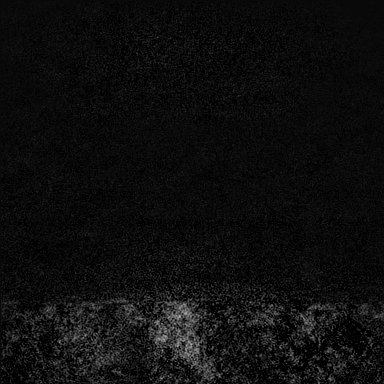

[Series 7: fl3d post-cm 20 · axial · 172.8mm · 1.04mm/px · 1 of 1 slices shown (3 of 3)]
[im 1/1]
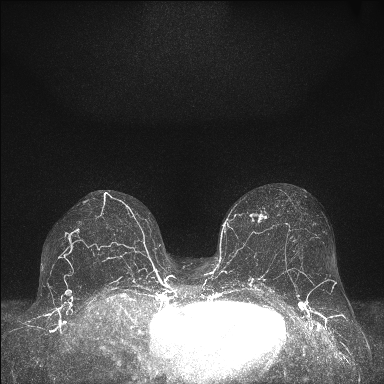

[Series 8: fl3d post-cm 3 · axial · 1.2mm · 1.04mm/px · z∈[-95,+77]mm · 5 of 144 slices shown (1 of 2)]
[im 1/144]
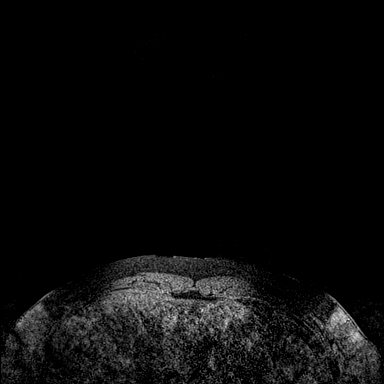
[im 36/144]
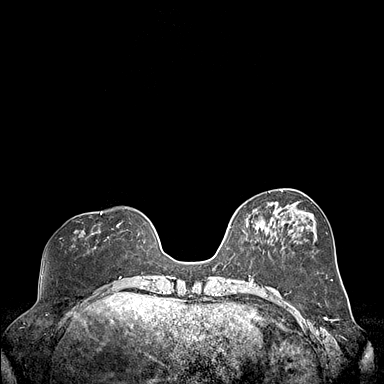
[im 72/144]
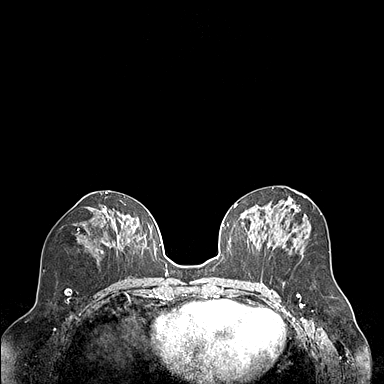
[im 108/144]
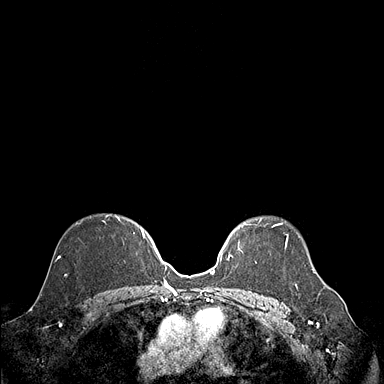
[im 144/144]
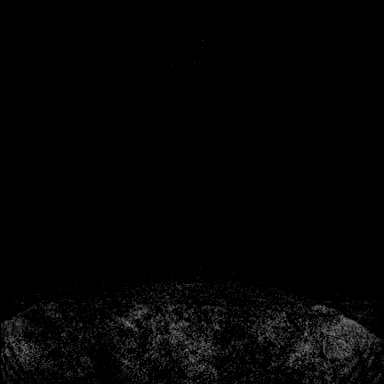

[Series 9: fl3d post-cm 3 · axial · 1.2mm · 1.04mm/px · z∈[-95,+42]mm · 5 of 144 slices shown (2 of 2)]
[im 1/144]
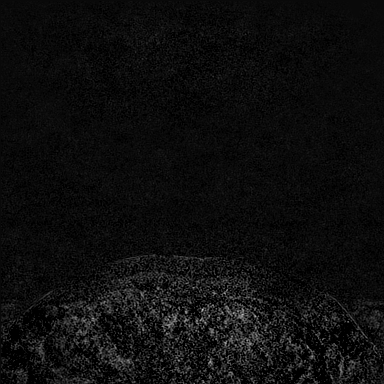
[im 29/144]
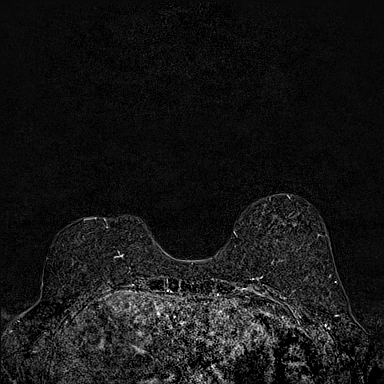
[im 58/144]
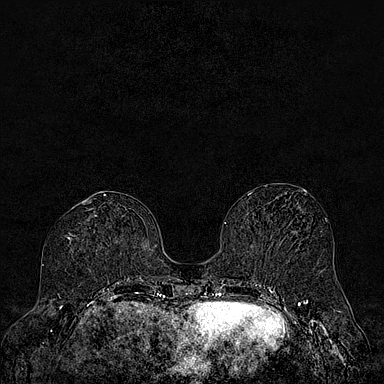
[im 86/144]
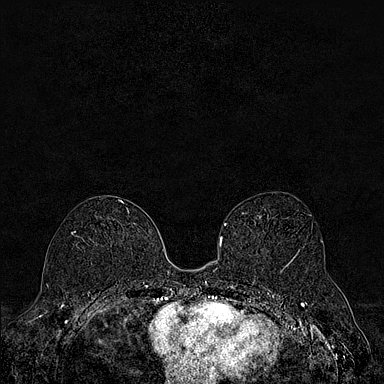
[im 115/144]
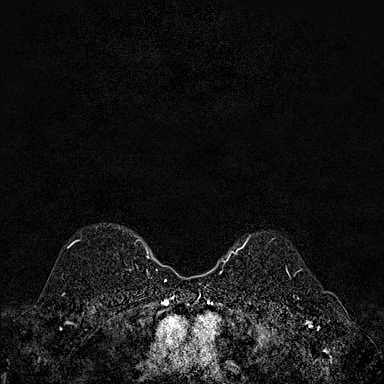

[32 of 48 positions shown; findings below may reference images not displayed]

Three-dimensional MR images were rendered by post-processing of the
original MR data on an independent workstation. The
three-dimensional MR images were interpreted, and findings are
reported in the following complete MRI report for this study. Three
dimensional images were evaluated at the independent interpreting
workstation using the DynaCAD thin client.
FINDINGS: Breast composition: c. Heterogeneous fibroglandular tissue.

Background parenchymal enhancement: Mild

Right breast: No mass or abnormal enhancement.

Left breast: There is clumped non mass enhancement in the upper
inner breast, middle to anterior depth, just below artifact from
cylinder shaped biopsy clip from the previous MRI guided core needle
biopsy. This is centered on image 67, series 9. This non mass
enhancement now spans 2.1 x 1.3 cm anterior-posterior x right to
left and 1.4 cm superior to inferior. Previously this was measured
as 1.6 x 0.6 x 1.1 cm.

There are no other areas of abnormal enhancement. There are no
masses. Stable post surgical changes are noted outer left breast.

Lymph nodes: No abnormal appearing lymph nodes.

Ancillary findings: Oval, homogeneous T2 hyperintense lesion in the
liver, stable consistent with a cyst.
IMPRESSION: 1. Area of non mass enhancement in the left breast has increased in
size from the prior MRI. It lies just below cylinder shaped biopsy
clip. Given the position of the post biopsy marker clip and the
interval increase in size of this enhancement, re-biopsy is
recommended.
2. No other areas of abnormal enhancement in the left breast.
3. No evidence of right breast malignancy.

RECOMMENDATION:
1. MRI guided core needle biopsy of the area of non mass enhancement
in the left breast.

BI-RADS CATEGORY  4: Suspicious.

## 2021-12-19 MED ORDER — GADOBUTROL 1 MMOL/ML IV SOLN
8.0000 mL | Freq: Once | INTRAVENOUS | Status: AC | PRN
  Administered 2021-12-19: 8 mL via INTRAVENOUS

## 2021-12-20 ENCOUNTER — Other Ambulatory Visit: Payer: Self-pay | Admitting: Family Medicine

## 2021-12-20 DIAGNOSIS — R928 Other abnormal and inconclusive findings on diagnostic imaging of breast: Secondary | ICD-10-CM

## 2021-12-27 ENCOUNTER — Ambulatory Visit
Admission: RE | Admit: 2021-12-27 | Discharge: 2021-12-27 | Disposition: A | Discharge status: Home / Self Care | Payer: 59 | Source: Ambulatory Visit | Attending: Family Medicine | Admitting: Family Medicine

## 2021-12-27 DIAGNOSIS — R928 Other abnormal and inconclusive findings on diagnostic imaging of breast: Secondary | ICD-10-CM

## 2021-12-27 IMAGING — MR MR BREAST BX W LOC DEV 1ST LESION IMAGE BX SPEC MR GUIDE*L*
8 of 10 series · 35 of 48 positions shown · IV contrast (gadavist)
Comparison: Previous exams, most recently Breast MRI [DATE] and
mammography [DATE].

CLINICAL DATA: 55-year-old with a personal history of malignant
lumpectomy of the UPPER OUTER QUADRANT of the LEFT breast in [DATE], pathology intermediate grade DCIS. Prior MRI guided core
needle biopsy in [DATE] (and repeat biopsy in [DATE])
demonstrated lobular neoplasia (ALH) involving the UPPER INNER
QUADRANT (hourglass clip). Recent high risk supplemental MRI
demonstrated increasing indeterminate non-mass enhancement in the
UPPER INNER QUADRANT.

EXAM:
MRI GUIDED CORE NEEDLE BIOPSY OF THE LEFT BREAST
TECHNIQUE: Multiplanar, multisequence MR imaging of the LEFT breast was
performed both before and after administration of intravenous
contrast.
CONTRAST:  8mL GADAVIST GADOBUTROL 1 MMOL/ML IV SOLN

[Series 2: fiducial unilateral · sagittal · 2.0mm · 1.33mm/px · 3 of 52 slices shown]
[im 1/52]
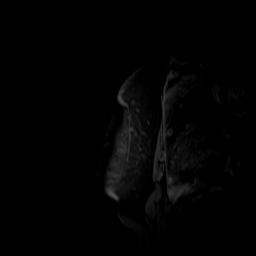
[im 26/52]
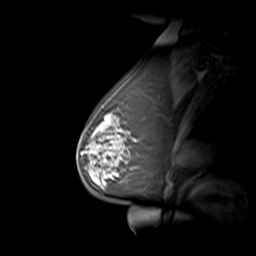
[im 52/52]
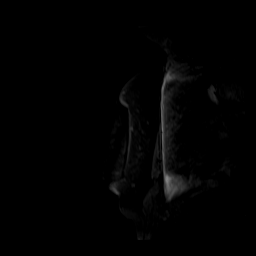

[Series 3: dynamic pre · axial · non-contrast · 1.3mm · 0.73mm/px · z∈[-109,+98]mm · 5 of 160 slices shown]
[im 1/160]
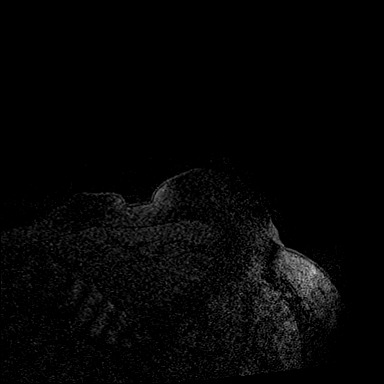
[im 40/160]
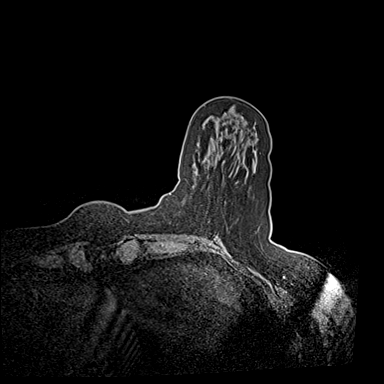
[im 80/160]
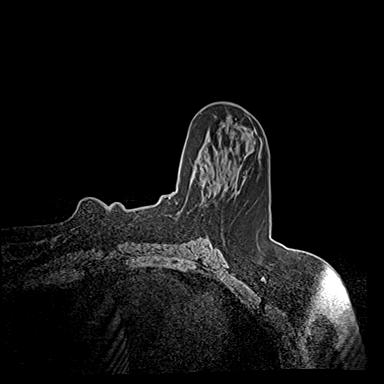
[im 120/160]
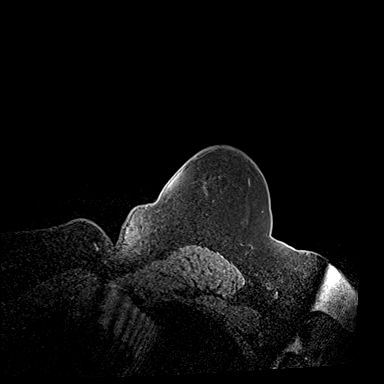
[im 160/160]
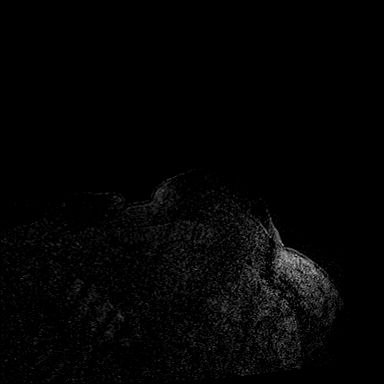

[Series 4: dynamic post 20 · axial · 1.3mm · 0.73mm/px · z∈[-109,+98]mm · 5 of 160 slices shown (1 of 2)]
[im 1/160]
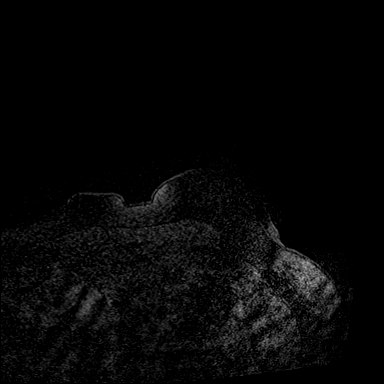
[im 40/160]
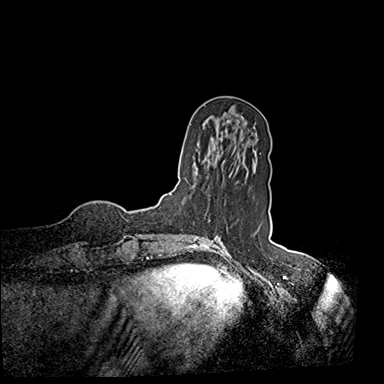
[im 80/160]
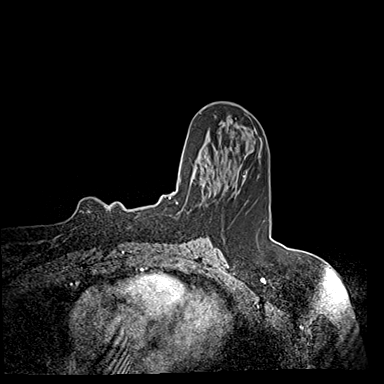
[im 120/160]
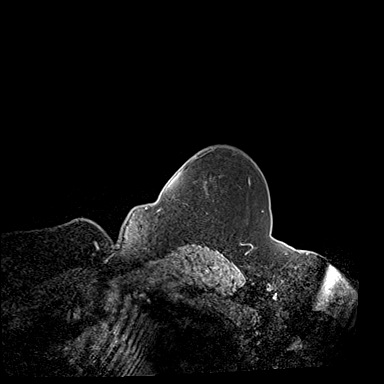
[im 160/160]
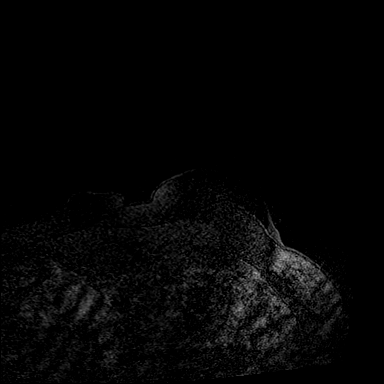

[Series 5: dynamic post 20 · axial · 1.3mm · 0.73mm/px · z∈[-109,+98]mm · 5 of 160 slices shown (2 of 2)]
[im 1/160]
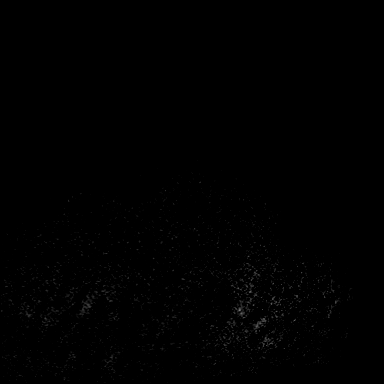
[im 40/160]
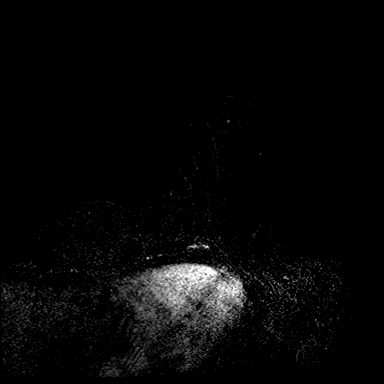
[im 80/160]
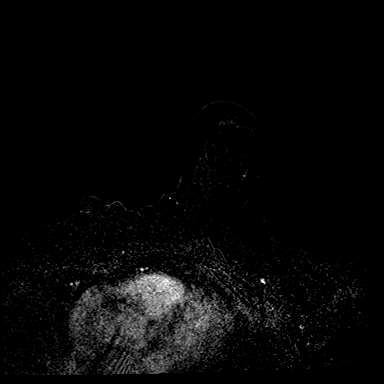
[im 120/160]
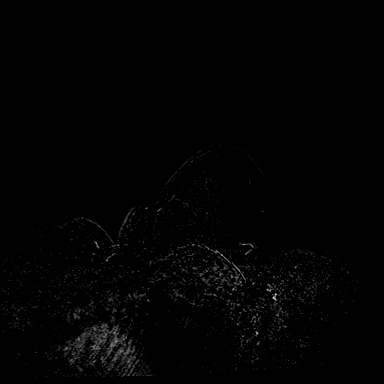
[im 160/160]
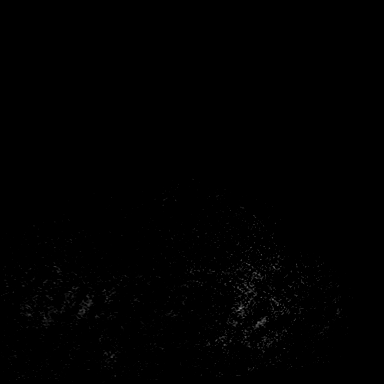

[Series 6: dynamic post 3 · axial · 1.3mm · 0.73mm/px · z∈[-109,+98]mm · 5 of 160 slices shown (1 of 2)]
[im 1/160]
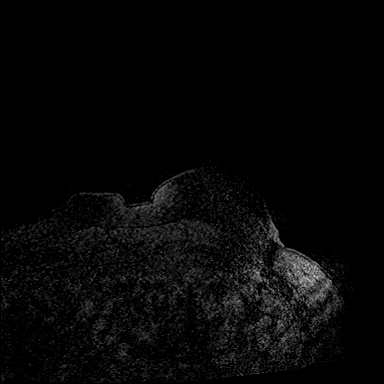
[im 40/160]
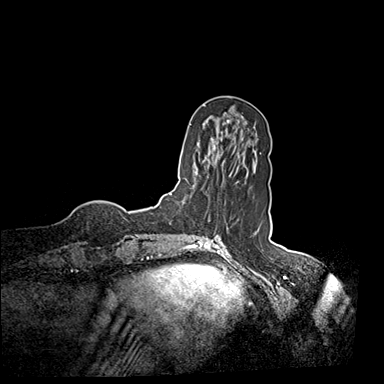
[im 80/160]
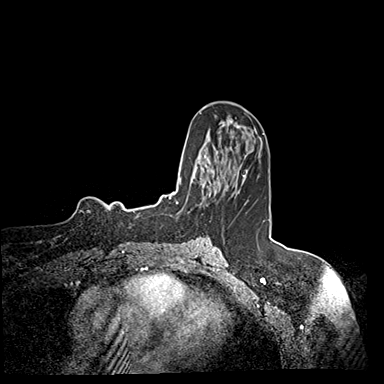
[im 120/160]
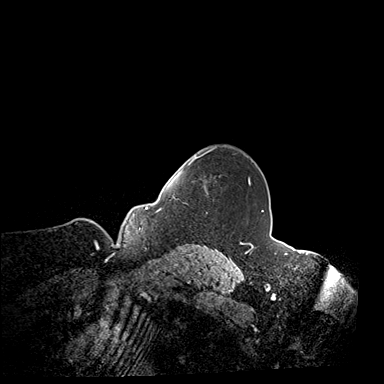
[im 160/160]
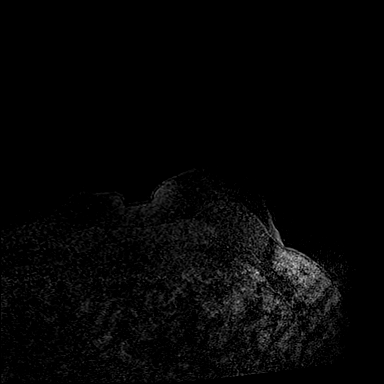

[Series 7: dynamic post 3 · axial · 1.3mm · 0.73mm/px · z∈[-109,+98]mm · 5 of 160 slices shown (2 of 2)]
[im 1/160]
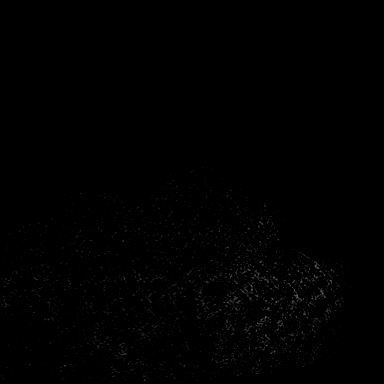
[im 40/160]
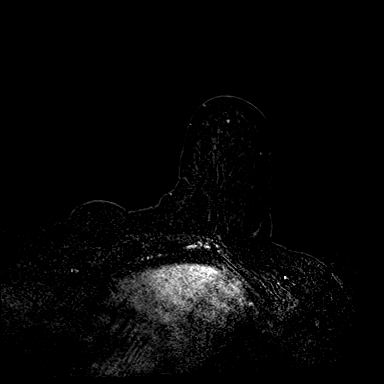
[im 80/160]
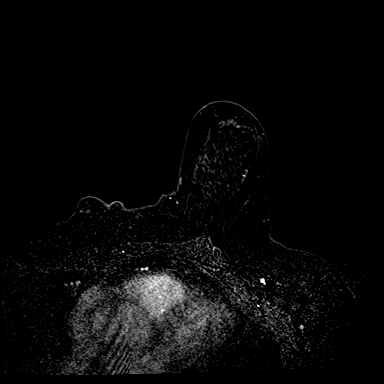
[im 120/160]
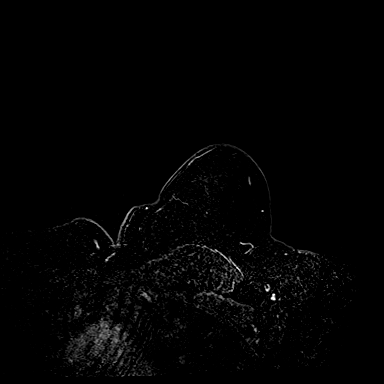
[im 160/160]
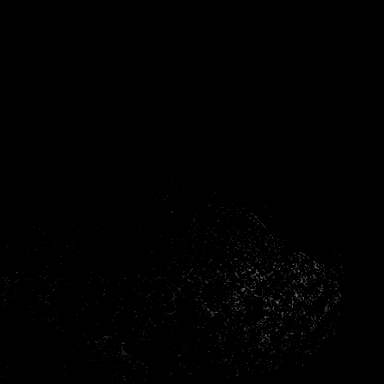

[Series 8: needle confirmation · axial · 1.3mm · 0.73mm/px · z∈[-109,+98]mm · 5 of 160 slices shown]
[im 1/160]
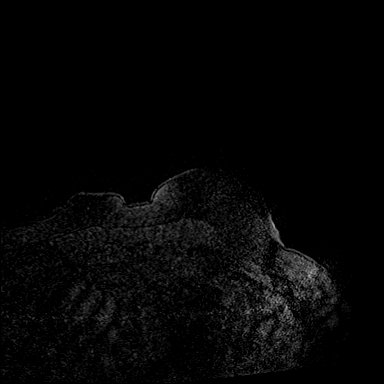
[im 40/160]
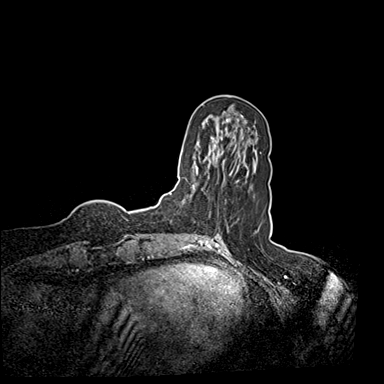
[im 80/160]
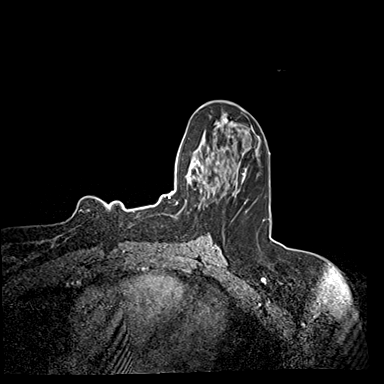
[im 120/160]
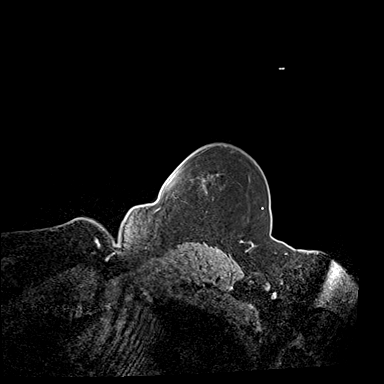
[im 160/160]
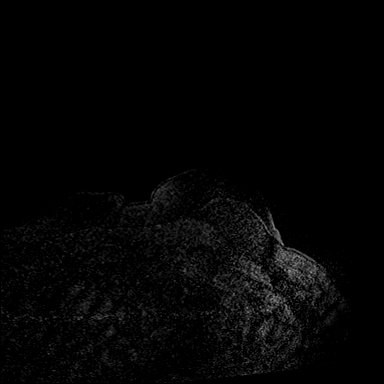

[Series 9: needle confirmation_sub · axial · 1.3mm · 0.73mm/px · z∈[-109,-58]mm · 2 of 160 slices shown]
[im 1/160]
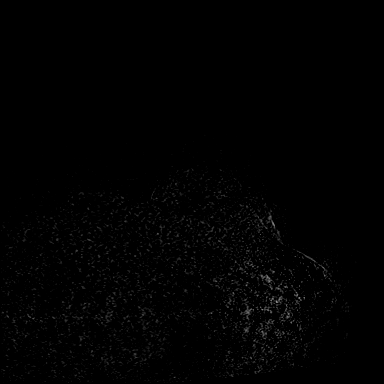
[im 40/160]
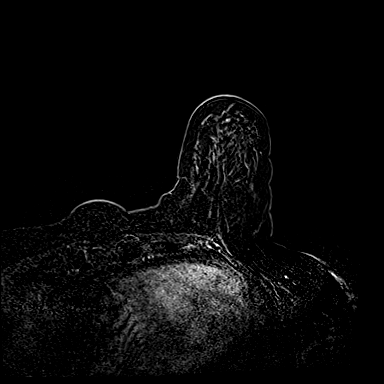

[35 of 48 positions shown; findings below may reference images not displayed]

FINDINGS: I met with the patient, and we discussed the procedure of MRI guided
biopsy, including risks, benefits, and alternatives. Specifically,
we discussed the risks of infection, bleeding, tissue injury, clip
migration, and inadequate sampling. Informed, written consent was
given. The usual time out protocol was performed immediately prior
to the procedure.

Lesion quadrant: UPPER INNER QUADRANT.

Using sterile technique with chlorhexidine as skin antisepsis, 1%
lidocaine and 1% lidocaine with epinephrine as local anesthetic,
using MRI guidance a 9 gauge Eviva vacuum assisted device was used
perform biopsy of the non-mass enhancement in the UPPER INNER
QUADRANT at middle depth using a lateral approach.

At the conclusion of the procedure, a cylinder shaped tissue marker
clip was deployed into the biopsy cavity. Follow-up 2-view mammogram
was performed and dictated separately.
IMPRESSION: MRI guided biopsy of increasing non-mass enhancement in the UPPER
INNER QUADRANT of the LEFT breast at middle depth. No apparent
complications.

## 2021-12-27 IMAGING — MG MM BREAST LOCALIZATION CLIP
4 series · 4 of 12 positions shown · non-contrast
Comparison: Previous exam(s).

CLINICAL DATA: Confirmation of clip placement after MRI guided core
needle biopsy of increasing non-mass enhancement in the UPPER INNER
QUADRANT of the LEFT breast.

EXAM:
2D and 3D DIAGNOSTIC LEFT MAMMOGRAM POST MRI BIOPSY

[L CC synth-2D]
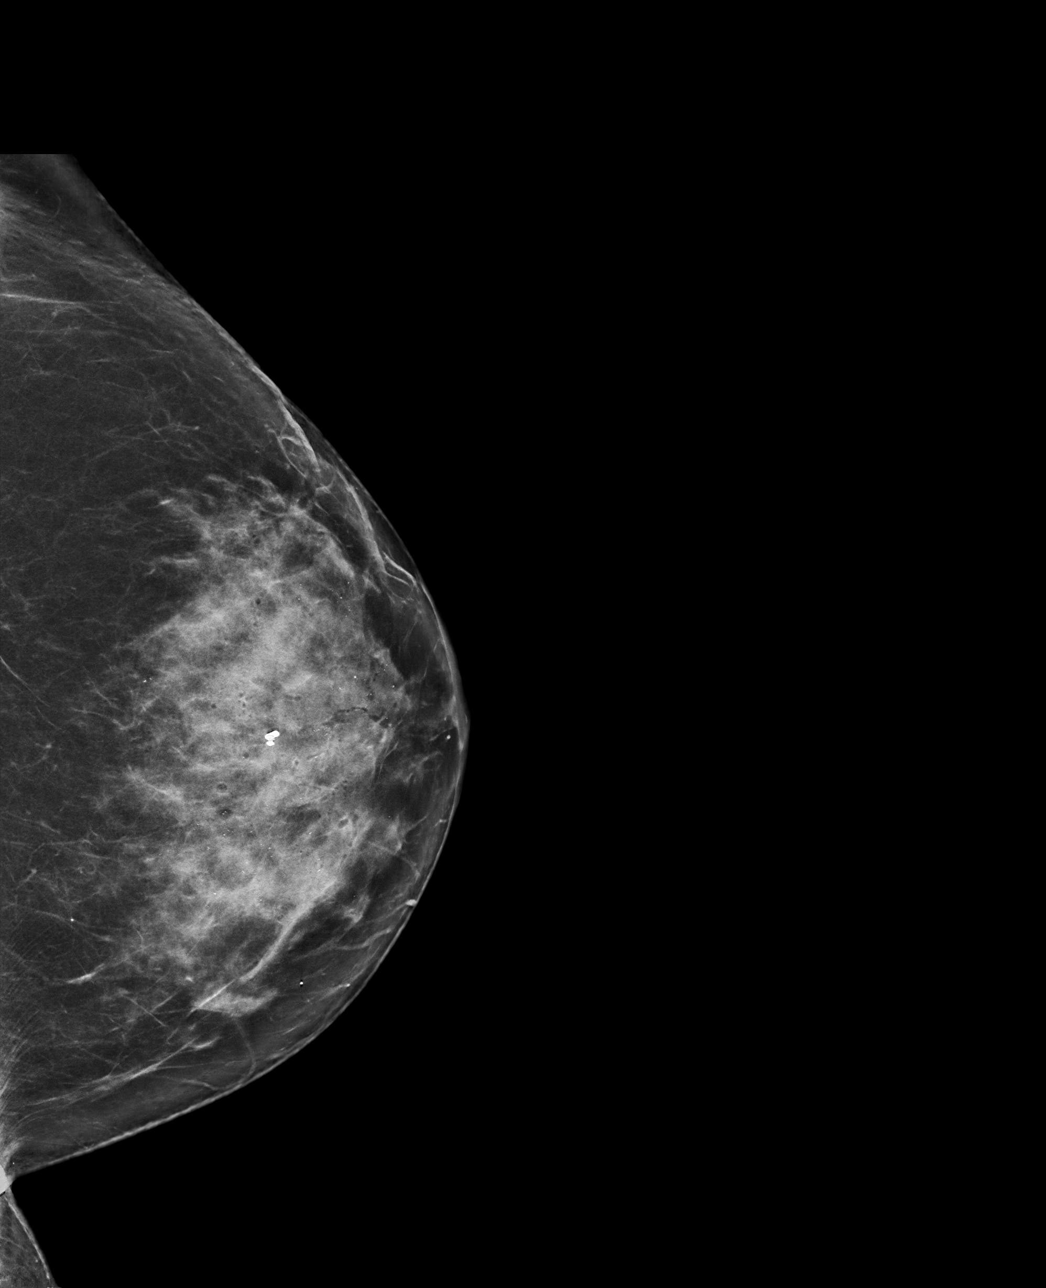

[L ML synth-2D]
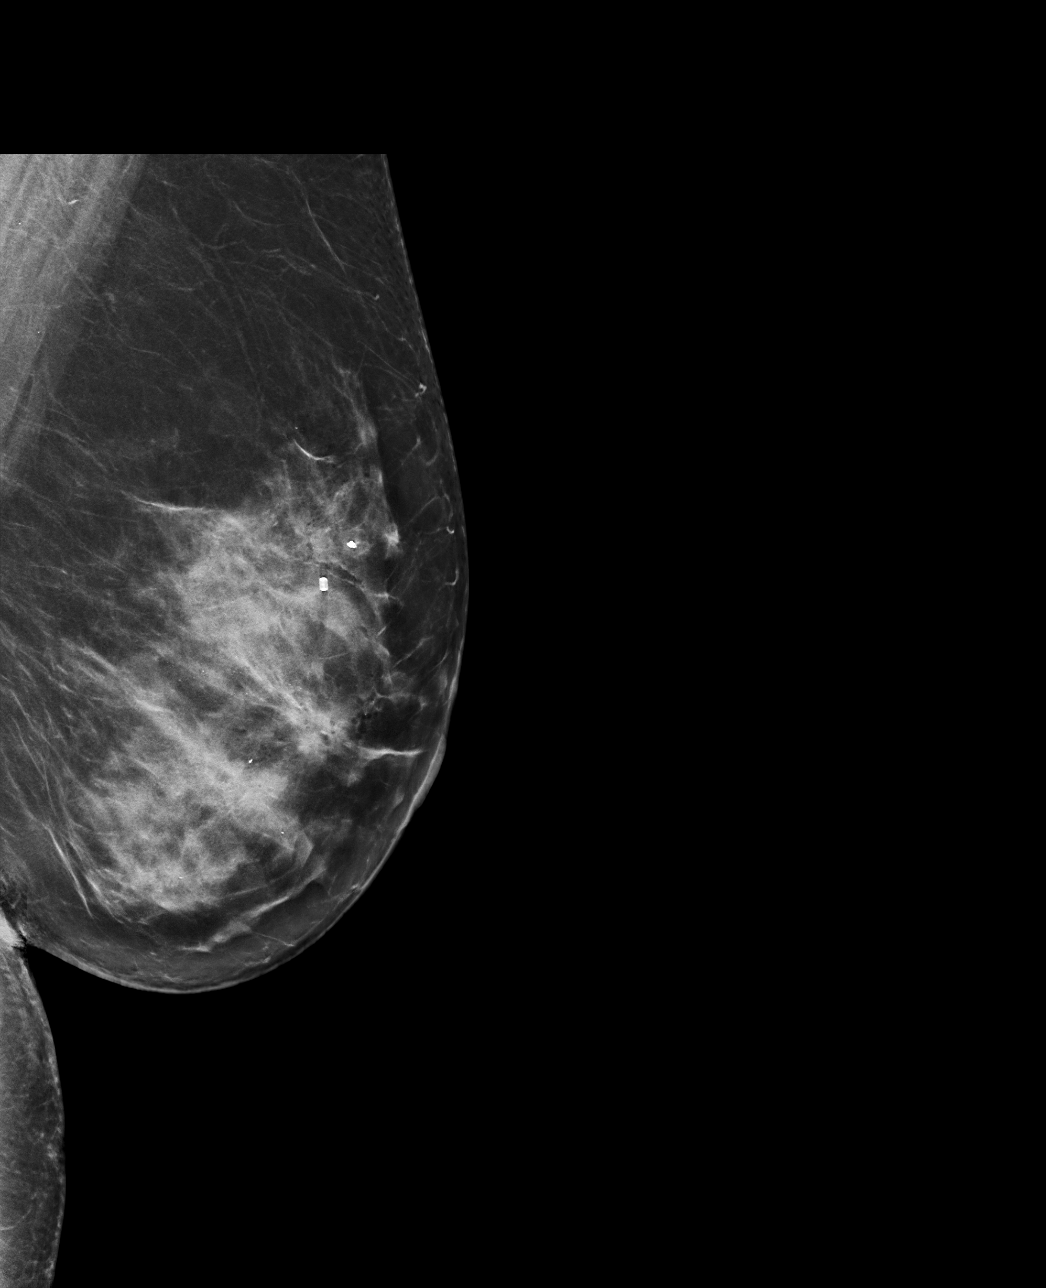

[L ML tomo · tomo slice 42/83.0]
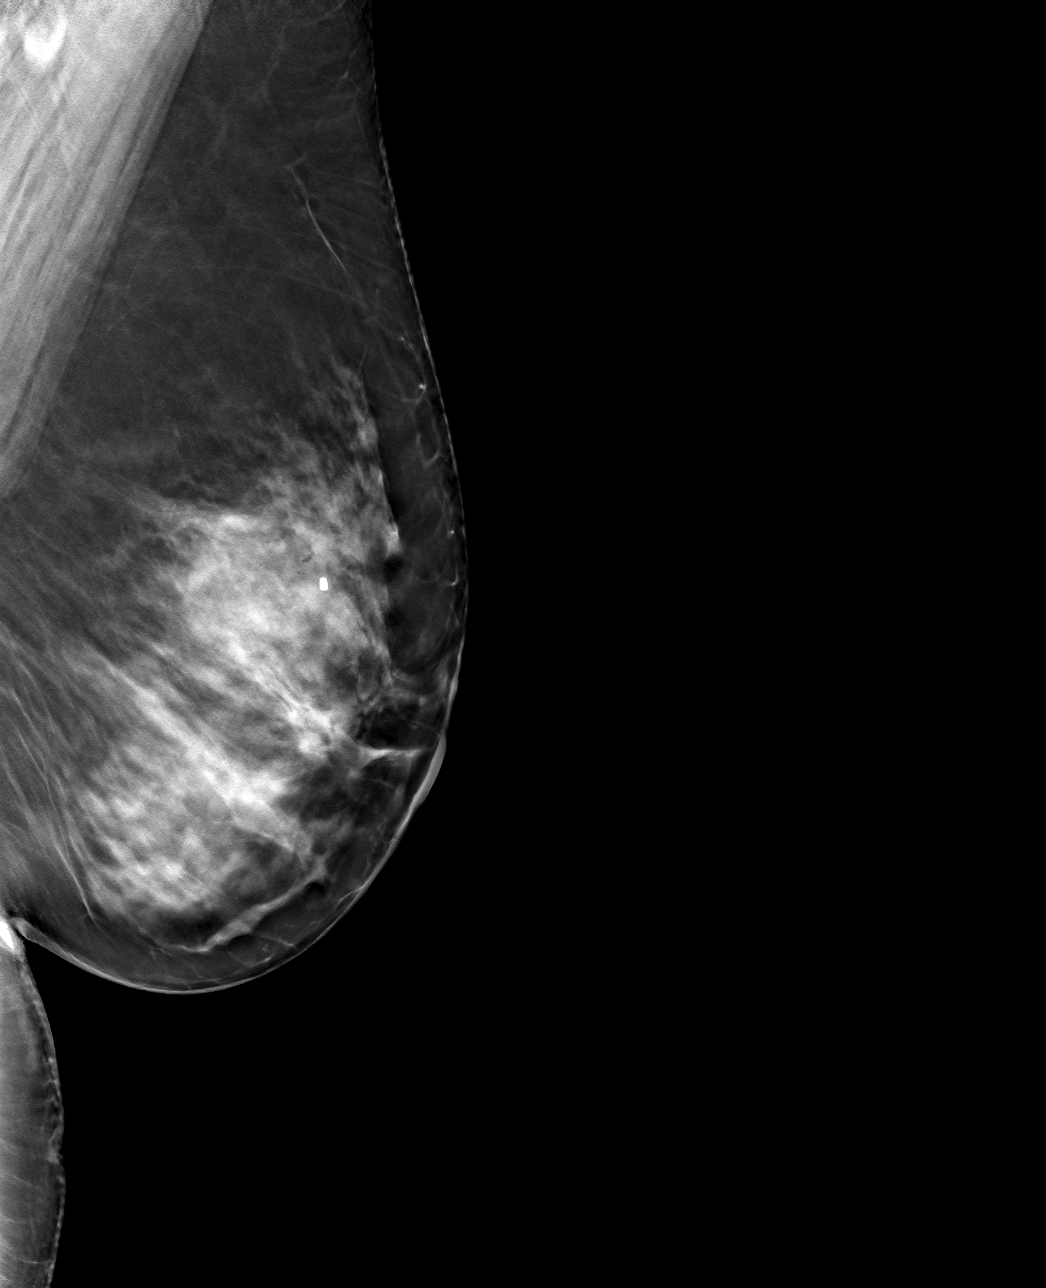

[L CC tomo · tomo slice 43/84.0]
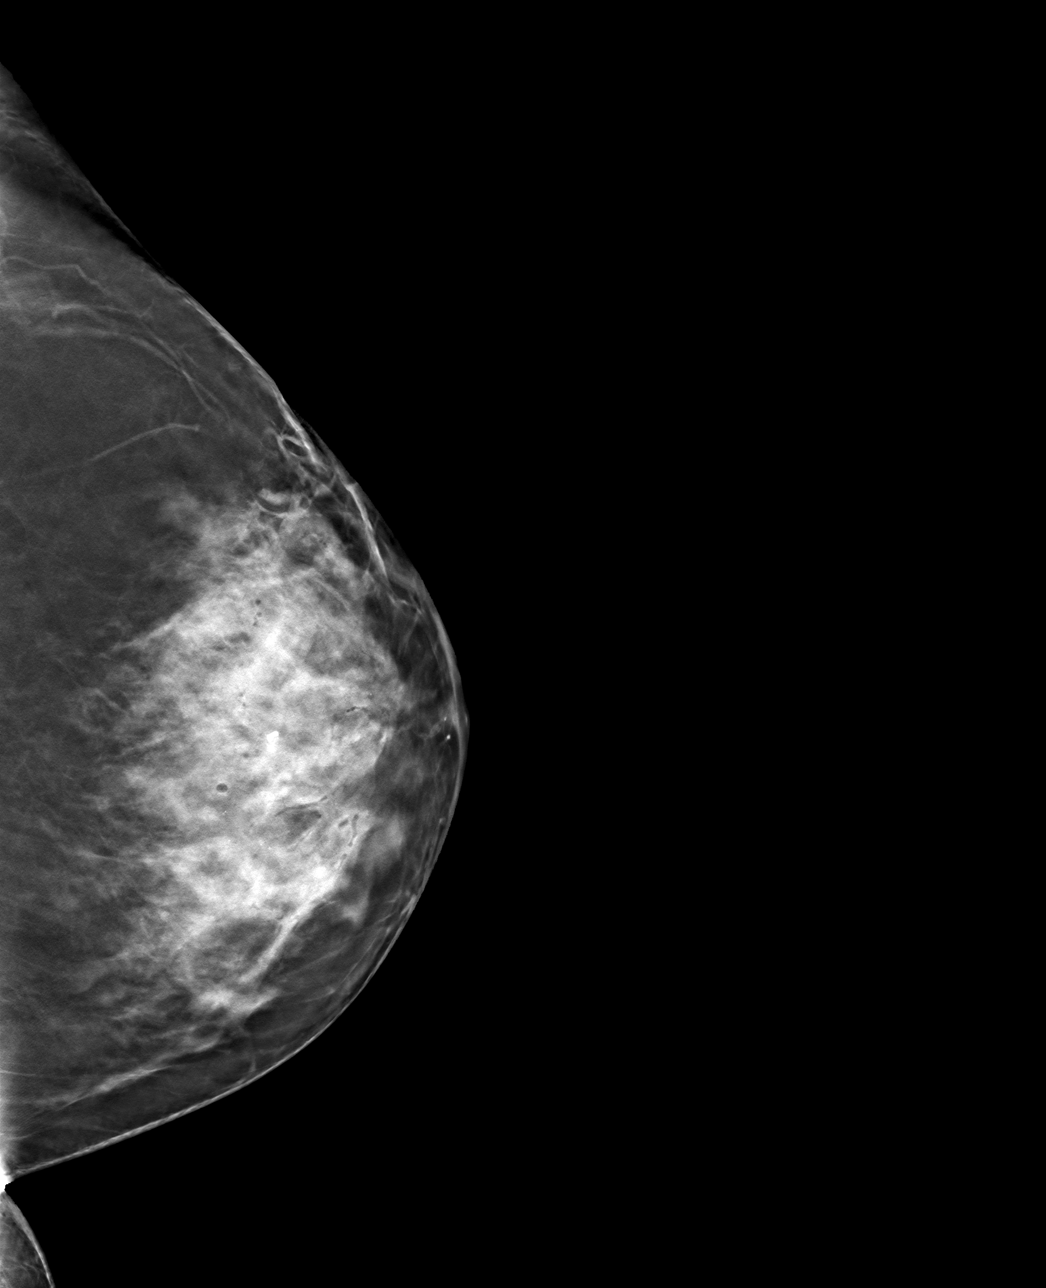

[4 of 12 positions shown; findings below may reference images not displayed]

FINDINGS: 2D and 3D full field CC and mediolateral images were obtained
following MRI guided biopsy of increasing non-mass enhancement in
the UPPER INNER QUADRANT of the LEFT breast. The cylinder shaped
tissue marking clip is appropriately positioned at the site of the
biopsied non-mass enhancement, located inferior to the previously
placed hourglass shaped clip.

Expected post biopsy changes are present without evidence of
hematoma. The current cylinder clip and the previously placed
hourglass clip (associated with lobular neoplasia) are 8 mm apart.
IMPRESSION: Appropriate positioning of the cylinder shaped biopsy marking clip
at the site of the biopsied non-mass enhancement in the UPPER INNER
QUADRANT of the LEFT breast.

Final Assessment: Post Procedure Mammograms for Marker Placement

## 2021-12-27 MED ORDER — GADOBUTROL 1 MMOL/ML IV SOLN
8.0000 mL | Freq: Once | INTRAVENOUS | Status: AC | PRN
  Administered 2021-12-27: 8 mL via INTRAVENOUS

## 2022-01-23 ENCOUNTER — Other Ambulatory Visit: Payer: Self-pay | Admitting: General Surgery

## 2022-01-23 DIAGNOSIS — N6092 Unspecified benign mammary dysplasia of left breast: Secondary | ICD-10-CM

## 2022-01-27 ENCOUNTER — Other Ambulatory Visit: Payer: Self-pay | Admitting: General Surgery

## 2022-01-27 DIAGNOSIS — N6092 Unspecified benign mammary dysplasia of left breast: Secondary | ICD-10-CM

## 2022-01-30 NOTE — Progress Notes (Signed)
Surgical Instructions    Your procedure is scheduled on Thursday, 02/06/22 at 3:45 PM.   Report to [facility] Main Entrance "A" at 1:45 PM, then check in with the Admitting office.  Call this number if you have problems the morning of surgery:  [phone]   If you have any questions prior to your surgery date call [phone]: Open Monday-Friday 8am-4pm    Remember:  Do not eat food after midnight the night before your surgery, Wednesday, 02/05/22.  You may drink clear liquids until 12:45 PM the day of your surgery.   Clear liquids allowed are: Water, Non-Citrus Juices (without pulp), Carbonated Beverages, Clear Tea, Black Coffee ONLY (NO MILK, CREAM OR POWDERED CREAMER of any kind), and Gatorade  Drink the Pre-Surgery Ensure drink just prior to 12:45 PM. It will be the last liquid you will drink that day.     Take these medicines the morning of surgery with A SIP OF WATER: Levothyroxine (Synthroid), Tamoxifen (Nolvadex)  As of today, STOP taking any Aspirin (unless otherwise instructed by your surgeon) Aleve, Naproxen, Ibuprofen, Motrin, Advil, Goody's, BC's, all herbal medications, fish oil, and all vitamins.     Do not wear jewelry or makeup Do not wear lotions, powders, perfume or deodorant. Do not shave 48 hours prior to surgery.   Do not bring valuables to the hospital. Do not wear nail polish, gel polish, artificial nails, or any other type of covering on natural nails (fingers and toes) If you have artificial nails or gel coating that need to be removed by a nail salon, please have this removed prior to surgery. Artificial nails or gel coating may interfere with anesthesia's ability to adequately monitor your vital signs.  [facility] is not responsible for any belongings or valuables. .   Do NOT Smoke (Tobacco/Vaping)  24 hours prior to your procedure  If you use a CPAP at night, you may bring your mask for your overnight stay.   Contacts, glasses, hearing aids, dentures  or partials may not be worn into surgery, please bring cases for these belongings   For patients admitted to the hospital, discharge time will be determined by your treatment team.   Patients discharged the day of surgery will not be allowed to drive home, and someone needs to stay with them for 24 hours.   SURGICAL WAITING ROOM VISITATION Patients having surgery or a procedure may have no more than 2 support people in the waiting area - these visitors may rotate.   Children under the age of 45 must have an adult with them who is not the patient. If the patient needs to stay at the hospital during part of their recovery, the visitor guidelines for inpatient rooms apply. Pre-op nurse will coordinate an appropriate time for 1 support person to accompany patient in pre-op.  This support person may not rotate.   Please refer to the [facility] website for the visitor guidelines for Inpatients (after your surgery is over and you are in a regular room).    Special instructions:    Oral Hygiene is also important to reduce your risk of infection.  Remember - BRUSH YOUR TEETH THE MORNING OF SURGERY WITH YOUR REGULAR TOOTHPASTE   [facility]- Preparing For Surgery  Before surgery, you can play an important role. Because skin is not sterile, your skin needs to be as free of germs as possible. You can reduce the number of germs on your skin by washing with CHG (chlorahexidine gluconate) Soap before  surgery.  CHG is an antiseptic cleaner which kills germs and bonds with the skin to continue killing germs even after washing.     Please do not use if you have an allergy to CHG or antibacterial soaps. If your skin becomes reddened/irritated stop using the CHG.  Do not shave (including legs and underarms) for at least 48 hours prior to first CHG shower. It is OK to shave your face.  Please follow these instructions carefully.     Shower the NIGHT BEFORE SURGERY and the MORNING OF SURGERY with CHG Soap.    If you chose to wash your hair, wash your hair first as usual with your normal shampoo. After you shampoo, rinse your hair and body thoroughly to remove the shampoo.  Then [employer] and genitals (private parts) with your normal soap and rinse thoroughly to remove soap.  After that Use CHG Soap as you would any other liquid soap. You can apply CHG directly to the skin and wash gently with a scrungie or a clean washcloth.   Apply the CHG Soap to your body ONLY FROM THE NECK DOWN.  Do not use on open wounds or open sores. Avoid contact with your eyes, ears, mouth and genitals (private parts). Wash Face and genitals (private parts)  with your normal soap.   Wash thoroughly, paying special attention to the area where your surgery will be performed.  Thoroughly rinse your body with warm water from the neck down.  DO NOT shower/wash with your normal soap after using and rinsing off the CHG Soap.  Pat yourself dry with a CLEAN TOWEL.  Wear CLEAN PAJAMAS to bed the night before surgery  Place CLEAN SHEETS on your bed the night before your surgery  DO NOT SLEEP WITH PETS.   Day of Surgery:  Take a shower with CHG soap. Wear Clean/Comfortable clothing the morning of surgery Do not apply any deodorants/lotions.   Remember to brush your teeth WITH YOUR REGULAR TOOTHPASTE.    If you received a COVID test during your pre-op visit, it is requested that you wear a mask when out in public, stay away from anyone that may not be feeling well, and notify your surgeon if you develop symptoms. If you have been in contact with anyone that has tested positive in the last 10 days, please notify your surgeon.    Please read over the following fact sheets that you were given.

## 2022-01-31 ENCOUNTER — Encounter (HOSPITAL_COMMUNITY): Payer: Self-pay | Admitting: *Deleted

## 2022-01-31 ENCOUNTER — Other Ambulatory Visit: Payer: Self-pay

## 2022-01-31 ENCOUNTER — Encounter (HOSPITAL_COMMUNITY)
Admission: RE | Admit: 2022-01-31 | Discharge: 2022-01-31 | Disposition: A | Discharge status: Home / Self Care | Payer: 59 | Source: Ambulatory Visit | Attending: General Surgery | Admitting: General Surgery

## 2022-01-31 VITALS — BP 134/65 | HR 71 | Temp 98.2°F | Resp 17 | Ht 64.0 in | Wt 171.9 lb

## 2022-01-31 DIAGNOSIS — R002 Palpitations: Secondary | ICD-10-CM | POA: Diagnosis not present

## 2022-01-31 DIAGNOSIS — Z01818 Encounter for other preprocedural examination: Secondary | ICD-10-CM | POA: Diagnosis present

## 2022-01-31 LAB — BASIC METABOLIC PANEL
Anion gap: 9 (ref 5–15)
BUN: 14 mg/dL (ref 6–20)
CO2: 27 mmol/L (ref 22–32)
Calcium: 9.2 mg/dL (ref 8.9–10.3)
Chloride: 106 mmol/L (ref 98–111)
Creatinine, Ser: 0.89 mg/dL (ref 0.44–1.00)
GFR, Estimated: >60 mL/min (ref >60)
Glucose, Bld: 102 mg/dL — ABNORMAL HIGH (ref 70–99)
Potassium: 4.2 mmol/L (ref 3.5–5.1)
Sodium: 142 mmol/L (ref 135–145)

## 2022-01-31 LAB — CBC
HCT: 39.4 % (ref 36.0–46.0)
Hemoglobin: 13.5 g/dL (ref 12.0–15.0)
MCH: 31.5 pg (ref 26.0–34.0)
MCHC: 34.3 g/dL (ref 30.0–36.0)
MCV: 92.1 fL (ref 80.0–100.0)
Platelets: 222 10*3/uL (ref 150–400)
RBC: 4.28 MIL/uL (ref 3.87–5.11)
RDW: 11.9 % (ref 11.5–15.5)
WBC: 5.2 10*3/uL (ref 4.0–10.5)
nRBC: 0 % (ref 0.0–0.2)

## 2022-01-31 NOTE — Progress Notes (Signed)
PCP - Dr. Grier Mitts Cardiologist - denies  PPM/ICD - denies   Chest x-ray - 07/23/1999 EKG - 05/15/20 Stress Test - denies ECHO - denies Cardiac Cath - denies  Sleep Study - denies   DM- denies  ASA/Blood Thinner Instructions: n/a   ERAS Protcol - yes, no drink   COVID TEST- n/a   Anesthesia review: no  Patient denies shortness of breath, fever, cough and chest pain at PAT appointment   All instructions explained to the patient, with a verbal understanding of the material. Patient agrees to go over the instructions while at home for a better understanding. The opportunity to ask questions was provided.

## 2022-02-05 ENCOUNTER — Ambulatory Visit
Admission: RE | Admit: 2022-02-05 | Discharge: 2022-02-05 | Disposition: A | Discharge status: Home / Self Care | Payer: 59 | Source: Ambulatory Visit | Attending: General Surgery | Admitting: General Surgery

## 2022-02-05 DIAGNOSIS — N6092 Unspecified benign mammary dysplasia of left breast: Secondary | ICD-10-CM

## 2022-02-06 ENCOUNTER — Encounter (HOSPITAL_COMMUNITY)
Admission: RE | Disposition: A | Discharge status: Home / Self Care | Payer: Self-pay | Source: Home / Self Care | Attending: General Surgery

## 2022-02-06 ENCOUNTER — Ambulatory Visit
Admission: RE | Admit: 2022-02-06 | Discharge: 2022-02-06 | Disposition: A | Discharge status: Home / Self Care | Payer: 59 | Source: Ambulatory Visit | Attending: General Surgery | Admitting: General Surgery

## 2022-02-06 ENCOUNTER — Ambulatory Visit (HOSPITAL_BASED_OUTPATIENT_CLINIC_OR_DEPARTMENT_OTHER): Payer: 59 | Admitting: Anesthesiology

## 2022-02-06 ENCOUNTER — Ambulatory Visit (HOSPITAL_COMMUNITY): Payer: 59 | Admitting: Anesthesiology

## 2022-02-06 ENCOUNTER — Encounter (HOSPITAL_COMMUNITY): Payer: Self-pay | Admitting: General Surgery

## 2022-02-06 ENCOUNTER — Ambulatory Visit (HOSPITAL_COMMUNITY)
Admission: RE | Admit: 2022-02-06 | Discharge: 2022-02-06 | Disposition: A | Discharge status: Home / Self Care | Payer: 59 | Attending: General Surgery | Admitting: General Surgery

## 2022-02-06 DIAGNOSIS — Z79899 Other long term (current) drug therapy: Secondary | ICD-10-CM | POA: Diagnosis not present

## 2022-02-06 DIAGNOSIS — F32A Depression, unspecified: Secondary | ICD-10-CM | POA: Insufficient documentation

## 2022-02-06 DIAGNOSIS — F419 Anxiety disorder, unspecified: Secondary | ICD-10-CM | POA: Diagnosis not present

## 2022-02-06 DIAGNOSIS — Z79890 Hormone replacement therapy (postmenopausal): Secondary | ICD-10-CM | POA: Diagnosis not present

## 2022-02-06 DIAGNOSIS — Z87891 Personal history of nicotine dependence: Secondary | ICD-10-CM | POA: Insufficient documentation

## 2022-02-06 DIAGNOSIS — N6092 Unspecified benign mammary dysplasia of left breast: Secondary | ICD-10-CM

## 2022-02-06 DIAGNOSIS — E039 Hypothyroidism, unspecified: Secondary | ICD-10-CM | POA: Diagnosis not present

## 2022-02-06 DIAGNOSIS — N6022 Fibroadenosis of left breast: Secondary | ICD-10-CM | POA: Insufficient documentation

## 2022-02-06 HISTORY — PX: RADIOACTIVE SEED GUIDED EXCISIONAL BREAST BIOPSY: SHX6490

## 2022-02-06 SURGERY — RADIOACTIVE SEED GUIDED BREAST BIOPSY
Anesthesia: General | Site: Breast | Laterality: Left

## 2022-02-06 MED ORDER — FENTANYL CITRATE (PF) 100 MCG/2ML IJ SOLN
INTRAMUSCULAR | Status: DC | PRN
  Administered 2022-02-06 (×2): 50 ug via INTRAVENOUS

## 2022-02-06 MED ORDER — CHLORHEXIDINE GLUCONATE 0.12 % MT SOLN
15.0000 mL | Freq: Once | OROMUCOSAL | Status: AC
  Administered 2022-02-06: 15 mL via OROMUCOSAL
  Filled 2022-02-06: qty 15

## 2022-02-06 MED ORDER — FENTANYL CITRATE (PF) 250 MCG/5ML IJ SOLN
INTRAMUSCULAR | Status: AC
  Filled 2022-02-06: qty 5

## 2022-02-06 MED ORDER — ENSURE PRE-SURGERY PO LIQD: 296.0000 mL | Freq: Once | ORAL | Status: DC

## 2022-02-06 MED ORDER — EPHEDRINE SULFATE (PRESSORS) 50 MG/ML IJ SOLN
INTRAMUSCULAR | Status: DC | PRN
  Administered 2022-02-06 (×2): 5 mg via INTRAVENOUS

## 2022-02-06 MED ORDER — LIDOCAINE 2% (20 MG/ML) 5 ML SYRINGE
INTRAMUSCULAR | Status: AC
Start: 2022-02-06 — End: ?
  Filled 2022-02-06: qty 5

## 2022-02-06 MED ORDER — PROPOFOL 10 MG/ML IV BOLUS
INTRAVENOUS | Status: DC | PRN
  Administered 2022-02-06: 180 mg via INTRAVENOUS

## 2022-02-06 MED ORDER — MIDAZOLAM HCL 2 MG/2ML IJ SOLN
INTRAMUSCULAR | Status: DC | PRN
  Administered 2022-02-06: 2 mg via INTRAVENOUS

## 2022-02-06 MED ORDER — DEXAMETHASONE SODIUM PHOSPHATE 10 MG/ML IJ SOLN
INTRAMUSCULAR | Status: AC
  Filled 2022-02-06: qty 2

## 2022-02-06 MED ORDER — EPHEDRINE 5 MG/ML INJ
INTRAVENOUS | Status: AC
  Filled 2022-02-06: qty 10

## 2022-02-06 MED ORDER — PHENYLEPHRINE 80 MCG/ML (10ML) SYRINGE FOR IV PUSH (FOR BLOOD PRESSURE SUPPORT)
PREFILLED_SYRINGE | INTRAVENOUS | Status: AC
  Filled 2022-02-06: qty 30

## 2022-02-06 MED ORDER — ONDANSETRON HCL 4 MG/2ML IJ SOLN
INTRAMUSCULAR | Status: AC
  Filled 2022-02-06: qty 2

## 2022-02-06 MED ORDER — ONDANSETRON HCL 4 MG/2ML IJ SOLN
INTRAMUSCULAR | Status: DC | PRN
  Administered 2022-02-06: 4 mg via INTRAVENOUS

## 2022-02-06 MED ORDER — CEFAZOLIN SODIUM-DEXTROSE 2-4 GM/100ML-% IV SOLN
2.0000 g | INTRAVENOUS | Status: AC
  Administered 2022-02-06: 2 g via INTRAVENOUS
  Filled 2022-02-06: qty 100

## 2022-02-06 MED ORDER — BUPIVACAINE-EPINEPHRINE (PF) 0.25% -1:200000 IJ SOLN
INTRAMUSCULAR | Status: DC | PRN
  Administered 2022-02-06: 10 mL

## 2022-02-06 MED ORDER — DEXAMETHASONE SODIUM PHOSPHATE 10 MG/ML IJ SOLN
INTRAMUSCULAR | Status: DC | PRN
  Administered 2022-02-06: 10 mg via INTRAVENOUS

## 2022-02-06 MED ORDER — ACETAMINOPHEN 500 MG PO TABS
1000.0000 mg | ORAL_TABLET | ORAL | Status: AC
  Administered 2022-02-06: 1000 mg via ORAL
  Filled 2022-02-06: qty 2

## 2022-02-06 MED ORDER — CELECOXIB 200 MG PO CAPS
400.0000 mg | ORAL_CAPSULE | ORAL | Status: AC
  Administered 2022-02-06: 400 mg via ORAL
  Filled 2022-02-06: qty 2

## 2022-02-06 MED ORDER — ORAL CARE MOUTH RINSE: 15.0000 mL | Freq: Once | OROMUCOSAL | Status: AC

## 2022-02-06 MED ORDER — PROPOFOL 10 MG/ML IV BOLUS
INTRAVENOUS | Status: AC
  Filled 2022-02-06: qty 20

## 2022-02-06 MED ORDER — MIDAZOLAM HCL 2 MG/2ML IJ SOLN
INTRAMUSCULAR | Status: AC
  Filled 2022-02-06: qty 2

## 2022-02-06 MED ORDER — LIDOCAINE 2% (20 MG/ML) 5 ML SYRINGE
INTRAMUSCULAR | Status: DC | PRN
  Administered 2022-02-06: 100 mg via INTRAVENOUS

## 2022-02-06 MED ORDER — HYDROMORPHONE HCL 1 MG/ML IJ SOLN: 0.2500 mg | INTRAMUSCULAR | Status: DC | PRN

## 2022-02-06 MED ORDER — LACTATED RINGERS IV SOLN: INTRAVENOUS | Status: DC

## 2022-02-06 MED ORDER — CHLORHEXIDINE GLUCONATE CLOTH 2 % EX PADS: 6.0000 | MEDICATED_PAD | Freq: Once | CUTANEOUS | Status: DC

## 2022-02-06 MED ORDER — 0.9 % SODIUM CHLORIDE (POUR BTL) OPTIME
TOPICAL | Status: DC | PRN
  Administered 2022-02-06: 1000 mL

## 2022-02-06 MED ORDER — BUPIVACAINE-EPINEPHRINE (PF) 0.25% -1:200000 IJ SOLN
INTRAMUSCULAR | Status: AC
Start: 2022-02-06 — End: ?
  Filled 2022-02-06: qty 30

## 2022-02-06 SURGICAL SUPPLY — 57 items
ADH SKN CLS APL DERMABOND .7 (GAUZE/BANDAGES/DRESSINGS) ×1
APL PRP STRL LF DISP 70% ISPRP (MISCELLANEOUS) ×1
APPLIER CLIP 9.375 MED OPEN (MISCELLANEOUS)
APR CLP MED 9.3 20 MLT OPN (MISCELLANEOUS)
BINDER BREAST LRG (GAUZE/BANDAGES/DRESSINGS) IMPLANT
BINDER BREAST MEDIUM (GAUZE/BANDAGES/DRESSINGS) IMPLANT
BINDER BREAST XLRG (GAUZE/BANDAGES/DRESSINGS) IMPLANT
BINDER BREAST XXLRG (GAUZE/BANDAGES/DRESSINGS) IMPLANT
BLADE SURG 15 STRL LF DISP TIS (BLADE) ×2 IMPLANT
BLADE SURG 15 STRL SS (BLADE)
CANISTER SUC SOCK COL 7IN (MISCELLANEOUS) IMPLANT
CANISTER SUCT 1200ML W/VALVE (MISCELLANEOUS) IMPLANT
CHLORAPREP W/TINT 26 (MISCELLANEOUS) ×3 IMPLANT
CLIP APPLIE 9.375 MED OPEN (MISCELLANEOUS) IMPLANT
CLIP TI WIDE RED SMALL 6 (CLIP) IMPLANT
COVER BACK TABLE 60X90IN (DRAPES) ×2 IMPLANT
COVER MAYO STAND STRL (DRAPES) ×3 IMPLANT
COVER PROBE W GEL 5X96 (DRAPES) ×3 IMPLANT
DERMABOND ADVANCED (GAUZE/BANDAGES/DRESSINGS) ×1
DERMABOND ADVANCED .7 DNX12 (GAUZE/BANDAGES/DRESSINGS) ×2 IMPLANT
DRAPE LAPAROSCOPIC ABDOMINAL (DRAPES) ×2 IMPLANT
DRAPE UTILITY XL STRL (DRAPES) ×2 IMPLANT
DRSG TEGADERM 4X4.75 (GAUZE/BANDAGES/DRESSINGS) IMPLANT
ELECT COATED BLADE 2.86 ST (ELECTRODE) ×3 IMPLANT
ELECT REM PT RETURN 9FT ADLT (ELECTROSURGICAL) ×2
ELECTRODE REM PT RTRN 9FT ADLT (ELECTROSURGICAL) ×2 IMPLANT
GAUZE SPONGE 4X4 12PLY STRL LF (GAUZE/BANDAGES/DRESSINGS) IMPLANT
GLOVE BIO SURGEON STRL SZ7 (GLOVE) ×5 IMPLANT
GLOVE BIOGEL PI IND STRL 7.5 (GLOVE) ×2 IMPLANT
GLOVE BIOGEL PI INDICATOR 7.5 (GLOVE) ×1
GOWN STRL REUS W/ TWL LRG LVL3 (GOWN DISPOSABLE) ×4 IMPLANT
GOWN STRL REUS W/TWL LRG LVL3 (GOWN DISPOSABLE) ×4
HEMOSTAT ARISTA ABSORB 3G PWDR (HEMOSTASIS) IMPLANT
KIT MARKER MARGIN INK (KITS) ×3 IMPLANT
NDL HYPO 25X1 1.5 SAFETY (NEEDLE) ×2 IMPLANT
NEEDLE HYPO 25X1 1.5 SAFETY (NEEDLE) ×2 IMPLANT
NS IRRIG 1000ML POUR BTL (IV SOLUTION) ×1 IMPLANT
PACK BASIN DAY SURGERY FS (CUSTOM PROCEDURE TRAY) ×2 IMPLANT
PACK GENERAL/GYN (CUSTOM PROCEDURE TRAY) ×1 IMPLANT
PENCIL SMOKE EVACUATOR (MISCELLANEOUS) ×2 IMPLANT
RETRACTOR ONETRAX LX 90X20 (MISCELLANEOUS) IMPLANT
SLEEVE SCD COMPRESS KNEE MED (STOCKING) ×3 IMPLANT
SPIKE FLUID TRANSFER (MISCELLANEOUS) IMPLANT
SPONGE T-LAP 4X18 ~~LOC~~+RFID (SPONGE) ×3 IMPLANT
STRIP CLOSURE SKIN 1/2X4 (GAUZE/BANDAGES/DRESSINGS) ×3 IMPLANT
SUT MNCRL AB 4-0 PS2 18 (SUTURE) IMPLANT
SUT MON AB 5-0 PS2 18 (SUTURE) ×1 IMPLANT
SUT SILK 2 0 SH (SUTURE) IMPLANT
SUT VIC AB 2-0 SH 27 (SUTURE) ×2
SUT VIC AB 2-0 SH 27XBRD (SUTURE) ×2 IMPLANT
SUT VIC AB 3-0 SH 27 (SUTURE) ×2
SUT VIC AB 3-0 SH 27X BRD (SUTURE) ×2 IMPLANT
SYR CONTROL 10ML LL (SYRINGE) ×3 IMPLANT
TOWEL GREEN STERILE FF (TOWEL DISPOSABLE) ×3 IMPLANT
TRAY FAXITRON CT DISP (TRAY / TRAY PROCEDURE) ×2 IMPLANT
TUBE CONNECTING 20X1/4 (TUBING) IMPLANT
YANKAUER SUCT BULB TIP NO VENT (SUCTIONS) IMPLANT

## 2022-02-06 NOTE — H&P (Signed)
56 y.o. female who is seen today for alh. She is well-known to me from earlier in 2022 where she underwent a lumpectomy for DCIS that was estrogen receptor and progesterone receptor positive. This was intermediate. She did not proceed with radiation or antiestrogen at that time as she had a family emergency with her father and she took care of him during that time. She has no symptoms related to either breast at this point. Due to her risk she had undergone an MRI recently which shows C density breast. The right side is normal. The lymph nodes are all normal. There are in the left breast in the upper left breast there is some suspicious linear non-mass enhancement measuring 1.6 x 0.6 x 1.1 cm. This has undergone a biopsy which shows this to be lobular neoplasia involving a small sclerotic lesion. she had repeat mri that shows this area is larger now at 2.1x1.3 cm. It underwent a rebiopsy and is still alh. She is referred back. She is taking 5 mg tamoxifen now since January.  Review of Systems: A complete review of systems was obtained from the patient. I have reviewed this information and discussed as appropriate with the patient. See HPI as well for other ROS.  Review of Systems  All other systems reviewed and are negative.   Medical History: Past Medical History:  Diagnosis Date  Thyroid disease   There is no problem list on file for this patient.  Past Surgical History:  Procedure Laterality Date  MASTECTOMY PARTIAL / LUMPECTOMY 12/18/2020   No Known Allergies  Current Outpatient Medications on File Prior to Visit  Medication Sig Dispense Refill  acyclovir (ZOVIRAX) 5 % ointment acyclovir 5 % topical ointment APPLY TO THE AFFECTED AREA(S) BY TOPICAL ROUTE EVERY 3 HOURS 6 TIMES PER DAY x 7 days  b complex vitamins capsule Take 1 capsule by mouth once daily  benzonatate (TESSALON) 100 MG capsule benzonatate 100 mg capsule  biotin 10 mg Tab Take by mouth  cholecalciferol (VITAMIN D3)  1000 unit tablet Take by mouth  fluticasone propionate (FLONASE) 50 mcg/actuation nasal spray fluticasone propionate 50 mcg/actuation nasal spray,suspension SHAKE LIQUID AND USE 1 SPRAY IN EACH NOSTRIL DAILY  L. acidophilus/Bifid. animalis 32 billion cell Cap Take by mouth once daily  levothyroxine (SYNTHROID) 25 MCG tablet levothyroxine 25 mcg tablet  lidocaine (XYLOCAINE) 2 % jelly lidocaine HCl 2 % mucosal jelly  liothyronine (CYTOMEL) 5 MCG tablet TAKE 1 TAB EVERY MORNING X 7 DAYS, IF WELL TOLERATED THEN INCREASE TO 2 TABS EVERY MORNING  loratadine 10 mg Chew Claritin 10 mg chewable tablet 1 a day  meloxicam (MOBIC) 15 MG tablet Take 1 tablet (15 mg total) by mouth 3 (three) times daily  polyethylene glycol (MIRALAX) powder Take by mouth  predniSONE 5 mg DsPk as directed  sertraline (ZOLOFT) 50 MG tablet sertraline 50 mg tablet  terconazole (TERAZOL 7) 0.4 % vaginal cream terconazole 0.4 % vaginal cream INSERT 1 APPLICATORFUL VAGINALLY EVERY DAY FOR 7 DAYS  traMADoL (ULTRAM) 50 mg tablet TAKE 2 TABLETS BY MOUTH EVERY 6 HOURS AS NEEDED  valACYclovir (VALTREX) 1000 MG tablet valacyclovir 1 gram tablet take 1 tablet po bid x 5 days prn  vitamin E 1000 UNIT capsule Take by mouth   Family History  Problem Relation Age of Onset  Hyperlipidemia (Elevated cholesterol) Mother  Hyperlipidemia (Elevated cholesterol) Father  High blood pressure (Hypertension) Sister  Hyperlipidemia (Elevated cholesterol) Sister    Social History   Tobacco Use  Smoking Status  Former  Types: Cigarettes  Smokeless Tobacco Never  Marital status: Divorced  Tobacco Use  Smoking status: Former  Types: Cigarettes  Smokeless tobacco: Never  Substance and Sexual Activity  Alcohol use: Yes  Drug use: Never   Objective:   Vitals:  01/23/22 0856  BP: 126/78  Pulse: 86  Temp: 36.7 C (98.1 F)  SpO2: 97%  Weight: 77.6 kg (171 lb)   Body mass index is 29.35 kg/m.  Physical Exam Constitutional:   Appearance: Normal appearance.  Chest:  Breasts: Left: No inverted nipple, mass or nipple discharge.  Lymphadenopathy:  Upper Body:  Left upper body: No supraclavicular or axillary adenopathy.  Neurological:  Mental Status: She is alert.    Assessment and Plan:   Atypical lobular hyperplasia of left breast  Left breast seed guided excisional biopsy  We discussed her options. I think while she is on tamoxifen with an increase in size of this it is probably reasonable to consider excision at this point. We discussed a seed guided excisional biopsy. She is agreeable to that. Plan to schedule this sometime in the next couple of months. I told her to stop taking her tamoxifen 1 week before surgery.

## 2022-02-06 NOTE — Anesthesia Postprocedure Evaluation (Signed)
Anesthesia Post Note  Patient: Ardeth Repetto  Procedure(s) Performed: LEFT BREAST SEED GUIDED EXCISIONAL BIOPSY (Left: Breast)     Patient location during evaluation: PACU Anesthesia Type: General Level of consciousness: awake and alert Pain management: pain level controlled Vital Signs Assessment: post-procedure vital signs reviewed and stable Respiratory status: spontaneous breathing, nonlabored ventilation, respiratory function stable and patient connected to nasal cannula oxygen Cardiovascular status: blood pressure returned to baseline and stable Postop Assessment: no apparent nausea or vomiting Anesthetic complications: no   No notable events documented.  Last Vitals:  Vitals:   02/06/22 1715 02/06/22 1730  BP: 106/65   Pulse: 68   Resp: 14   Temp:  36.4 C  SpO2: 99%     Last Pain:  Vitals:   02/06/22 1730  PainSc: 0-No pain                 Barnet Glasgow

## 2022-02-06 NOTE — Anesthesia Procedure Notes (Signed)
Procedure Name: LMA Insertion Date/Time: 02/06/2022 4:14 PM  Performed by: Inda Coke, CRNAPre-anesthesia Checklist: Patient identified, Emergency Drugs available, Suction available, Timeout performed and Patient being monitored Patient Re-evaluated:Patient Re-evaluated prior to induction Oxygen Delivery Method: Circle system utilized Preoxygenation: Pre-oxygenation with 100% oxygen Induction Type: IV induction Ventilation: Mask ventilation without difficulty LMA: LMA inserted LMA Size: 4.0 Tube type: Oral Placement Confirmation: positive ETCO2, CO2 detector and breath sounds checked- equal and bilateral Tube secured with: Tape Dental Injury: Teeth and Oropharynx as per pre-operative assessment

## 2022-02-06 NOTE — Op Note (Signed)
Preoperative diagnosis: left breast ALH Postoperative diagnosis: Same as above Procedure  Left breast seed guided excisional biopsy Surgeon: Dr. Serita Grammes Anesthesia: General  Estimated blood loss: minimal Specimens: Left breast seed guided lumpectomy containing seed and clip marked with paint Complications: None Drains: None Special count was correct completion Disposition to recovery in stable condition  Indications: 56 y.o. female who is seen today for alh. She is well-known to me from earlier in 2022 where she underwent a lumpectomy for DCIS that was estrogen receptor and progesterone receptor positive. She did not proceed with radiation or antiestrogen at that time as she had a family emergency with her father and she took care of him during that time.  Due to her risk she had undergone an MRI recently which shows C density breast. The right side is normal. The lymph nodes are all normal. There are in the left breast in the upper left breast there is some suspicious linear non-mass enhancement measuring 1.6 x 0.6 x 1.1 cm. This has undergone a biopsy which shows this to be lobular neoplasia involving a small sclerotic lesion. she had repeat mri that shows this area is larger now at 2.1x1.3 cm. It underwent a rebiopsy and is still alh. We discussed excisional biopsy  Procedure: After informed consent was obtained the patient was taken to the OR.  She was given antibiotics.  SCDs were in place.  She was placed under general anesthesia without complication.  She was prepped and draped in the standard sterile surgical fashion.  Surgical timeout was then performed.   I identified the seed in the central breast.  I reentered the old periareolar incision.  I then dissected to the seed. I removed the seed and some of surrounding tissue. Mammogram confirmed removal of the seed and the clip.   Hemostasis was observed. I closed the breast tissue with 2-0 vicryl. I did place a clip in the cavity.   I  then closed skin with 3-0 vicryl and 5-0 monocryl. Glue was placed.   she tolerated well, was extubated and transferred to recovery stable

## 2022-02-06 NOTE — Discharge Instructions (Signed)
[facility] Office Phone Number [phone]  POST OP INSTRUCTIONS Take 400 mg of ibuprofen every 8 hours or 650 mg tylenol every 6 hours for next 72 hours then as needed. Use ice several times daily also.  A prescription for pain medication may be given to you upon discharge.  Take your pain medication as prescribed, if needed.  If narcotic pain medicine is not needed, then you may take acetaminophen (Tylenol), naprosyn (Alleve) or ibuprofen (Advil) as needed. Take your usually prescribed medications unless otherwise directed If you need a refill on your pain medication, please contact your pharmacy.  They will contact our office to request authorization.  Prescriptions will not be filled after 5pm or on week-ends. You should eat very light the first 24 hours after surgery, such as soup, crackers, pudding, etc.  Resume your normal diet the day after surgery. Most patients will experience some swelling and bruising in the breast.  Ice packs and a good support bra will help.  Wear the breast binder provided or a sports bra for 72 hours day and night.  After that wear a sports bra during the day until you return to the office. Swelling and bruising can take several days to resolve.  It is common to experience some constipation if taking pain medication after surgery.  Increasing fluid intake and taking a stool softener will usually help or prevent this problem from occurring.  A mild laxative (Milk of Magnesia or Miralax) should be taken according to package directions if there are no bowel movements after 48 hours. I used skin glue on the incision, you may shower in 24 hours.  The glue will flake off over the next 2-3 weeks.  Any sutures or staples will be removed at the office during your follow-up visit. ACTIVITIES:  You may resume regular daily activities (gradually increasing) beginning the next day.  Wearing a good support bra or sports bra minimizes pain and swelling.  You may have  sexual intercourse when it is comfortable. You may drive when you no longer are taking prescription pain medication, you can comfortably wear a seatbelt, and you can safely maneuver your car and apply brakes. RETURN TO WORK:  ______________________________________________________________________________________ You should see your doctor in the office for a follow-up appointment approximately two weeks after your surgery.  Your doctor's nurse will typically make your follow-up appointment when she calls you with your pathology report.  Expect your pathology report 3-4 business days after your surgery.  You may call to check if you do not hear from us after three days. OTHER INSTRUCTIONS: _______________________________________________________________________________________________ _____________________________________________________________________________________________________________________________________ _____________________________________________________________________________________________________________________________________ _____________________________________________________________________________________________________________________________________  WHEN TO CALL DR Laird Runnion: Fever over 101.0 Nausea and/or vomiting. Extreme swelling or bruising. Continued bleeding from incision. Increased pain, redness, or drainage from the incision.  The clinic staff is available to answer your questions during regular business hours.  Please don't hesitate to call and ask to speak to one of the nurses for clinical concerns.  If you have a medical emergency, go to the nearest emergency room or call 911.  A surgeon from [facility] is always on call at the hospital.  For further questions, please visit [email or web address]  

## 2022-02-06 NOTE — Transfer of Care (Signed)
Immediate Anesthesia Transfer of Care Note  Patient: Tara Yates  Procedure(s) Performed: LEFT BREAST SEED GUIDED EXCISIONAL BIOPSY (Left: Breast)  Patient Location: PACU  Anesthesia Type:General  Level of Consciousness: drowsy and patient cooperative  Airway & Oxygen Therapy: Patient Spontanous Breathing and Patient connected to nasal cannula oxygen  Post-op Assessment: Report given to RN, Post -op Vital signs reviewed and stable and Patient moving all extremities  Post vital signs: Reviewed and stable  Last Vitals:  Vitals Value Taken Time  BP 115/67 02/06/22 1704  Temp    Pulse 73 02/06/22 1706  Resp 12 02/06/22 1706  SpO2 97 % 02/06/22 1706  Vitals shown include unvalidated device data.  Last Pain:  Vitals:   02/06/22 1407  PainSc: 0-No pain      Patients Stated Pain Goal: 0 (65/99/35 7017)  Complications: No notable events documented.

## 2022-02-06 NOTE — Interval H&P Note (Signed)
History and Physical Interval Note:  02/06/2022 4:03 PM  Tara Yates  has presented today for surgery, with the diagnosis of LEFT BREAST ATYPICAL LOBULAR HYPERPLASIA.  The various methods of treatment have been discussed with the patient and family. After consideration of risks, benefits and other options for treatment, the patient has consented to  Procedure(s) with comments: LEFT BREAST SEED GUIDED EXCISIONAL BIOPSY (Left) - LMA as a surgical intervention.  The patient's history has been reviewed, patient examined, no change in status, stable for surgery.  I have reviewed the patient's chart and labs.  Questions were answered to the patient's satisfaction.     Rolm Bookbinder

## 2022-02-06 NOTE — Anesthesia Preprocedure Evaluation (Addendum)
Anesthesia Evaluation  Patient identified by MRN, date of birth, ID band Patient awake    Reviewed: Allergy & Precautions, H&P , NPO status , Patient's Chart, lab work & pertinent test results  Airway Mallampati: II  TM Distance: >3 FB Neck ROM: Full    Dental no notable dental hx. (+) Teeth Intact, Dental Advisory Given   Pulmonary neg pulmonary ROS, former smoker,    Pulmonary exam normal breath sounds clear to auscultation       Cardiovascular negative cardio ROS   Rhythm:Regular Rate:Normal     Neuro/Psych Anxiety Depression negative neurological ROS     GI/Hepatic negative GI ROS, Neg liver ROS,   Endo/Other  Hypothyroidism   Renal/GU negative Renal ROS  negative genitourinary   Musculoskeletal   Abdominal   Peds  Hematology negative hematology ROS (+)   Anesthesia Other Findings   Reproductive/Obstetrics negative OB ROS                            Anesthesia Physical Anesthesia Plan  ASA: 2  Anesthesia Plan: General   Post-op Pain Management: Tylenol PO (pre-op)* and Celebrex PO (pre-op)*   Induction: Intravenous  PONV Risk Score and Plan: 4 or greater and Ondansetron, Dexamethasone and Midazolam  Airway Management Planned: LMA  Additional Equipment:   Intra-op Plan:   Post-operative Plan: Extubation in OR  Informed Consent: I have reviewed the patients History and Physical, chart, labs and discussed the procedure including the risks, benefits and alternatives for the proposed anesthesia with the patient or authorized representative who has indicated his/her understanding and acceptance.     Dental advisory given  Plan Discussed with: CRNA  Anesthesia Plan Comments:        Anesthesia Quick Evaluation

## 2022-02-07 ENCOUNTER — Encounter (HOSPITAL_COMMUNITY): Payer: Self-pay | Admitting: General Surgery

## 2022-02-08 ENCOUNTER — Other Ambulatory Visit: Payer: Self-pay

## 2022-02-12 ENCOUNTER — Encounter (INDEPENDENT_AMBULATORY_CARE_PROVIDER_SITE_OTHER): Payer: Self-pay

## 2022-02-12 LAB — SURGICAL PATHOLOGY

## 2022-02-13 ENCOUNTER — Other Ambulatory Visit: Payer: Self-pay | Admitting: *Deleted

## 2022-02-13 DIAGNOSIS — D0512 Intraductal carcinoma in situ of left breast: Secondary | ICD-10-CM

## 2022-03-04 NOTE — Progress Notes (Signed)
Radiation Oncology         ([phone]) [phone] ________________________________  Name: Tara Yates        MRN: [id number]  Date of Service: 03/06/2022 DOB: 07-Apr-1966  QP:[id number], Langley Adie, MD  Rolm Bookbinder, MD     Wearing REFERRING PHYSICIAN: Rolm Bookbinder, MD   DIAGNOSIS: The encounter diagnosis was Ductal carcinoma in situ of left breast.   HISTORY OF PRESENT ILLNESS: Tara Yates is a 56 y.o. female with a  left breast cancer.  The patient was seen for screening mammography in March 2022, but there was a possible distortion seen in the left breast.  Further diagnostic work-up showed no suspicious mass by ultrasound and her left axilla was negative.  Diagnostic mammogram however showed possible subtle distortion in the upper outer quadrant of the left breast and a stereotactic biopsy was obtained on 10/16/2020 showing intermediate grade DCIS that was ER/PR positive.  She was counseled on lumpectomy and underwent left lumpectomy on 12/18/2020 which showed DCIS intermediate grade involving areas of sclerosing adenosis.  Her margins were all negative for DCIS the closest margin being 3 mm, the overall size of the malignant component was not able to be determined, additional lateral and posterior margins were negative for disease.  She was offered adjuvant radiotherapy but declined. She initially declined antiestrogen therapy, but did reconsider this and has been on a low dose of Tamoxifen by Dr. Burr Medico.   She has had continued surveillance with imaging. She had an MRI of the breast on 04/23/2021 that showed suspicious linear non-mass enhancement in the upper inner left breast measuring 1.6 cm.  She underwent biopsy of this area on 04/29/2021 which showed lobular neoplasia consistent with atypical lobular hyperplasia involving a small sclerotic lesion.  She continued in surveillance and another mammogram for clip placement in the left on 07/11/2021 showed a dumbbell shaped marker in  the upper inner breast repeat biopsy on 07/11/2021 of the left breast showed atypical lobular hyperplasia with sclerosing adenosis but no malignancy.  Repeat surveillance mammogram on 09/20/2021 showed postop changes in the left breast and at the site of the barbell clip no suspicious mass was identified she underwent MRI of the breast on 12/19/2021 and again the non-mass enhancement was seen again in the left breast, now this measured up to 2.1 cm.  She returned for MRI guided biopsy of this on 12/27/2021 and this showed again atypical lobular hyper plasia with fibrocystic changes and given that she was on tamoxifen with increase in this size, Dr. Donne Hazel recommended lumpectomy.  She proceeded with this on 02/06/2022 and this revealed intermediate grade DCIS with radial sclerosing lesion and extensive atypical lobular hyperplasia and multiple foci of sclerosing adenosis no invasive disease was present, her cancer was ER/PR positive.  Margins were clear with the closest being the superior margin at 3 mm.  She is seen to reconsider adjuvant radiotherapy.    PREVIOUS RADIATION THERAPY: No   PAST MEDICAL HISTORY:  Past Medical History:  Diagnosis Date   Allergy    Anxiety    Back pain    Breast cancer (HCC)    Constipation    Depression    Ductal carcinoma in situ of left breast 11/09/2020   Family history of breast cancer 11/09/2020   HSV (herpes simplex virus) anogenital infection    Hyperlipidemia    Hypothyroidism    Joint pain    Plantar fasciitis, bilateral    SOBOE (shortness of breath on exertion)  PAST SURGICAL HISTORY: Past Surgical History:  Procedure Laterality Date   BREAST BIOPSY Right 02/28/2015   BREAST BIOPSY Left 07/11/2021   BREAST EXCISIONAL BIOPSY Right 03/29/2015   had seed placement as wel   BREAST LUMPECTOMY WITH RADIOACTIVE SEED LOCALIZATION Left 12/18/2020   Procedure: LEFT BREAST LUMPECTOMY WITH RADIOACTIVE SEED LOCALIZATION;  Surgeon: Rolm Bookbinder,  MD;  Location: [facility];  Service: General;  Laterality: Left;   CARPAL TUNNEL RELEASE Right 2022   ENDOMETRIAL ABLATION     MOUTH SURGERY     RADIOACTIVE SEED GUIDED EXCISIONAL BREAST BIOPSY Right 04/03/2015   Procedure: RADIOACTIVE SEED GUIDED EXCISIONAL BREAST BIOPSY;  Surgeon: Rolm Bookbinder, MD;  Location: [facility];  Service: General;  Laterality: Right;   RADIOACTIVE SEED GUIDED EXCISIONAL BREAST BIOPSY Left 02/06/2022   Procedure: LEFT BREAST SEED GUIDED EXCISIONAL BIOPSY;  Surgeon: Rolm Bookbinder, MD;  Location: [facility];  Service: General;  Laterality: Left;  LMA     FAMILY HISTORY:  Family History  Problem Relation Age of Onset   Heart disease Mother    Heart failure Mother    Heart attack Mother    Hyperlipidemia Mother    Sudden death Mother    Colon polyps Father    Heart disease Father    Hyperlipidemia Father    Hypertension Father    Thyroid disease Father    Sleep apnea Father    Obesity Father    Breast cancer Paternal Grandmother        dx 9s, d. 34   Breast cancer Other        PGM's sisters, x2, dx unknown age   Colon cancer Neg Hx    Esophageal cancer Neg Hx    Rectal cancer Neg Hx    Stomach cancer Neg Hx      SOCIAL HISTORY:  reports that she quit smoking about 23 years ago. Her smoking use included cigarettes. She has a 29.00 pack-year smoking history. She has never used smokeless tobacco. She reports current alcohol use of about 7.0 - 14.0 standard drinks of alcohol per week. She reports that she does not use drugs. The patient is divorced but in a relationship with Mr. Candida Peeling who accompanies her. She has two children who are young adults. 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   ALLERGIES: Patient has no known allergies.   MEDICATIONS:  Current Outpatient Medications  Medication Sig Dispense Refill   levothyroxine (SYNTHROID) 25 MCG tablet TAKE 1 TABLET(25 MCG) BY MOUTH  DAILY BEFORE BREAKFAST 90 tablet 1   tamoxifen (NOLVADEX) 10 MG tablet TAKE 1 TABLET BY MOUTH EVERY DAY 30 tablet 2   No current facility-administered medications for this encounter.     REVIEW OF SYSTEMS: On review of systems, the patient reports that she is doing well overall. She feels like she's healing well also from surgery and is ready to proceed with radiation. No other specific complaints are noted.      PHYSICAL EXAM:  Wt Readings from Last 3 Encounters:  03/06/22 171 lb 9.6 oz (77.8 kg)  02/06/22 167 lb (75.8 kg)  01/31/22 171 lb 14.4 oz (78 kg)   Temp Readings from Last 3 Encounters:  03/06/22 97.9 F (36.6 C) (Temporal)  02/06/22 97.6 F (36.4 C)  01/31/22 98.2 F (36.8 C) (Oral)   BP Readings from Last 3 Encounters:  03/06/22 129/77  02/06/22 106/65  01/31/22 134/65   Pulse Readings from Last 3 Encounters:  03/06/22 61  02/06/22 68  01/31/22 71   Pain Assessment Pain Score: 0-No pain/10  In general this is a well appearing caucasian female in no acute distress. She's alert and oriented x4 and appropriate throughout the examination. Cardiopulmonary assessment is negative for acute distress and she exhibits normal effort.  The left breast reveals a well-healed surgical incision site without erythema, separation, or drainage.    ECOG = 0  0 - Asymptomatic (Fully active, able to carry on all predisease activities without restriction)  1 - Symptomatic but completely ambulatory (Restricted in physically strenuous activity but ambulatory and able to carry out work of a light or sedentary nature. For example, light housework, office work)  2 - Symptomatic, <50% in bed during the day (Ambulatory and capable of all self care but unable to carry out any work activities. Up and about more than 50% of waking hours)  3 - Symptomatic, >50% in bed, but not bedbound (Capable of only limited self-care, confined to bed or chair 50% or more of waking hours)  4 - Bedbound  (Completely disabled. Cannot carry on any self-care. Totally confined to bed or chair)  5 - Death   Eustace Pen MM, Creech RH, Tormey DC, et al. [phone]). "Toxicity and response criteria of the [facility]". [place] Oncol. 5 (6): 649-55    LABORATORY DATA:  Lab Results  Component Value Date   WBC 5.2 01/31/2022   HGB 13.5 01/31/2022   HCT 39.4 01/31/2022   MCV 92.1 01/31/2022   PLT 222 01/31/2022   Lab Results  Component Value Date   NA 142 01/31/2022   K 4.2 01/31/2022   CL 106 01/31/2022   CO2 27 01/31/2022   Lab Results  Component Value Date   ALT 21 08/19/2021   AST 21 08/19/2021   ALKPHOS 55 08/19/2021   BILITOT 0.5 08/19/2021      RADIOGRAPHY: MM Breast Surgical Specimen  Result Date: 02/06/2022 CLINICAL DATA:  Status post seed localized lumpectomy of the LEFT breast. EXAM: SPECIMEN RADIOGRAPH OF THE LEFT BREAST COMPARISON:  Previous exam(s). FINDINGS: Status post excision of the left breast. The radioactive seed and cylinder-shaped biopsy marker clip are present, completely intact, and were marked for pathology. IMPRESSION: Specimen radiograph of the LEFT breast. Electronically Signed   By: Nolon Nations M.D.   On: 02/06/2022 16:48  MM LT RADIOACTIVE SEED LOC MAMMO GUIDE  Result Date: 02/05/2022 CLINICAL DATA:  Radioactive seed localization prior to surgery. EXAM: MAMMOGRAPHIC GUIDED RADIOACTIVE SEED LOCALIZATION OF THE LEFT BREAST COMPARISON:  Previous exam(s). FINDINGS: Patient presents for radioactive seed localization prior to surgery. I met with the patient and we discussed the procedure of seed localization including benefits and alternatives. We discussed the high likelihood of a successful procedure. We discussed the risks of the procedure including infection, bleeding, tissue injury and further surgery. We discussed the low dose of radioactivity involved in the procedure. Informed, written consent was given. The usual time-out protocol was  performed immediately prior to the procedure. Using mammographic guidance, sterile technique, 1% lidocaine and an I-125 radioactive seed, the cylindrical shaped clip was localized using a superior to inferior approach. The follow-up mammogram images confirm the seed in the expected location and were marked for Dr. Donne Hazel. Follow-up survey of the patient confirms presence of the radioactive seed. Order number of I-125 seed:  [id number]. Total activity:  0.737 millicuries reference Date: 7723 The patient tolerated the procedure well and was released from the [facility].  She was given instructions regarding seed removal. IMPRESSION: Radioactive seed localization left breast. No apparent complications. Electronically Signed   By: Lillia Mountain M.D.   On: 02/05/2022 15:16      IMPRESSION/PLAN: 1. Locally recurrent ER/PR positive Intermediate grade DCIS of the left breast. Dr. Lisbeth Renshaw has reviewed her final pathology findings and I reviewed the nature of noninvasive breast disease, and her course to date.  We reviewed the rationale external radiotherapy to the breast  to reduce risks of local recurrence followed by antiestrogen therapy, especially given her history of local recurrence despite antiestrogen therapy. We discussed the risks, benefits, short, and long term effects of radiotherapy, as well as the curative intent, and the patient is interested in moving forward with treatment with Dr. Lisbeth Renshaw. I've discussed the delivery and logistics of radiotherapy and Dr. Ida Rogue recommendation for 4 weeks of radiotherapy with deep inspiration breath hold technique. Written consent is obtained and placed in the chart, a copy was provided to the patient. The patient will simulate today.   In a visit lasting 45 minutes, greater than 50% of the time was spent face to face discussing the patient's condition, in preparation for the discussion, and coordinating the patient's care.      Carola Rhine,  [facility]   **Disclaimer: This note was dictated with voice recognition software. Similar sounding words can inadvertently be transcribed and this note may contain transcription errors which may not have been corrected upon publication of note.**

## 2022-03-06 ENCOUNTER — Ambulatory Visit
Admission: RE | Admit: 2022-03-06 | Discharge: 2022-03-06 | Disposition: A | Discharge status: Home / Self Care | Payer: 59 | Source: Ambulatory Visit | Attending: Radiation Oncology | Admitting: Radiation Oncology

## 2022-03-06 ENCOUNTER — Encounter: Payer: Self-pay | Admitting: Radiation Oncology

## 2022-03-06 ENCOUNTER — Other Ambulatory Visit: Payer: Self-pay

## 2022-03-06 VITALS — BP 129/77 | HR 61 | Temp 97.9°F | Resp 20 | Ht 64.0 in | Wt 171.6 lb

## 2022-03-06 DIAGNOSIS — E039 Hypothyroidism, unspecified: Secondary | ICD-10-CM | POA: Insufficient documentation

## 2022-03-06 DIAGNOSIS — D0512 Intraductal carcinoma in situ of left breast: Secondary | ICD-10-CM | POA: Insufficient documentation

## 2022-03-06 DIAGNOSIS — K5900 Constipation, unspecified: Secondary | ICD-10-CM | POA: Diagnosis not present

## 2022-03-06 DIAGNOSIS — M722 Plantar fascial fibromatosis: Secondary | ICD-10-CM | POA: Diagnosis not present

## 2022-03-06 DIAGNOSIS — Z803 Family history of malignant neoplasm of breast: Secondary | ICD-10-CM | POA: Insufficient documentation

## 2022-03-06 DIAGNOSIS — E785 Hyperlipidemia, unspecified: Secondary | ICD-10-CM | POA: Diagnosis not present

## 2022-03-06 DIAGNOSIS — Z170 Estrogen receptor positive status [ER+]: Secondary | ICD-10-CM | POA: Diagnosis not present

## 2022-03-06 DIAGNOSIS — Z87891 Personal history of nicotine dependence: Secondary | ICD-10-CM | POA: Insufficient documentation

## 2022-03-06 NOTE — Progress Notes (Signed)
Follow-up-new appointment. I verified patient's identity and began nursing interview. Patient reports some moderate itching around incision line on LT breast. No other issues reported at this time.  Meaningful use complete. Postmenopausal- NO chances of regnancy.  BP 129/77 (BP Location: Left Arm, Patient Position: Sitting, Cuff Size: Normal)   Pulse 61   Temp 97.9 F (36.6 C) (Temporal)   Resp 20   Ht '5\' 4"'$  (1.626 m)   Wt 171 lb 9.6 oz (77.8 kg)   LMP 07/07/2018   SpO2 98%   BMI 29.46 kg/m

## 2022-03-12 ENCOUNTER — Encounter (HOSPITAL_COMMUNITY): Payer: Self-pay

## 2022-03-12 DIAGNOSIS — Z510 Encounter for antineoplastic radiation therapy: Secondary | ICD-10-CM | POA: Diagnosis not present

## 2022-03-12 DIAGNOSIS — D0512 Intraductal carcinoma in situ of left breast: Secondary | ICD-10-CM | POA: Insufficient documentation

## 2022-03-18 ENCOUNTER — Ambulatory Visit
Admission: RE | Admit: 2022-03-18 | Discharge: 2022-03-18 | Disposition: A | Discharge status: Home / Self Care | Payer: 59 | Source: Ambulatory Visit | Attending: Radiation Oncology | Admitting: Radiation Oncology

## 2022-03-18 ENCOUNTER — Other Ambulatory Visit: Payer: Self-pay

## 2022-03-18 DIAGNOSIS — Z510 Encounter for antineoplastic radiation therapy: Secondary | ICD-10-CM | POA: Diagnosis not present

## 2022-03-18 LAB — RAD ONC ARIA SESSION SUMMARY
Course Elapsed Days: 0
Plan Fractions Treated to Date: 1
Plan Prescribed Dose Per Fraction: 2.66 Gy
Plan Total Fractions Prescribed: 16
Plan Total Prescribed Dose: 42.56 Gy
Reference Point Dosage Given to Date: 2.66 Gy
Reference Point Session Dosage Given: 2.66 Gy
Session Number: 1

## 2022-03-19 ENCOUNTER — Ambulatory Visit
Admission: RE | Admit: 2022-03-19 | Discharge: 2022-03-19 | Disposition: A | Discharge status: Home / Self Care | Payer: 59 | Source: Ambulatory Visit | Attending: Radiation Oncology | Admitting: Radiation Oncology

## 2022-03-19 ENCOUNTER — Other Ambulatory Visit: Payer: Self-pay

## 2022-03-19 DIAGNOSIS — Z510 Encounter for antineoplastic radiation therapy: Secondary | ICD-10-CM | POA: Diagnosis not present

## 2022-03-19 LAB — RAD ONC ARIA SESSION SUMMARY
Course Elapsed Days: 1
Plan Fractions Treated to Date: 2
Plan Prescribed Dose Per Fraction: 2.66 Gy
Plan Total Fractions Prescribed: 16
Plan Total Prescribed Dose: 42.56 Gy
Reference Point Dosage Given to Date: 5.32 Gy
Reference Point Session Dosage Given: 2.66 Gy
Session Number: 2

## 2022-03-20 ENCOUNTER — Other Ambulatory Visit: Payer: Self-pay

## 2022-03-20 ENCOUNTER — Ambulatory Visit: Payer: 59

## 2022-03-20 ENCOUNTER — Ambulatory Visit
Admission: RE | Admit: 2022-03-20 | Discharge: 2022-03-20 | Disposition: A | Discharge status: Home / Self Care | Payer: 59 | Source: Ambulatory Visit | Attending: Radiation Oncology | Admitting: Radiation Oncology

## 2022-03-20 DIAGNOSIS — Z510 Encounter for antineoplastic radiation therapy: Secondary | ICD-10-CM | POA: Diagnosis not present

## 2022-03-20 LAB — RAD ONC ARIA SESSION SUMMARY
Course Elapsed Days: 2
Plan Fractions Treated to Date: 3
Plan Prescribed Dose Per Fraction: 2.66 Gy
Plan Total Fractions Prescribed: 16
Plan Total Prescribed Dose: 42.56 Gy
Reference Point Dosage Given to Date: 7.98 Gy
Reference Point Session Dosage Given: 2.66 Gy
Session Number: 3

## 2022-03-20 NOTE — Progress Notes (Signed)
Pt here for patient teaching.  Pt given Radiation and You booklet, skin care instructions, alra deodorant and Radiaplex.    Reviewed areas of pertinence such as fatigue, hair loss, skin changes, breast tenderness, and breast swelling. Pt able to give teach back of to pat skin and use unscented/gentle soap,apply Radiaplex bid, avoid applying anything to skin within 4 hours of treatment, avoid wearing an under wire bra, and to use an electric razor if they must shave. Pt verbalizes understanding of information given and will contact nursing with any questions or concerns.    Sadao Weyer M. Carra Brindley RN, BSN  

## 2022-03-21 ENCOUNTER — Ambulatory Visit
Admission: RE | Admit: 2022-03-21 | Discharge: 2022-03-21 | Disposition: A | Discharge status: Home / Self Care | Payer: 59 | Source: Ambulatory Visit | Attending: Radiation Oncology | Admitting: Radiation Oncology

## 2022-03-21 ENCOUNTER — Other Ambulatory Visit: Payer: Self-pay

## 2022-03-21 DIAGNOSIS — D0512 Intraductal carcinoma in situ of left breast: Secondary | ICD-10-CM

## 2022-03-21 DIAGNOSIS — Z510 Encounter for antineoplastic radiation therapy: Secondary | ICD-10-CM | POA: Diagnosis not present

## 2022-03-21 LAB — RAD ONC ARIA SESSION SUMMARY
Course Elapsed Days: 3
Plan Fractions Treated to Date: 4
Plan Prescribed Dose Per Fraction: 2.66 Gy
Plan Total Fractions Prescribed: 16
Plan Total Prescribed Dose: 42.56 Gy
Reference Point Dosage Given to Date: 10.64 Gy
Reference Point Session Dosage Given: 2.66 Gy
Session Number: 4

## 2022-03-21 MED ORDER — RADIAPLEXRX EX GEL: Freq: Once | CUTANEOUS | Status: AC

## 2022-03-24 ENCOUNTER — Other Ambulatory Visit: Payer: Self-pay

## 2022-03-24 ENCOUNTER — Ambulatory Visit
Admission: RE | Admit: 2022-03-24 | Discharge: 2022-03-24 | Disposition: A | Discharge status: Home / Self Care | Payer: 59 | Source: Ambulatory Visit | Attending: Radiation Oncology | Admitting: Radiation Oncology

## 2022-03-24 DIAGNOSIS — Z510 Encounter for antineoplastic radiation therapy: Secondary | ICD-10-CM | POA: Diagnosis not present

## 2022-03-24 LAB — RAD ONC ARIA SESSION SUMMARY
Course Elapsed Days: 6
Plan Fractions Treated to Date: 5
Plan Prescribed Dose Per Fraction: 2.66 Gy
Plan Total Fractions Prescribed: 16
Plan Total Prescribed Dose: 42.56 Gy
Reference Point Dosage Given to Date: 13.3 Gy
Reference Point Session Dosage Given: 2.66 Gy
Session Number: 5

## 2022-03-25 ENCOUNTER — Other Ambulatory Visit: Payer: Self-pay

## 2022-03-25 ENCOUNTER — Ambulatory Visit
Admission: RE | Admit: 2022-03-25 | Discharge: 2022-03-25 | Disposition: A | Discharge status: Home / Self Care | Payer: 59 | Source: Ambulatory Visit | Attending: Radiation Oncology | Admitting: Radiation Oncology

## 2022-03-25 ENCOUNTER — Other Ambulatory Visit: Payer: Self-pay | Admitting: Family Medicine

## 2022-03-25 DIAGNOSIS — E039 Hypothyroidism, unspecified: Secondary | ICD-10-CM

## 2022-03-25 DIAGNOSIS — Z510 Encounter for antineoplastic radiation therapy: Secondary | ICD-10-CM | POA: Diagnosis not present

## 2022-03-25 LAB — RAD ONC ARIA SESSION SUMMARY
Course Elapsed Days: 7
Plan Fractions Treated to Date: 6
Plan Prescribed Dose Per Fraction: 2.66 Gy
Plan Total Fractions Prescribed: 16
Plan Total Prescribed Dose: 42.56 Gy
Reference Point Dosage Given to Date: 15.96 Gy
Reference Point Session Dosage Given: 2.66 Gy
Session Number: 6

## 2022-03-26 ENCOUNTER — Other Ambulatory Visit: Payer: Self-pay

## 2022-03-26 ENCOUNTER — Ambulatory Visit
Admission: RE | Admit: 2022-03-26 | Discharge: 2022-03-26 | Disposition: A | Discharge status: Home / Self Care | Payer: 59 | Source: Ambulatory Visit | Attending: Radiation Oncology | Admitting: Radiation Oncology

## 2022-03-26 DIAGNOSIS — Z510 Encounter for antineoplastic radiation therapy: Secondary | ICD-10-CM | POA: Diagnosis not present

## 2022-03-26 LAB — RAD ONC ARIA SESSION SUMMARY
Course Elapsed Days: 8
Plan Fractions Treated to Date: 7
Plan Prescribed Dose Per Fraction: 2.66 Gy
Plan Total Fractions Prescribed: 16
Plan Total Prescribed Dose: 42.56 Gy
Reference Point Dosage Given to Date: 18.62 Gy
Reference Point Session Dosage Given: 2.66 Gy
Session Number: 7

## 2022-03-27 ENCOUNTER — Other Ambulatory Visit: Payer: Self-pay

## 2022-03-27 ENCOUNTER — Ambulatory Visit
Admission: RE | Admit: 2022-03-27 | Discharge: 2022-03-27 | Disposition: A | Discharge status: Home / Self Care | Payer: 59 | Source: Ambulatory Visit | Attending: Radiation Oncology | Admitting: Radiation Oncology

## 2022-03-27 DIAGNOSIS — Z510 Encounter for antineoplastic radiation therapy: Secondary | ICD-10-CM | POA: Diagnosis not present

## 2022-03-27 LAB — RAD ONC ARIA SESSION SUMMARY
Course Elapsed Days: 9
Plan Fractions Treated to Date: 8
Plan Prescribed Dose Per Fraction: 2.66 Gy
Plan Total Fractions Prescribed: 16
Plan Total Prescribed Dose: 42.56 Gy
Reference Point Dosage Given to Date: 21.28 Gy
Reference Point Session Dosage Given: 2.66 Gy
Session Number: 8

## 2022-03-28 ENCOUNTER — Ambulatory Visit
Admission: RE | Admit: 2022-03-28 | Discharge: 2022-03-28 | Disposition: A | Discharge status: Home / Self Care | Payer: 59 | Source: Ambulatory Visit | Attending: Radiation Oncology | Admitting: Radiation Oncology

## 2022-03-28 ENCOUNTER — Other Ambulatory Visit: Payer: Self-pay

## 2022-03-28 DIAGNOSIS — Z510 Encounter for antineoplastic radiation therapy: Secondary | ICD-10-CM | POA: Diagnosis not present

## 2022-03-28 LAB — RAD ONC ARIA SESSION SUMMARY
Course Elapsed Days: 10
Plan Fractions Treated to Date: 9
Plan Prescribed Dose Per Fraction: 2.66 Gy
Plan Total Fractions Prescribed: 16
Plan Total Prescribed Dose: 42.56 Gy
Reference Point Dosage Given to Date: 23.94 Gy
Reference Point Session Dosage Given: 2.66 Gy
Session Number: 9

## 2022-03-31 ENCOUNTER — Other Ambulatory Visit: Payer: Self-pay

## 2022-03-31 ENCOUNTER — Ambulatory Visit
Admission: RE | Admit: 2022-03-31 | Discharge: 2022-03-31 | Disposition: A | Discharge status: Home / Self Care | Payer: 59 | Source: Ambulatory Visit | Attending: Radiation Oncology | Admitting: Radiation Oncology

## 2022-03-31 DIAGNOSIS — D0512 Intraductal carcinoma in situ of left breast: Secondary | ICD-10-CM

## 2022-03-31 DIAGNOSIS — Z510 Encounter for antineoplastic radiation therapy: Secondary | ICD-10-CM | POA: Diagnosis not present

## 2022-03-31 LAB — RAD ONC ARIA SESSION SUMMARY
Course Elapsed Days: 13
Plan Fractions Treated to Date: 10
Plan Prescribed Dose Per Fraction: 2.66 Gy
Plan Total Fractions Prescribed: 16
Plan Total Prescribed Dose: 42.56 Gy
Reference Point Dosage Given to Date: 26.6 Gy
Reference Point Session Dosage Given: 2.66 Gy
Session Number: 10

## 2022-04-01 ENCOUNTER — Encounter: Payer: Self-pay | Admitting: Nurse Practitioner

## 2022-04-01 ENCOUNTER — Inpatient Hospital Stay: Payer: 59 | Attending: Nurse Practitioner

## 2022-04-01 ENCOUNTER — Other Ambulatory Visit: Payer: Self-pay

## 2022-04-01 ENCOUNTER — Inpatient Hospital Stay (HOSPITAL_BASED_OUTPATIENT_CLINIC_OR_DEPARTMENT_OTHER): Payer: 59 | Admitting: Nurse Practitioner

## 2022-04-01 ENCOUNTER — Ambulatory Visit
Admission: RE | Admit: 2022-04-01 | Discharge: 2022-04-01 | Disposition: A | Discharge status: Home / Self Care | Payer: 59 | Source: Ambulatory Visit | Attending: Radiation Oncology | Admitting: Radiation Oncology

## 2022-04-01 VITALS — BP 109/74 | HR 64 | Temp 98.0°F | Resp 18 | Ht 64.0 in | Wt 170.3 lb

## 2022-04-01 DIAGNOSIS — D0512 Intraductal carcinoma in situ of left breast: Secondary | ICD-10-CM

## 2022-04-01 DIAGNOSIS — Z510 Encounter for antineoplastic radiation therapy: Secondary | ICD-10-CM | POA: Diagnosis not present

## 2022-04-01 LAB — CMP (CANCER CENTER ONLY)
ALT: 19 U/L (ref 0–44)
AST: 16 U/L (ref 15–41)
Albumin: 4.4 g/dL (ref 3.5–5.0)
Alkaline Phosphatase: 45 U/L (ref 38–126)
Anion gap: 7 (ref 5–15)
BUN: 16 mg/dL (ref 6–20)
CO2: 27 mmol/L (ref 22–32)
Calcium: 8.8 mg/dL — ABNORMAL LOW (ref 8.9–10.3)
Chloride: 103 mmol/L (ref 98–111)
Creatinine: 0.72 mg/dL (ref 0.44–1.00)
GFR, Estimated: >60 mL/min (ref >60)
Glucose, Bld: 110 mg/dL — ABNORMAL HIGH (ref 70–99)
Potassium: 4.2 mmol/L (ref 3.5–5.1)
Sodium: 137 mmol/L (ref 135–145)
Total Bilirubin: 0.5 mg/dL (ref 0.3–1.2)
Total Protein: 7 g/dL (ref 6.5–8.1)

## 2022-04-01 LAB — CBC WITH DIFFERENTIAL (CANCER CENTER ONLY)
Abs Immature Granulocytes: 0.01 10*3/uL (ref 0.00–0.07)
Basophils Absolute: 0 10*3/uL (ref 0.0–0.1)
Basophils Relative: 1 %
Eosinophils Absolute: 0.2 10*3/uL (ref 0.0–0.5)
Eosinophils Relative: 4 %
HCT: 37.6 % (ref 36.0–46.0)
Hemoglobin: 13.3 g/dL (ref 12.0–15.0)
Immature Granulocytes: 0 %
Lymphocytes Relative: 22 %
Lymphs Abs: 0.9 10*3/uL (ref 0.7–4.0)
MCH: 32 pg (ref 26.0–34.0)
MCHC: 35.4 g/dL (ref 30.0–36.0)
MCV: 90.4 fL (ref 80.0–100.0)
Monocytes Absolute: 0.4 10*3/uL (ref 0.1–1.0)
Monocytes Relative: 10 %
Neutro Abs: 2.6 10*3/uL (ref 1.7–7.7)
Neutrophils Relative %: 63 %
Platelet Count: 200 10*3/uL (ref 150–400)
RBC: 4.16 MIL/uL (ref 3.87–5.11)
RDW: 12 % (ref 11.5–15.5)
WBC Count: 4.1 10*3/uL (ref 4.0–10.5)
nRBC: 0 % (ref 0.0–0.2)

## 2022-04-01 LAB — RAD ONC ARIA SESSION SUMMARY
Course Elapsed Days: 14
Plan Fractions Treated to Date: 11
Plan Prescribed Dose Per Fraction: 2.66 Gy
Plan Total Fractions Prescribed: 16
Plan Total Prescribed Dose: 42.56 Gy
Reference Point Dosage Given to Date: 29.26 Gy
Reference Point Session Dosage Given: 2.66 Gy
Session Number: 11

## 2022-04-01 MED ORDER — TAMOXIFEN CITRATE 10 MG PO TABS: 5.0000 mg | ORAL_TABLET | Freq: Every day | ORAL | 3 refills | Status: DC

## 2022-04-01 NOTE — Progress Notes (Signed)
[place]   [phone] [phone]   Clinic Follow up Note   Patient Care Team: Billie Ruddy, MD as PCP - General (Family Medicine) Mauro Kaufmann, RN as Oncology Nurse Navigator Rockwell Germany, RN as Oncology Nurse Navigator Rolm Bookbinder, MD as Consulting Physician (General Surgery) Truitt Merle, MD as Consulting Physician (Hematology) Kyung Rudd, MD as Consulting Physician (Radiation Oncology) Alla Feeling, NP as Nurse Practitioner (Nurse Practitioner) 04/01/2022  CHIEF COMPLAINT: Follow-up left breast DCIS 2022 and new left breast DCIS 02/2022  SUMMARY OF ONCOLOGIC HISTORY: Oncology History  Ductal carcinoma in situ of left breast  10/16/2020 Pathology Results   Breast, left, needle core biopsy, UOQ posterior - DUCTAL CARCINOMA IN SITU WITH CALCIFICATIONS, INTERMEDIATE NUCLEAR GRADE  PROGNOSTIC INDICATORS Results: IMMUNOHISTOCHEMICAL AND MORPHOMETRIC ANALYSIS PERFORMED MANUALLY Estrogen Receptor: 95%, POSITIVE, STRONG STAINING INTENSITY Progesterone Receptor: 20%, POSITIVE, STRONG STAINING INTENSITY   11/09/2020 Initial Diagnosis   Ductal carcinoma in situ of left breast   11/16/2020 Genetic Testing   Negative hereditary cancer genetic testing: no pathogenic variants detected in [facility] BRCAPlus Panel.  The report date is Nov 16, 2020. The BRCAplus panel offered by [employer] and includes sequencing and deletion/duplication analysis for the following 8 genes: ATM, BRCA1, BRCA2, CDH1, CHEK2, PALB2, PTEN, and TP53.    Negative hereditary cancer genetic testing: no pathogenic variants detected in [facility] CustomNext-Cancer +RNAinsight Panel.  Variant of uncertain significance detected in CDKN2A at c.440C>T (p.A147V). The report date is Nov 26, 2020.   The CustomNext-Cancer+RNAinsight panel offered by [facility] includes sequencing and rearrangement analysis for the following 47 genes:  APC, ATM, AXIN2, BARD1, BMPR1A, BRCA1, BRCA2,  BRIP1, CDH1, CDK4, CDKN2A, CHEK2, DICER1, EPCAM, GREM1, HOXB13, MEN1, MLH1, MSH2, MSH3, MSH6, MUTYH, NBN, NF1, NF2, NTHL1, PALB2, PMS2, POLD1, POLE, PTEN, RAD51C, RAD51D, RECQL, RET, SDHA, SDHAF2, SDHB, SDHC, SDHD, SMAD4, SMARCA4, STK11, TP53, TSC1, TSC2, and VHL.  RNA data is routinely analyzed for use in variant interpretation for all genes.    12/18/2020 Surgery   Left Lumpectomy  FINAL MICROSCOPIC DIAGNOSIS:   A. BREAST, LEFT, LUMPECTOMY:  - Ductal carcinoma in situ, intermediate grade, involving areas of  sclerosing adenosis, see comment  - Resection margins are negative for DCIS; closest is the posterior  margin at 3 mm  - Biopsy site changes  - Calcifications  - See oncology table   B. BREAST, LEFT ADDITIONAL LATERAL MARGIN, EXCISION:  - Fibrocystic change with sclerosing adenosis and calcifications  - Negative for carcinoma   C. BREAST, LEFT ADDITIONAL POSTERIOR MARGIN, EXCISION:  - Benign breast parenchyma, negative for carcinoma    12/18/2020 Cancer Staging   Staging form: Breast, AJCC 8th Edition - Pathologic stage from 12/18/2020: Stage Unknown (pTis (DCIS), pNX, cM0, G2, ER+, PR+, HER2: Not Assessed) - Signed by Alla Feeling, NP on 06/27/2021 Stage prefix: Initial diagnosis Histologic grading system: 3 grade system   06/27/2021 Survivorship   SCP delivered by Cira Rue, NP   07/2021 -  Anti-estrogen oral therapy   Pending start of chemoprevention with tamoxifen in 07/2021    12/19/2021 Imaging   IMPRESSION: Breast MRI impression:  1. Area of non mass enhancement in the left breast has increased in size from the prior MRI. It lies just below cylinder shaped biopsy clip. Given the position of the post biopsy marker clip and the interval increase in size of this enhancement, re-biopsy is recommended. 2. No other areas of abnormal enhancement in the left breast. 3. No  evidence of right breast malignancy.   RECOMMENDATION: 1. MRI guided core needle biopsy of the  area of non mass enhancement in the left breast.   12/27/2021 Pathology Results   Diagnosis Breast, left, needle core biopsy, upper inner quadrant, cylinder clip - LOBULAR NEOPLASIA (ATYPICAL LOBULAR HYPERPLASIA). - FIBROCYSTIC CHANGES WITH USUAL DUCTAL HYPERPLASIA AND CALCIFICATIONS. - SEE NOTE.   02/06/2022 Surgery   Left breast lumpectomy by Dr. Serita Grammes   02/06/2022 Pathology Results   FINAL MICROSCOPIC DIAGNOSIS: A. BREAST, LEFT, LUMPECTOMY: Ductal carcinoma in-situ, apocrine type with clear margins of resection. Radial sclerosing lesion with extensive atypical lobular hyperplasia in the immediate vicinity and elsewhere in the breast. Multiple foci of sclerosing adenosis. Invasive carcinoma is not identified.  Estrogen receptors and progesterone receptors both negative   03/18/2022 -  Radiation Therapy   Adjuvant radiation by Dr. Lisbeth Renshaw, plan to complete 04/15/2022     CURRENT THERAPY: Tamoxifen 10 mg daily started 06/2022, dose reduced to 5 mg daily 09/23/2021 due to SEs  INTERVAL HISTORY: Tara Yates returns for follow-up as scheduled, last seen by Dr. Burr Medico 09/23/2021 for routine surveillance.  Due to joint pain and fatigue tamoxifen was reduced to 5 mg.  Screening breast MRI 12/19/2021 showed enlarging non-mass enhancement in the upper inner left breast middle depth.  Biopsy showed atypical lobular hyperplasia which was felt to be concordant.  She was seen by Dr. Donne Hazel and proceeded with left lumpectomy on 02/06/2022, path showed DCIS with focal micropapillary features, grade 2, with clear margins, ER/PR negative.  She is currently undergoing radiation by Dr. Lisbeth Renshaw, started 03/18/2022. Today begins week 3, she started to feel the effects in her left breast with redness and discomfort especially when it gets hot. She is not fatigued, tries to walk daily. Tolerating tamoxifen 5 mg, doesn't notice joint pain with NSAID prn. Hot flashes from menopause are stable.   All other systems  were reviewed with the patient and are negative.  MEDICAL HISTORY:  Past Medical History:  Diagnosis Date   Allergy    Anxiety    Back pain    Breast cancer ([place])    Constipation    Depression    Ductal carcinoma in situ of left breast 11/09/2020   Family history of breast cancer 11/09/2020   HSV (herpes simplex virus) anogenital infection    Hyperlipidemia    Hypothyroidism    Joint pain    Plantar fasciitis, bilateral    SOBOE (shortness of breath on exertion)     SURGICAL HISTORY: Past Surgical History:  Procedure Laterality Date   BREAST BIOPSY Right 02/28/2015   BREAST BIOPSY Left 07/11/2021   BREAST EXCISIONAL BIOPSY Right 03/29/2015   had seed placement as wel   BREAST LUMPECTOMY WITH RADIOACTIVE SEED LOCALIZATION Left 12/18/2020   Procedure: LEFT BREAST LUMPECTOMY WITH RADIOACTIVE SEED LOCALIZATION;  Surgeon: Rolm Bookbinder, MD;  Location: [facility];  Service: General;  Laterality: Left;   CARPAL TUNNEL RELEASE Right 2022   ENDOMETRIAL ABLATION     MOUTH SURGERY     RADIOACTIVE SEED GUIDED EXCISIONAL BREAST BIOPSY Right 04/03/2015   Procedure: RADIOACTIVE SEED GUIDED EXCISIONAL BREAST BIOPSY;  Surgeon: Rolm Bookbinder, MD;  Location: [facility];  Service: General;  Laterality: Right;   RADIOACTIVE SEED GUIDED EXCISIONAL BREAST BIOPSY Left 02/06/2022   Procedure: LEFT BREAST SEED GUIDED EXCISIONAL BIOPSY;  Surgeon: Rolm Bookbinder, MD;  Location: [facility];  Service: General;  Laterality: Left;  LMA    I have reviewed  the social history and family history with the patient and they are unchanged from previous note.  ALLERGIES:  has No Known Allergies.  MEDICATIONS:  Current Outpatient Medications  Medication Sig Dispense Refill   levothyroxine (SYNTHROID) 25 MCG tablet TAKE 1 TABLET(25 MCG) BY MOUTH DAILY BEFORE BREAKFAST 90 tablet 1   tamoxifen (NOLVADEX) 10 MG tablet Take 0.5 tablets (5 mg total) by mouth daily. 30 tablet 3    No current facility-administered medications for this visit.    PHYSICAL EXAMINATION: ECOG PERFORMANCE STATUS: 1 - Symptomatic but completely ambulatory  Vitals:   04/01/22 0951  BP: 109/74  Pulse: 64  Resp: 18  Temp: 98 F (36.7 C)  SpO2: 99%   Filed Weights   04/01/22 0951  Weight: 170 lb 4.8 oz (77.2 kg)    GENERAL:alert, no distress and comfortable SKIN: no rash  EYES: sclera clear LUNGS:  normal breathing effort HEART: no lower extremity edema NEURO: alert & oriented x 3 with fluent speech, no focal motor/sensory deficits Breast exam limited to observation, left breast with mild radiation dermatitis  LABORATORY DATA:  I have reviewed the data as listed    Latest Ref Rng & Units 04/01/2022    9:15 AM 01/31/2022    2:58 PM 08/19/2021   11:13 AM  CBC  WBC 4.0 - 10.5 K/uL 4.1  5.2  4.3   Hemoglobin 12.0 - 15.0 g/dL 13.3  13.5  13.4   Hematocrit 36.0 - 46.0 % 37.6  39.4  39.8   Platelets 150 - 400 K/uL 200  222  215.0         Latest Ref Rng & Units 04/01/2022    9:15 AM 01/31/2022    2:58 PM 08/19/2021   11:13 AM  CMP  Glucose 70 - 99 mg/dL 110  102  86   BUN 6 - 20 mg/dL _0 Creatinine 0.44 - 1.00 mg/dL 0.72  0.89  0.66   Sodium 135 - 145 mmol/L 137  142  140   Potassium 3.5 - 5.1 mmol/L 4.2  4.2  4.1   Chloride 98 - 111 mmol/L 103  106  103   CO2 22 - 32 mmol/L _1 Calcium 8.9 - 10.3 mg/dL 8.8  9.2  9.1   Total Protein 6.5 - 8.1 g/dL 7.0   7.1   Total Bilirubin 0.3 - 1.2 mg/dL 0.5   0.5   Alkaline Phos 38 - 126 U/L 45   55   AST 15 - 41 U/L 16   21   ALT 0 - 44 U/L 19   21       RADIOGRAPHIC STUDIES: I have personally reviewed the radiological images as listed and agreed with the findings in the report. No results found.   ASSESSMENT & PLAN: Sitlali Koerner is a 56 y.o. post-menopausal female with    New left breast DCIS, grade 2, ER/PR - -found on screening breast MRI 12/19/21 which was monitoring ALH (left breast biopsies  04/09/21 and 07/11/21); one of the abnormal areas had enlarged since the previous MRI and was rebiopsied 12/27/2021 again showing [facility] -Given the enlargement she underwent left lumpectomy by Dr. Donne Hazel 02/06/22, path showed grade 2 DCIS, apocrine type, with clear margins and extensive ALH within the breast.  ER/PR negative -This is a new DCIS, not a recurrence, but has been completely removed in surgery which was the curative treatment -She began adjuvant radiation by Dr.  Moody 03/18/2022, plan to complete 04/15/2022 -We again discussed the rationale for antiestrogen therapy which is to reduce the risk of ER positive breast cancer.  The recommendation is to continue the current dose 5 mg daily and remain in the high risk screening program which includes annual mammogram and breast MRI staggered 6 months apart; patient agrees -Follow-up in 3 months, or sooner if needed -I discussed the case with Dr. Burr Medico   2. Left breast DCIS, grade 2, ER/PR+ -Screening detected; Biopsy on 10/16/20 confirmed intermediate grade DCIS, ER/PR+. -S/p left lumpectomy on 12/18/20 showed grade 2 DCIS with clear margins  -She declined adjuvant radiation due to family situation, she needed to be in [place] to take care of her father -she eventually started Tamoxifen at survivorship visit 06/27/21. She developed joint pain about a month later, and her dose was dropped to 10 mg. She had fatigue, joint pain, and hot flashes and dose was further reduced to 5 mg 09/2021, tolerating much better -she has had two left breast biopsies, on 04/29/21 and 07/11/21, both showing ALH. She continues surveillance with alternating mammography and breast MRI staggered 6 months apart. -next mammogram 09/2022, then MRI 03/2023 -continue f/up with Dr. Donne Hazel and surveillance with [place]    3. Negative genetic testing 11/08/20 - VUS in CDKN2A  Plan: -Today's labs, recent imaging, and surgical path reviewed.  She has a new left breast DCIS diagnosed 02/06/2022  s/p lumpectomy and is receiving adjuvant radiation by Dr. Lisbeth Renshaw, plan to complete 04/15/2022 -Continue low-dose tamoxifen 5 mg daily, refilled -High risk screening program, next mammo 09/2022 and breast MRI 03/2023 -Follow-up in 3 months, or sooner if needed   All questions were answered. The patient knows to call the clinic with any problems, questions or concerns. No barriers to learning was detected. I spent 20 minutes counseling the patient face to face. The total time spent in the appointment was 30 minutes and more than 50% was on counseling and review of test results.     Alla Feeling, NP 04/01/22

## 2022-04-02 ENCOUNTER — Other Ambulatory Visit: Payer: Self-pay

## 2022-04-02 ENCOUNTER — Ambulatory Visit
Admission: RE | Admit: 2022-04-02 | Discharge: 2022-04-02 | Disposition: A | Discharge status: Home / Self Care | Payer: 59 | Source: Ambulatory Visit | Attending: Radiation Oncology | Admitting: Radiation Oncology

## 2022-04-02 DIAGNOSIS — Z510 Encounter for antineoplastic radiation therapy: Secondary | ICD-10-CM | POA: Diagnosis not present

## 2022-04-02 LAB — RAD ONC ARIA SESSION SUMMARY
Course Elapsed Days: 15
Plan Fractions Treated to Date: 12
Plan Prescribed Dose Per Fraction: 2.66 Gy
Plan Total Fractions Prescribed: 16
Plan Total Prescribed Dose: 42.56 Gy
Reference Point Dosage Given to Date: 31.92 Gy
Reference Point Session Dosage Given: 2.66 Gy
Session Number: 12

## 2022-04-03 ENCOUNTER — Other Ambulatory Visit: Payer: Self-pay

## 2022-04-03 ENCOUNTER — Ambulatory Visit
Admission: RE | Admit: 2022-04-03 | Discharge: 2022-04-03 | Disposition: A | Discharge status: Home / Self Care | Payer: 59 | Source: Ambulatory Visit | Attending: Radiation Oncology | Admitting: Radiation Oncology

## 2022-04-03 DIAGNOSIS — Z510 Encounter for antineoplastic radiation therapy: Secondary | ICD-10-CM | POA: Diagnosis not present

## 2022-04-03 LAB — RAD ONC ARIA SESSION SUMMARY
Course Elapsed Days: 16
Plan Fractions Treated to Date: 13
Plan Prescribed Dose Per Fraction: 2.66 Gy
Plan Total Fractions Prescribed: 16
Plan Total Prescribed Dose: 42.56 Gy
Reference Point Dosage Given to Date: 34.58 Gy
Reference Point Session Dosage Given: 2.66 Gy
Session Number: 13

## 2022-04-04 ENCOUNTER — Ambulatory Visit: Payer: 59

## 2022-04-04 ENCOUNTER — Ambulatory Visit: Payer: 59 | Admitting: Radiation Oncology

## 2022-04-04 DIAGNOSIS — Z510 Encounter for antineoplastic radiation therapy: Secondary | ICD-10-CM | POA: Diagnosis not present

## 2022-04-07 ENCOUNTER — Ambulatory Visit: Admission: RE | Admit: 2022-04-07 | Payer: 59 | Source: Ambulatory Visit

## 2022-04-08 ENCOUNTER — Other Ambulatory Visit: Payer: Self-pay

## 2022-04-08 ENCOUNTER — Ambulatory Visit
Admission: RE | Admit: 2022-04-08 | Discharge: 2022-04-08 | Disposition: A | Discharge status: Home / Self Care | Payer: 59 | Source: Ambulatory Visit | Attending: Radiation Oncology | Admitting: Radiation Oncology

## 2022-04-08 DIAGNOSIS — D0512 Intraductal carcinoma in situ of left breast: Secondary | ICD-10-CM | POA: Insufficient documentation

## 2022-04-08 LAB — RAD ONC ARIA SESSION SUMMARY
Course Elapsed Days: 21
Plan Fractions Treated to Date: 14
Plan Prescribed Dose Per Fraction: 2.66 Gy
Plan Total Fractions Prescribed: 16
Plan Total Prescribed Dose: 42.56 Gy
Reference Point Dosage Given to Date: 37.24 Gy
Reference Point Session Dosage Given: 2.66 Gy
Session Number: 14

## 2022-04-09 ENCOUNTER — Other Ambulatory Visit: Payer: Self-pay

## 2022-04-09 ENCOUNTER — Ambulatory Visit: Payer: 59

## 2022-04-09 ENCOUNTER — Ambulatory Visit
Admission: RE | Admit: 2022-04-09 | Discharge: 2022-04-09 | Disposition: A | Discharge status: Home / Self Care | Payer: 59 | Source: Ambulatory Visit | Attending: Radiation Oncology | Admitting: Radiation Oncology

## 2022-04-09 DIAGNOSIS — D0512 Intraductal carcinoma in situ of left breast: Secondary | ICD-10-CM | POA: Diagnosis not present

## 2022-04-09 LAB — RAD ONC ARIA SESSION SUMMARY
Course Elapsed Days: 22
Plan Fractions Treated to Date: 15
Plan Prescribed Dose Per Fraction: 2.66 Gy
Plan Total Fractions Prescribed: 16
Plan Total Prescribed Dose: 42.56 Gy
Reference Point Dosage Given to Date: 39.9 Gy
Reference Point Session Dosage Given: 2.66 Gy
Session Number: 15

## 2022-04-10 ENCOUNTER — Ambulatory Visit: Payer: 59

## 2022-04-10 ENCOUNTER — Other Ambulatory Visit: Payer: Self-pay

## 2022-04-10 ENCOUNTER — Ambulatory Visit
Admission: RE | Admit: 2022-04-10 | Discharge: 2022-04-10 | Disposition: A | Discharge status: Home / Self Care | Payer: 59 | Source: Ambulatory Visit | Attending: Radiation Oncology | Admitting: Radiation Oncology

## 2022-04-10 DIAGNOSIS — D0512 Intraductal carcinoma in situ of left breast: Secondary | ICD-10-CM | POA: Diagnosis not present

## 2022-04-10 LAB — RAD ONC ARIA SESSION SUMMARY
Course Elapsed Days: 23
Plan Fractions Treated to Date: 16
Plan Prescribed Dose Per Fraction: 2.66 Gy
Plan Total Fractions Prescribed: 16
Plan Total Prescribed Dose: 42.56 Gy
Reference Point Dosage Given to Date: 42.56 Gy
Reference Point Session Dosage Given: 2.66 Gy
Session Number: 16

## 2022-04-11 ENCOUNTER — Ambulatory Visit: Payer: 59

## 2022-04-11 ENCOUNTER — Ambulatory Visit
Admission: RE | Admit: 2022-04-11 | Discharge: 2022-04-11 | Disposition: A | Discharge status: Home / Self Care | Payer: 59 | Source: Ambulatory Visit | Attending: Radiation Oncology | Admitting: Radiation Oncology

## 2022-04-11 ENCOUNTER — Other Ambulatory Visit: Payer: Self-pay

## 2022-04-11 DIAGNOSIS — D0512 Intraductal carcinoma in situ of left breast: Secondary | ICD-10-CM | POA: Diagnosis not present

## 2022-04-11 LAB — RAD ONC ARIA SESSION SUMMARY
Course Elapsed Days: 24
Plan Fractions Treated to Date: 1
Plan Prescribed Dose Per Fraction: 2 Gy
Plan Total Fractions Prescribed: 4
Plan Total Prescribed Dose: 8 Gy
Reference Point Dosage Given to Date: 2 Gy
Reference Point Session Dosage Given: 2 Gy
Session Number: 17

## 2022-04-14 ENCOUNTER — Ambulatory Visit: Payer: 59

## 2022-04-14 ENCOUNTER — Ambulatory Visit
Admission: RE | Admit: 2022-04-14 | Discharge: 2022-04-14 | Disposition: A | Discharge status: Home / Self Care | Payer: 59 | Source: Ambulatory Visit | Attending: Radiation Oncology | Admitting: Radiation Oncology

## 2022-04-14 ENCOUNTER — Other Ambulatory Visit: Payer: Self-pay

## 2022-04-14 DIAGNOSIS — D0512 Intraductal carcinoma in situ of left breast: Secondary | ICD-10-CM | POA: Diagnosis not present

## 2022-04-14 LAB — RAD ONC ARIA SESSION SUMMARY
Course Elapsed Days: 27
Plan Fractions Treated to Date: 2
Plan Prescribed Dose Per Fraction: 2 Gy
Plan Total Fractions Prescribed: 4
Plan Total Prescribed Dose: 8 Gy
Reference Point Dosage Given to Date: 4 Gy
Reference Point Session Dosage Given: 2 Gy
Session Number: 18

## 2022-04-15 ENCOUNTER — Ambulatory Visit
Admission: RE | Admit: 2022-04-15 | Discharge: 2022-04-15 | Disposition: A | Discharge status: Home / Self Care | Payer: 59 | Source: Ambulatory Visit | Attending: Radiation Oncology | Admitting: Radiation Oncology

## 2022-04-15 ENCOUNTER — Ambulatory Visit: Payer: 59

## 2022-04-15 ENCOUNTER — Other Ambulatory Visit: Payer: Self-pay

## 2022-04-15 DIAGNOSIS — D0512 Intraductal carcinoma in situ of left breast: Secondary | ICD-10-CM | POA: Diagnosis not present

## 2022-04-15 LAB — RAD ONC ARIA SESSION SUMMARY
Course Elapsed Days: 28
Plan Fractions Treated to Date: 3
Plan Prescribed Dose Per Fraction: 2 Gy
Plan Total Fractions Prescribed: 4
Plan Total Prescribed Dose: 8 Gy
Reference Point Dosage Given to Date: 6 Gy
Reference Point Session Dosage Given: 2 Gy
Session Number: 19

## 2022-04-16 ENCOUNTER — Ambulatory Visit
Admission: RE | Admit: 2022-04-16 | Discharge: 2022-04-16 | Disposition: A | Discharge status: Home / Self Care | Payer: 59 | Source: Ambulatory Visit | Attending: Radiation Oncology | Admitting: Radiation Oncology

## 2022-04-16 ENCOUNTER — Other Ambulatory Visit: Payer: Self-pay

## 2022-04-16 ENCOUNTER — Encounter: Payer: Self-pay | Admitting: Radiation Oncology

## 2022-04-16 DIAGNOSIS — D0512 Intraductal carcinoma in situ of left breast: Secondary | ICD-10-CM | POA: Diagnosis not present

## 2022-04-16 LAB — RAD ONC ARIA SESSION SUMMARY
Course Elapsed Days: 29
Plan Fractions Treated to Date: 4
Plan Prescribed Dose Per Fraction: 2 Gy
Plan Total Fractions Prescribed: 4
Plan Total Prescribed Dose: 8 Gy
Reference Point Dosage Given to Date: 8 Gy
Reference Point Session Dosage Given: 2 Gy
Session Number: 20

## 2022-04-18 ENCOUNTER — Telehealth: Payer: Self-pay | Admitting: Nurse Practitioner

## 2022-04-18 NOTE — Telephone Encounter (Signed)
Scheduled follow-up appointment per 9/26 los. Patient is aware.

## 2022-05-01 NOTE — Progress Notes (Signed)
                                                                                                                                                             Patient Name: Tara Yates MRN: [id number] DOB: 1966/05/15 Referring Physician: Grier Mitts Date of Service: 04/16/2022 [facility], [place]                                                        End Of Treatment Note  Diagnoses: D05.12-Intraductal carcinoma in situ of left breast  Cancer Staging:  Locally recurrent ER/PR positive Intermediate grade DCIS of the left breast   Intent: Curative  Radiation Treatment Dates: 03/18/2022 through 04/16/2022 Site Technique Total Dose (Gy) Dose per Fx (Gy) Completed Fx Beam Energies  Breast, Left: Breast_L 3D 42.56/42.56 2.66 16/16 10X, 6XFFF  Breast, Left: Breast_L_Bst 3D 8/8 2 4/4 6X, 10X   Narrative: The patient tolerated radiation therapy relatively well. She developed fatigue and anticipated skin changes in the treatment field. No desquamation was noted at the conclusion of therapy.  Plan: The patient will receive a call in about one month from the radiation oncology department. She will continue follow up with Dr. Burr Medico as well.   ________________________________________________    Carola Rhine, [facility]

## 2022-05-26 ENCOUNTER — Ambulatory Visit
Admission: RE | Admit: 2022-05-26 | Discharge: 2022-05-26 | Disposition: A | Discharge status: Home / Self Care | Payer: 59 | Source: Ambulatory Visit | Attending: Radiation Oncology | Admitting: Radiation Oncology

## 2022-05-26 NOTE — Progress Notes (Signed)
  Radiation Oncology         [phone] ________________________________  Name: Tara Yates MRN: [id number]  Date of Service: 05/26/2022  DOB: 05-05-1966  Post Treatment Telephone Note  Diagnosis:  Locally recurrent ER/PR positive Intermediate grade DCIS of the left breast   Intent: Curative  Radiation Treatment Dates: 03/18/2022 through 04/16/2022 Site Technique Total Dose (Gy) Dose per Fx (Gy) Completed Fx Beam Energies  Breast, Left: Breast_L 3D 42.56/42.56 2.66 16/16 10X, 6XFFF  Breast, Left: Breast_L_Bst 3D 8/8 2 4/4 6X, 10X   (as documented in provider EOT note)   The patient was not available for call today. A voicemail was left.  The patient was encouraged to avoid sun exposure in the area of prior treatment for up to one year following radiation with either sunscreen or by the style of clothing worn in the sun.  The patient has scheduled follow up with her medical oncologist Dr. Burr Medico for ongoing surveillance, and was encouraged to call if she develops concerns or questions regarding radiation.     Leandra Kern, LPN

## 2022-05-30 ENCOUNTER — Other Ambulatory Visit: Payer: Self-pay | Admitting: Family Medicine

## 2022-05-30 DIAGNOSIS — F419 Anxiety disorder, unspecified: Secondary | ICD-10-CM

## 2022-05-30 DIAGNOSIS — F330 Major depressive disorder, recurrent, mild: Secondary | ICD-10-CM

## 2022-06-27 ENCOUNTER — Other Ambulatory Visit: Payer: Self-pay

## 2022-06-27 ENCOUNTER — Inpatient Hospital Stay: Payer: 59 | Attending: Hematology | Admitting: Hematology

## 2022-06-27 ENCOUNTER — Inpatient Hospital Stay: Payer: 59

## 2022-06-27 VITALS — BP 133/71 | HR 80 | Temp 98.2°F | Resp 18 | Wt 176.2 lb

## 2022-06-27 DIAGNOSIS — Z1379 Encounter for other screening for genetic and chromosomal anomalies: Secondary | ICD-10-CM

## 2022-06-27 DIAGNOSIS — F32A Depression, unspecified: Secondary | ICD-10-CM | POA: Diagnosis not present

## 2022-06-27 DIAGNOSIS — M2550 Pain in unspecified joint: Secondary | ICD-10-CM | POA: Insufficient documentation

## 2022-06-27 DIAGNOSIS — Z79810 Long term (current) use of selective estrogen receptor modulators (SERMs): Secondary | ICD-10-CM | POA: Diagnosis not present

## 2022-06-27 DIAGNOSIS — D0512 Intraductal carcinoma in situ of left breast: Secondary | ICD-10-CM | POA: Diagnosis present

## 2022-06-27 DIAGNOSIS — N6022 Fibroadenosis of left breast: Secondary | ICD-10-CM | POA: Diagnosis not present

## 2022-06-27 DIAGNOSIS — Z79890 Hormone replacement therapy (postmenopausal): Secondary | ICD-10-CM | POA: Insufficient documentation

## 2022-06-27 DIAGNOSIS — Z803 Family history of malignant neoplasm of breast: Secondary | ICD-10-CM | POA: Insufficient documentation

## 2022-06-27 DIAGNOSIS — E785 Hyperlipidemia, unspecified: Secondary | ICD-10-CM | POA: Insufficient documentation

## 2022-06-27 DIAGNOSIS — F419 Anxiety disorder, unspecified: Secondary | ICD-10-CM | POA: Diagnosis not present

## 2022-06-27 DIAGNOSIS — E039 Hypothyroidism, unspecified: Secondary | ICD-10-CM | POA: Insufficient documentation

## 2022-06-27 LAB — CBC WITH DIFFERENTIAL (CANCER CENTER ONLY)
Abs Immature Granulocytes: 0.01 10*3/uL (ref 0.00–0.07)
Basophils Absolute: 0 10*3/uL (ref 0.0–0.1)
Basophils Relative: 1 %
Eosinophils Absolute: 0.1 10*3/uL (ref 0.0–0.5)
Eosinophils Relative: 1 %
HCT: 35.7 % — ABNORMAL LOW (ref 36.0–46.0)
Hemoglobin: 12.4 g/dL (ref 12.0–15.0)
Immature Granulocytes: 0 %
Lymphocytes Relative: 21 %
Lymphs Abs: 0.9 10*3/uL (ref 0.7–4.0)
MCH: 31.8 pg (ref 26.0–34.0)
MCHC: 34.7 g/dL (ref 30.0–36.0)
MCV: 91.5 fL (ref 80.0–100.0)
Monocytes Absolute: 0.4 10*3/uL (ref 0.1–1.0)
Monocytes Relative: 8 %
Neutro Abs: 2.9 10*3/uL (ref 1.7–7.7)
Neutrophils Relative %: 69 %
Platelet Count: 192 10*3/uL (ref 150–400)
RBC: 3.9 MIL/uL (ref 3.87–5.11)
RDW: 11.9 % (ref 11.5–15.5)
WBC Count: 4.3 10*3/uL (ref 4.0–10.5)
nRBC: 0 % (ref 0.0–0.2)

## 2022-06-27 LAB — CMP (CANCER CENTER ONLY)
ALT: 31 U/L (ref 0–44)
AST: 21 U/L (ref 15–41)
Albumin: 4.2 g/dL (ref 3.5–5.0)
Alkaline Phosphatase: 45 U/L (ref 38–126)
Anion gap: 8 (ref 5–15)
BUN: 20 mg/dL (ref 6–20)
CO2: 28 mmol/L (ref 22–32)
Calcium: 9.1 mg/dL (ref 8.9–10.3)
Chloride: 104 mmol/L (ref 98–111)
Creatinine: 0.78 mg/dL (ref 0.44–1.00)
GFR, Estimated: >60 mL/min (ref >60)
Glucose, Bld: 170 mg/dL — ABNORMAL HIGH (ref 70–99)
Potassium: 3.7 mmol/L (ref 3.5–5.1)
Sodium: 140 mmol/L (ref 135–145)
Total Bilirubin: 0.4 mg/dL (ref 0.3–1.2)
Total Protein: 6.7 g/dL (ref 6.5–8.1)

## 2022-06-27 MED ORDER — TAMOXIFEN CITRATE 10 MG PO TABS: 10.0000 mg | ORAL_TABLET | ORAL | 3 refills | Status: DC

## 2022-06-27 NOTE — Assessment & Plan Note (Signed)
-  negative except VUS in CDKN2A

## 2022-06-27 NOTE — Progress Notes (Signed)
[facility]   Telephone:([phone] Fax:(336) [phone]   Clinic Follow up Note   Patient Care Team: Deeann Saint, MD as PCP - General (Family Medicine) Pershing Proud, RN as Oncology Nurse Navigator Donnelly Angelica, RN as Oncology Nurse Navigator Emelia Loron, MD as Consulting Physician (General Surgery) Malachy Mood, MD as Consulting Physician (Hematology) Dorothy Puffer, MD as Consulting Physician (Radiation Oncology) Pollyann Samples, NP as Nurse Practitioner (Nurse Practitioner)  Date of Service:  06/27/2022  CHIEF COMPLAINT: f/u of left breast DCIS    CURRENT THERAPY:  Tamoxifen, 10 mg,    ASSESSMENT:   Tara Yates is a 56 y.o. female with   Ductal carcinoma in situ of left breast -Discovered on screening mammography. Biopsy on 10/16/20 confirmed intermediate grade DCIS, ER/PR+. -Left lumpectomy on 12/18/20 again showed grade 2 DCIS with clear margins  -She declined adjuvant radiation. -she also initially declined antiestrogen therapy but agreed to start tamoxifen at survivorship visit 06/27/21. She transitioned from sertraline and began tamoxifen, and dose reduced to 5mg  daily due to tolerance issue --she has had two left breast biopsies, on 04/29/21 and 07/11/21, both showing ALH. She continues surveillance with alternating mammography and breast MRI.  -she underwent lumpectomy for left breast DCIS in 02/2022, margins clear  -she completed RT in 04/2022  Genetic testing -negative except VUS in CDKN2A     PLAN: -Lab Reviewed - Normal -I called in tamoxifen 10mg  every other day, it's hard for her to cut the pill, she is tolerating well overall, will continue for 2 more years  -lab ,f/u in 6 mths  SUMMARY OF ONCOLOGIC HISTORY: Oncology History  Ductal carcinoma in situ of left breast  10/16/2020 Pathology Results   Breast, left, needle core biopsy, UOQ posterior - DUCTAL CARCINOMA IN SITU WITH CALCIFICATIONS, INTERMEDIATE NUCLEAR  GRADE  PROGNOSTIC INDICATORS Results: IMMUNOHISTOCHEMICAL AND MORPHOMETRIC ANALYSIS PERFORMED MANUALLY Estrogen Receptor: 95%, POSITIVE, STRONG STAINING INTENSITY Progesterone Receptor: 20%, POSITIVE, STRONG STAINING INTENSITY   11/09/2020 Initial Diagnosis   Ductal carcinoma in situ of left breast   11/16/2020 Genetic Testing   Negative hereditary cancer genetic testing: no pathogenic variants detected in [facility] BRCAPl[facility] Panel.  The report date is Nov 16, 2020. The BRCAplus panel offered by [employer] and includes sequencing and deletion/duplication analysis for the following 8 genes: ATM, BRCA1, BRCA2, CDH1, CHEK2, PALB2, PTEN, and TP53.    Negative hereditary cancer genetic testing: no pathogenic variants detected in [employer] CustomNext-Cancer +RNAinsight Panel.  Variant of uncertain significance detected in CDKN2A at c.440C>T (p.A147V). The report date is Nov 26, 2020.   The CustomNext-Cancer+RNAinsight panel offered by [employer] includes sequencing and rearrangement analysis for the following 47 genes:  APC, ATM, AXIN2, BARD1, BMPR1A, BRCA1, BRCA2, BRIP1, CDH1, CDK4, CDKN2A, CHEK2, DICER1, EPCAM, GREM1, HOXB13, MEN1, MLH1, MSH2, MSH3, MSH6, MUTYH, NBN, NF1, NF2, NTHL1, PALB2, PMS2, POLD1, POLE, PTEN, RAD51C, RAD51D, RECQL, RET, SDHA, SDHAF2, SDHB, SDHC, SDHD, SMAD4, SMARCA4, STK11, TP53, TSC1, TSC2, and VHL.  RNA data is routinely analyzed for use in variant interpretation for all genes.    12/18/2020 Surgery   Left Lumpectomy  FINAL MICROSCOPIC DIAGNOSIS:   A. BREAST, LEFT, LUMPECTOMY:  - Ductal carcinoma in situ, intermediate grade, involving areas of  sclerosing adenosis, see comment  - Resection margins are negative for DCIS; closest is the posterior  margin at 3 mm  - Biopsy site changes  - Calcifications  - See oncology table   B. BREAST, LEFT ADDITIONAL LATERAL MARGIN,  EXCISION:  - Fibrocystic change with sclerosing adenosis and calcifications  - Negative for carcinoma    C. BREAST, LEFT ADDITIONAL POSTERIOR MARGIN, EXCISION:  - Benign breast parenchyma, negative for carcinoma    12/18/2020 Cancer Staging   Staging form: Breast, AJCC 8th Edition - Pathologic stage from 12/18/2020: Stage Unknown (pTis (DCIS), pNX, cM0, G2, ER+, PR+, HER2: Not Assessed) - Signed by Pollyann Samples, NP on 06/27/2021 Stage prefix: Initial diagnosis Histologic grading system: 3 grade system   06/27/2021 Survivorship   SCP delivered by Santiago Glad, NP   07/2021 -  Anti-estrogen oral therapy   Pending start of chemoprevention with tamoxifen in 07/2021    12/19/2021 Imaging   IMPRESSION: Breast MRI impression:  1. Area of non mass enhancement in the left breast has increased in size from the prior MRI. It lies just below cylinder shaped biopsy clip. Given the position of the post biopsy marker clip and the interval increase in size of this enhancement, re-biopsy is recommended. 2. No other areas of abnormal enhancement in the left breast. 3. No evidence of right breast malignancy.   RECOMMENDATION: 1. MRI guided core needle biopsy of the area of non mass enhancement in the left breast.   12/27/2021 Pathology Results   Diagnosis Breast, left, needle core biopsy, upper inner quadrant, cylinder clip - LOBULAR NEOPLASIA (ATYPICAL LOBULAR HYPERPLASIA). - FIBROCYSTIC CHANGES WITH USUAL DUCTAL HYPERPLASIA AND CALCIFICATIONS. - SEE NOTE.   02/06/2022 Surgery   Left breast lumpectomy by Dr. Harden Mo   02/06/2022 Pathology Results   FINAL MICROSCOPIC DIAGNOSIS: A. BREAST, LEFT, LUMPECTOMY: Ductal carcinoma in-situ, apocrine type with clear margins of resection. Radial sclerosing lesion with extensive atypical lobular hyperplasia in the immediate vicinity and elsewhere in the breast. Multiple foci of sclerosing adenosis. Invasive carcinoma is not identified.  Estrogen receptors and progesterone receptors both negative   03/18/2022 -  Radiation Therapy   Adjuvant radiation  by Dr. Mitzi Hansen, plan to complete 04/15/2022      INTERVAL HISTORY:  [name] is here for a follow up of left breast DCIS  She was last seen by me on 09/23/2021 She presents to the clinic accompanied by her husband. Pt states she has been having Joint pain , since taking Tamoxifen.    All other systems were reviewed with the patient and are negative.  MEDICAL HISTORY:  Past Medical History:  Diagnosis Date   Allergy    Anxiety    Back pain    Breast cancer (HCC)    Constipation    Depression    Ductal carcinoma in situ of left breast 11/09/2020   Family history of breast cancer 11/09/2020   HSV (herpes simplex virus) anogenital infection    Hyperlipidemia    Hypothyroidism    Joint pain    Plantar fasciitis, bilateral    SOBOE (shortness of breath on exertion)     SURGICAL HISTORY: Past Surgical History:  Procedure Laterality Date   BREAST BIOPSY Right 02/28/2015   BREAST BIOPSY Left 07/11/2021   BREAST EXCISIONAL BIOPSY Right 03/29/2015   had seed placement as wel   BREAST LUMPECTOMY WITH RADIOACTIVE SEED LOCALIZATION Left 12/18/2020   Procedure: LEFT BREAST LUMPECTOMY WITH RADIOACTIVE SEED LOCALIZATION;  Surgeon: Emelia Loron, MD;  Location: [facility];  Service: General;  Laterality: Left;   CARPAL TUNNEL RELEASE Right 2022   ENDOMETRIAL ABLATION     MOUTH SURGERY     RADIOACTIVE SEED GUIDED EXCISIONAL BREAST BIOPSY Right 04/03/2015  Procedure: RADIOACTIVE SEED GUIDED EXCISIONAL BREAST BIOPSY;  Surgeon: Emelia Loron, MD;  Location: [facility];  Service: General;  Laterality: Right;   RADIOACTIVE SEED GUIDED EXCISIONAL BREAST BIOPSY Left 02/06/2022   Procedure: LEFT BREAST SEED GUIDED EXCISIONAL BIOPSY;  Surgeon: Emelia Loron, MD;  Location: [facility];  Service: General;  Laterality: Left;  LMA    I have reviewed the social history and family history with the patient and they are unchanged from previous  note.  ALLERGIES:  has No Known Allergies.  MEDICATIONS:  Current Outpatient Medications  Medication Sig Dispense Refill   levothyroxine (SYNTHROID) 25 MCG tablet TAKE 1 TABLET(25 MCG) BY MOUTH DAILY BEFORE BREAKFAST 90 tablet 1   tamoxifen (NOLVADEX) 10 MG tablet Take 0.5 tablets (5 mg total) by mouth daily. 30 tablet 3   No current facility-administered medications for this visit.    PHYSICAL EXAMINATION: ECOG PERFORMANCE STATUS: 0 - Asymptomatic  Vitals:   06/27/22 1357  BP: 133/71  Pulse: 80  Resp: 18  Temp: 98.2 F (36.8 C)  SpO2: 98%   Wt Readings from Last 3 Encounters:  06/27/22 176 lb 3.2 oz (79.9 kg)  04/01/22 170 lb 4.8 oz (77.2 kg)  03/06/22 171 lb 9.6 oz (77.8 kg)     GENERAL:alert, no distress and comfortable SKIN: skin color, texture, turgor are normal, no rashes or significant lesions EYES: normal, Conjunctiva are pink and non-injected, sclera clear NECK: (-) supple, thyroid normal size, non-tender, without nodularity LYMPH: (-)  no palpable lymphadenopathy in the cervical, axillary  HEART: regular rate & rhythm and no murmurs and no lower extremity edema Musculoskeletal:no cyanosis of digits and no clubbing  NEURO: alert & oriented x 3 with fluent speech, no focal motor/sensory deficits BREAST:(-) Left breast a little dark from radiation Little tightness when raising the left arm due to scar tissue.No papable ,mass. Breast exam benign.     LABORATORY DATA:  I have reviewed the data as listed    Latest Ref Rng & Units 06/27/2022    1:37 PM 04/01/2022    9:15 AM 01/31/2022    2:58 PM  CBC  WBC 4.0 - 10.5 K/uL 4.3  4.1  5.2   Hemoglobin 12.0 - 15.0 g/dL 82.9  56.2  13.0   Hematocrit 36.0 - 46.0 % 35.7  37.6  39.4   Platelets 150 - 400 K/uL 192  200  222         Latest Ref Rng & Units 06/27/2022    1:37 PM 04/01/2022    9:15 AM 01/31/2022    2:58 PM  CMP  Glucose 70 - 99 mg/dL 865  784  696   BUN 6 - 20 mg/dL 20  16  14    Creatinine 0.44 -  1.00 mg/dL 2.95  2.84  1.32   Sodium 135 - 145 mmol/L 140  137  142   Potassium 3.5 - 5.1 mmol/L 3.7  4.2  4.2   Chloride 98 - 111 mmol/L 104  103  106   CO2 22 - 32 mmol/L 28  27  27    Calcium 8.9 - 10.3 mg/dL 9.1  8.8  9.2   Total Protein 6.5 - 8.1 g/dL 6.7  7.0    Total Bilirubin 0.3 - 1.2 mg/dL 0.4  0.5    Alkaline Phos 38 - 126 U/L 45  45    AST 15 - 41 U/L 21  16    ALT 0 - 44 U/L 31  19  RADIOGRAPHIC STUDIES: I have personally reviewed the radiological images as listed and agreed with the findings in the report. No results found.    No orders of the defined types were placed in this encounter.  All questions were answered. The patient knows to call the clinic with any problems, questions or concerns. No barriers to learning was detected. The total time spent in the appointment was 30 minutes.     Malachy Mood, MD 06/27/2022   Carolin Coy, CMA, am acting as scribe for Malachy Mood, MD.   I have reviewed the above documentation for accuracy and completeness, and I agree with the above.

## 2022-06-27 NOTE — Assessment & Plan Note (Addendum)
-  Discovered on screening mammography. Biopsy on 10/16/20 confirmed intermediate grade DCIS, ER/PR+. -Left lumpectomy on 12/18/20 again showed grade 2 DCIS with clear margins  -She declined adjuvant radiation. -she also initially declined antiestrogen therapy but agreed to start tamoxifen at survivorship visit 06/27/21. She transitioned from sertraline and began tamoxifen, and dose reduced to '5mg'$  daily due to tolerance issue --she has had two left breast biopsies, on 04/29/21 and 07/11/21, both showing ALH. She continues surveillance with alternating mammography and breast MRI.  -she underwent lumpectomy for left breast DCIS in 02/2022, margins clear  -she completed RT in 04/2022

## 2022-06-28 ENCOUNTER — Encounter: Payer: Self-pay | Admitting: Hematology

## 2022-07-17 ENCOUNTER — Telehealth (INDEPENDENT_AMBULATORY_CARE_PROVIDER_SITE_OTHER): Payer: 59 | Admitting: Family Medicine

## 2022-07-17 ENCOUNTER — Encounter: Payer: Self-pay | Admitting: Family Medicine

## 2022-07-17 VITALS — Wt 169.0 lb

## 2022-07-17 DIAGNOSIS — U071 COVID-19: Secondary | ICD-10-CM | POA: Diagnosis not present

## 2022-07-17 DIAGNOSIS — R051 Acute cough: Secondary | ICD-10-CM | POA: Diagnosis not present

## 2022-07-17 MED ORDER — MOLNUPIRAVIR EUA 200MG CAPSULE: 4.0000 | ORAL_CAPSULE | Freq: Two times a day (BID) | ORAL | 0 refills | Status: AC

## 2022-07-17 MED ORDER — HYDROCODONE BIT-HOMATROP MBR 5-1.5 MG/5ML PO SOLN: 5.0000 mL | Freq: Every evening | ORAL | 0 refills | Status: DC | PRN

## 2022-07-17 MED ORDER — BENZONATATE 100 MG PO CAPS: 100.0000 mg | ORAL_CAPSULE | Freq: Two times a day (BID) | ORAL | 0 refills | Status: DC | PRN

## 2022-07-17 NOTE — Progress Notes (Signed)
Virtual Visit via Video Note  I connected with Tara Yates on 07/17/22 at  4:45 PM EST by a video enabled telemedicine application 2/2 [id number] pandemic and verified that I am speaking with the correct person using two identifiers.  Location patient: home Location provider:work or home office Persons participating in the virtual visit: patient, provider.  Pt's daughter in rm.  I discussed the limitations of evaluation and management by telemedicine and the availability of in person appointments. The patient expressed understanding and agreed to proceed.  Chief Complaint  Patient presents with   Covid Positive    Pt reports she got tested positive for covid yesterday. Sx started with sore throat on Monday. Developed dry cough, congestion, headache, chills, fever, bodyache. No more Sore throat, chills and fever. Taking NyQuil/ DayQuil and cough drop.     HPI: Patient is a 57 year old female with pmh sig for history of breast cancer, HLD, hypothyroidism, anxiety, depression who is seen for acute concern.  Pt started feeling bad on Monday with sore throat.  By Tuesday pt really started feeling bad.  Wednesday felt achy with subj fever, congestion, HA, chills.  At home COVID test positive yesterday 07/16/2022.  Feeing better today but tired with continued dry cough.    Pt had several COVID-19 vaccines.   ROS: See pertinent positives and negatives per HPI.  Past Medical History:  Diagnosis Date   Allergy    Anxiety    Back pain    Breast cancer ([place])    Constipation    Depression    Ductal carcinoma in situ of left breast 11/09/2020   Family history of breast cancer 11/09/2020   HSV (herpes simplex virus) anogenital infection    Hyperlipidemia    Hypothyroidism    Joint pain    Plantar fasciitis, bilateral    SOBOE (shortness of breath on exertion)     Past Surgical History:  Procedure Laterality Date   BREAST BIOPSY Right 02/28/2015   BREAST BIOPSY Left 07/11/2021   BREAST  EXCISIONAL BIOPSY Right 03/29/2015   had seed placement as wel   BREAST LUMPECTOMY WITH RADIOACTIVE SEED LOCALIZATION Left 12/18/2020   Procedure: LEFT BREAST LUMPECTOMY WITH RADIOACTIVE SEED LOCALIZATION;  Surgeon: Rolm Bookbinder, MD;  Location: [place];  Service: General;  Laterality: Left;   CARPAL TUNNEL RELEASE Right 2022   ENDOMETRIAL ABLATION     MOUTH SURGERY     RADIOACTIVE SEED GUIDED EXCISIONAL BREAST BIOPSY Right 04/03/2015   Procedure: RADIOACTIVE SEED GUIDED EXCISIONAL BREAST BIOPSY;  Surgeon: Rolm Bookbinder, MD;  Location: [place];  Service: General;  Laterality: Right;   RADIOACTIVE SEED GUIDED EXCISIONAL BREAST BIOPSY Left 02/06/2022   Procedure: LEFT BREAST SEED GUIDED EXCISIONAL BIOPSY;  Surgeon: Rolm Bookbinder, MD;  Location: [place];  Service: General;  Laterality: Left;  LMA    Family History  Problem Relation Age of Onset   Heart disease Mother    Heart failure Mother    Heart attack Mother    Hyperlipidemia Mother    Sudden death Mother    Colon polyps Father    Heart disease Father    Hyperlipidemia Father    Hypertension Father    Thyroid disease Father    Sleep apnea Father    Obesity Father    Breast cancer Paternal Grandmother        dx 15s, d. 30   Breast cancer Other        PGM's sisters, x2, dx unknown age  Colon cancer Neg Hx    Esophageal cancer Neg Hx    Rectal cancer Neg Hx    Stomach cancer Neg Hx      Current Outpatient Medications:    Calcium Carb-Cholecalciferol (CALCIUM 600 + D PO), Take by mouth., Disp: , Rfl:    levothyroxine (SYNTHROID) 25 MCG tablet, TAKE 1 TABLET(25 MCG) BY MOUTH DAILY BEFORE BREAKFAST, Disp: 90 tablet, Rfl: 1   tamoxifen (NOLVADEX) 10 MG tablet, Take 1 tablet (10 mg total) by mouth every other day., Disp: 30 tablet, Rfl: 3  EXAM:  VITALS per patient if applicable: RR between 96-04 bpm  GENERAL: alert, oriented, appears well and in no acute distress  HEENT:  atraumatic, conjunctiva clear, no obvious abnormalities on inspection of external nose and ears  NECK: normal movements of the head and neck  LUNGS: on inspection no signs of respiratory distress, breathing rate appears normal, no obvious gross SOB, gasping or wheezing  CV: no obvious cyanosis  MS: moves all visible extremities without noticeable abnormality  PSYCH/NEURO: pleasant and cooperative, no obvious depression or anxiety, speech and thought processing grossly intact  ASSESSMENT AND PLAN:  Discussed the following assessment and plan:  COVID-19 virus infection -Symptoms starting 07/14/2022 with positive COVID test at 07/16/2022 -Discussed r/b/a of antiviral medications.  Patient will start molnupiravir -Continue supportive care with OTC cough/cold meds -Given strict precautions  - Plan: molnupiravir EUA (LAGEVRIO) 200 mg CAPS capsule, HYDROcodone bit-homatropine (HYCODAN) 5-1.5 MG/5ML syrup, benzonatate (TESSALON) 100 MG capsule  Cough -Plan: HYDRO-codone bit-homatropine (HYCODAN) 5/1.5 MG/5ML syrup  F/u prn   I discussed the assessment and treatment plan with the patient. The patient was provided an opportunity to ask questions and all were answered. The patient agreed with the plan and demonstrated an understanding of the instructions.   The patient was advised to call back or seek an in-person evaluation if the symptoms worsen or if the condition fails to improve as anticipated.  Billie Ruddy, MD

## 2022-08-06 ENCOUNTER — Other Ambulatory Visit: Payer: Self-pay | Admitting: Family Medicine

## 2022-08-06 DIAGNOSIS — N6489 Other specified disorders of breast: Secondary | ICD-10-CM

## 2022-08-21 ENCOUNTER — Ambulatory Visit (INDEPENDENT_AMBULATORY_CARE_PROVIDER_SITE_OTHER): Payer: 59 | Admitting: Family Medicine

## 2022-08-21 ENCOUNTER — Encounter: Payer: Self-pay | Admitting: Family Medicine

## 2022-08-21 VITALS — BP 120/70 | HR 62 | Temp 98.5°F | Ht 64.96 in | Wt 167.4 lb

## 2022-08-21 DIAGNOSIS — E782 Mixed hyperlipidemia: Secondary | ICD-10-CM | POA: Diagnosis not present

## 2022-08-21 DIAGNOSIS — E039 Hypothyroidism, unspecified: Secondary | ICD-10-CM | POA: Diagnosis not present

## 2022-08-21 DIAGNOSIS — R058 Other specified cough: Secondary | ICD-10-CM

## 2022-08-21 DIAGNOSIS — D0512 Intraductal carcinoma in situ of left breast: Secondary | ICD-10-CM | POA: Diagnosis not present

## 2022-08-21 DIAGNOSIS — Z0000 Encounter for general adult medical examination without abnormal findings: Secondary | ICD-10-CM | POA: Diagnosis not present

## 2022-08-21 DIAGNOSIS — E559 Vitamin D deficiency, unspecified: Secondary | ICD-10-CM

## 2022-08-21 LAB — COMPREHENSIVE METABOLIC PANEL
ALT: 23 U/L (ref 0–35)
AST: 19 U/L (ref 0–37)
Albumin: 4.4 g/dL (ref 3.5–5.2)
Alkaline Phosphatase: 43 U/L (ref 39–117)
BUN: 15 mg/dL (ref 6–23)
CO2: 27 mEq/L (ref 19–32)
Calcium: 9.3 mg/dL (ref 8.4–10.5)
Chloride: 104 mEq/L (ref 96–112)
Creatinine, Ser: 0.75 mg/dL (ref 0.40–1.20)
GFR: 90.4 mL/min (ref >60.00)
Glucose, Bld: 97 mg/dL (ref 70–99)
Potassium: 5 mEq/L (ref 3.5–5.1)
Sodium: 141 mEq/L (ref 135–145)
Total Bilirubin: 0.5 mg/dL (ref 0.2–1.2)
Total Protein: 7.1 g/dL (ref 6.0–8.3)

## 2022-08-21 LAB — CBC WITH DIFFERENTIAL/PLATELET
Basophils Absolute: 0 10*3/uL (ref 0.0–0.1)
Basophils Relative: 1.1 % (ref 0.0–3.0)
Eosinophils Absolute: 0.1 10*3/uL (ref 0.0–0.7)
Eosinophils Relative: 2.2 % (ref 0.0–5.0)
HCT: 40.3 % (ref 36.0–46.0)
Hemoglobin: 13.8 g/dL (ref 12.0–15.0)
Lymphocytes Relative: 25.8 % (ref 12.0–46.0)
Lymphs Abs: 0.9 10*3/uL (ref 0.7–4.0)
MCHC: 34.3 g/dL (ref 30.0–36.0)
MCV: 94.6 fl (ref 78.0–100.0)
Monocytes Absolute: 0.4 10*3/uL (ref 0.1–1.0)
Monocytes Relative: 11.8 % (ref 3.0–12.0)
Neutro Abs: 2.2 10*3/uL (ref 1.4–7.7)
Neutrophils Relative %: 59.1 % (ref 43.0–77.0)
Platelets: 222 10*3/uL (ref 150.0–400.0)
RBC: 4.26 Mil/uL (ref 3.87–5.11)
RDW: 12.7 % (ref 11.5–15.5)
WBC: 3.6 10*3/uL — ABNORMAL LOW (ref 4.0–10.5)

## 2022-08-21 LAB — LIPID PANEL
Cholesterol: 190 mg/dL (ref 0–200)
HDL: 54.7 mg/dL (ref >39.00)
LDL Cholesterol: 115 mg/dL — ABNORMAL HIGH (ref 0–99)
NonHDL: 134.81
Total CHOL/HDL Ratio: 3
Triglycerides: 101 mg/dL (ref 0.0–149.0)
VLDL: 20.2 mg/dL (ref 0.0–40.0)

## 2022-08-21 LAB — VITAMIN D 25 HYDROXY (VIT D DEFICIENCY, FRACTURES): VITD: 28.27 ng/mL — ABNORMAL LOW (ref 30.00–100.00)

## 2022-08-21 LAB — TSH: TSH: 3.47 u[IU]/mL (ref 0.35–5.50)

## 2022-08-21 LAB — HEMOGLOBIN A1C: Hgb A1c MFr Bld: 5 % (ref 4.6–6.5)

## 2022-08-21 NOTE — Progress Notes (Signed)
Established Patient Office Visit   Subjective  Patient ID: Jaydeen Tocco, female    DOB: 1966/03/01  Age: 57 y.o. MRN: [id number]  Chief Complaint  Patient presents with   Annual Exam    Patient is a 57 year old with pmh sig for DCIS left breast on tamoxifen, hypothyroidism,h/o vitamin D deficiency, h/o GAD, HLD who is seen for CPE.  Patient states doing well overall.  Endorses occasional cough s/p COVID-19 in early January.  Patient recently moved into a new home and is getting settled.  Patient has mammogram scheduled 09/23/2022.  Colonoscopy and Pap up-to-date.      ROS Negative unless stated above    Objective:     BP 120/70 (BP Location: Left Arm, Patient Position: Sitting, Cuff Size: Normal)   Pulse 62   Temp 98.5 F (36.9 C) (Oral)   Ht 5' 4.96" (1.65 m)   Wt 167 lb 6.4 oz (75.9 kg)   LMP 07/07/2018   SpO2 97%   BMI 27.89 kg/m    Physical Exam Constitutional:      Appearance: Normal appearance.  HENT:     Head: Normocephalic and atraumatic.     Right Ear: Tympanic membrane, ear canal and external ear normal.     Left Ear: Tympanic membrane, ear canal and external ear normal.     Nose: Nose normal.     Mouth/Throat:     Mouth: Mucous membranes are moist.     Pharynx: No oropharyngeal exudate or posterior oropharyngeal erythema.  Eyes:     General: No scleral icterus.    Extraocular Movements: Extraocular movements intact.     Conjunctiva/sclera: Conjunctivae normal.     Pupils: Pupils are equal, round, and reactive to light.  Neck:     Thyroid: No thyromegaly.  Cardiovascular:     Rate and Rhythm: Normal rate and regular rhythm.     Pulses: Normal pulses.     Heart sounds: Normal heart sounds. No murmur heard.    No friction rub.  Pulmonary:     Effort: Pulmonary effort is normal.     Breath sounds: Normal breath sounds. No wheezing, rhonchi or rales.  Abdominal:     General: Bowel sounds are normal.     Palpations: Abdomen is soft.      Tenderness: There is no abdominal tenderness.  Musculoskeletal:        General: No deformity. Normal range of motion.  Lymphadenopathy:     Cervical: No cervical adenopathy.  Skin:    General: Skin is warm and dry.     Findings: No lesion.  Neurological:     General: No focal deficit present.     Mental Status: She is alert and oriented to person, place, and time.  Psychiatric:        Mood and Affect: Mood normal.        Thought Content: Thought content normal.     No results found for any visits on 08/21/22.    Assessment & Plan:  Well adult exam -Anticipatory guidance given including wearing seatbelts, smoke detectors in the home, increasing physical activity, increasing p.o. intake of water and vegetables. -Labs -Mammogram scheduled for 09/23/2022 -Colonoscopy up-to-date, done 4//2023. -Pap up-to-date done 09/10/2020.  Repeat in 2027 -Immunizations reviewed. -Next CPE in 1 year -     Comprehensive metabolic panel -     CBC with Differential/Platelet -     TSH -     Hemoglobin A1c -     Lipid panel  Mixed hyperlipidemia -Total cholesterol 203, HDL 55.6, LDL 115, triglycerides 159 on 08/19/2021 -Continue lifestyle modifications -     Lipid panel  Acquired hypothyroidism -Stable -TSH 2.27 on 08/19/2021 -Continue Synthroid 25 mcg daily.  Adjust dose based on lab results  if needed -     TSH  Ductal carcinoma in situ of left breast -Continue tamoxifen 10 mg qOD -Continue follow-up with oncology -     Comprehensive metabolic panel -     CBC with Differential/Platelet -     VITAMIN D 25 Hydroxy (Vit-D Deficiency, Fractures)  Other cough -Continued s/p COVID-19 infection in mid January 2024 -Consider using OTC antihistamine -Also discussed using saline nasal rinse for nasal gel such as [place] as dry heat in home may be contributing  Vitamin D deficiency -     VITAMIN D 25 Hydroxy (Vit-D Deficiency, Fractures)   Return if symptoms worsen or fail to improve.    Billie Ruddy, MD

## 2022-08-22 ENCOUNTER — Encounter: Payer: Self-pay | Admitting: Family Medicine

## 2022-08-22 ENCOUNTER — Other Ambulatory Visit: Payer: Self-pay | Admitting: Family Medicine

## 2022-08-22 DIAGNOSIS — E559 Vitamin D deficiency, unspecified: Secondary | ICD-10-CM

## 2022-08-22 MED ORDER — VITAMIN D (ERGOCALCIFEROL) 1.25 MG (50000 UNIT) PO CAPS: 50000.0000 [IU] | ORAL_CAPSULE | ORAL | 0 refills | Status: DC

## 2022-09-20 ENCOUNTER — Other Ambulatory Visit: Payer: Self-pay | Admitting: Family Medicine

## 2022-09-20 DIAGNOSIS — E039 Hypothyroidism, unspecified: Secondary | ICD-10-CM

## 2022-09-23 ENCOUNTER — Ambulatory Visit
Admission: RE | Admit: 2022-09-23 | Discharge: 2022-09-23 | Disposition: A | Discharge status: Home / Self Care | Payer: 59 | Source: Ambulatory Visit | Attending: Family Medicine | Admitting: Family Medicine

## 2022-09-23 DIAGNOSIS — N6489 Other specified disorders of breast: Secondary | ICD-10-CM

## 2022-12-29 ENCOUNTER — Ambulatory Visit: Payer: 59 | Admitting: Nurse Practitioner

## 2022-12-29 ENCOUNTER — Other Ambulatory Visit: Payer: 59

## 2023-01-15 ENCOUNTER — Other Ambulatory Visit: Payer: Self-pay

## 2023-01-15 ENCOUNTER — Inpatient Hospital Stay (HOSPITAL_BASED_OUTPATIENT_CLINIC_OR_DEPARTMENT_OTHER): Payer: 59 | Admitting: Nurse Practitioner

## 2023-01-15 ENCOUNTER — Inpatient Hospital Stay: Payer: 59 | Attending: Nurse Practitioner

## 2023-01-15 ENCOUNTER — Encounter: Payer: Self-pay | Admitting: Nurse Practitioner

## 2023-01-15 VITALS — BP 132/74 | HR 70 | Temp 97.9°F | Resp 20 | Wt 176.1 lb

## 2023-01-15 DIAGNOSIS — Z79899 Other long term (current) drug therapy: Secondary | ICD-10-CM | POA: Insufficient documentation

## 2023-01-15 DIAGNOSIS — N6092 Unspecified benign mammary dysplasia of left breast: Secondary | ICD-10-CM

## 2023-01-15 DIAGNOSIS — R5383 Other fatigue: Secondary | ICD-10-CM | POA: Diagnosis not present

## 2023-01-15 DIAGNOSIS — Z79810 Long term (current) use of selective estrogen receptor modulators (SERMs): Secondary | ICD-10-CM | POA: Diagnosis not present

## 2023-01-15 DIAGNOSIS — R232 Flushing: Secondary | ICD-10-CM | POA: Insufficient documentation

## 2023-01-15 DIAGNOSIS — Z79890 Hormone replacement therapy (postmenopausal): Secondary | ICD-10-CM | POA: Insufficient documentation

## 2023-01-15 DIAGNOSIS — E039 Hypothyroidism, unspecified: Secondary | ICD-10-CM | POA: Insufficient documentation

## 2023-01-15 DIAGNOSIS — Z923 Personal history of irradiation: Secondary | ICD-10-CM | POA: Diagnosis not present

## 2023-01-15 DIAGNOSIS — N6022 Fibroadenosis of left breast: Secondary | ICD-10-CM | POA: Diagnosis not present

## 2023-01-15 DIAGNOSIS — D0512 Intraductal carcinoma in situ of left breast: Secondary | ICD-10-CM

## 2023-01-15 DIAGNOSIS — E785 Hyperlipidemia, unspecified: Secondary | ICD-10-CM | POA: Diagnosis not present

## 2023-01-15 LAB — CBC WITH DIFFERENTIAL (CANCER CENTER ONLY)
Abs Immature Granulocytes: 0.01 10*3/uL (ref 0.00–0.07)
Basophils Absolute: 0 10*3/uL (ref 0.0–0.1)
Basophils Relative: 1 %
Eosinophils Absolute: 0.1 10*3/uL (ref 0.0–0.5)
Eosinophils Relative: 2 %
HCT: 38.6 % (ref 36.0–46.0)
Hemoglobin: 13.4 g/dL (ref 12.0–15.0)
Immature Granulocytes: 0 %
Lymphocytes Relative: 28 %
Lymphs Abs: 1 10*3/uL (ref 0.7–4.0)
MCH: 32.1 pg (ref 26.0–34.0)
MCHC: 34.7 g/dL (ref 30.0–36.0)
MCV: 92.3 fL (ref 80.0–100.0)
Monocytes Absolute: 0.4 10*3/uL (ref 0.1–1.0)
Monocytes Relative: 10 %
Neutro Abs: 2.2 10*3/uL (ref 1.7–7.7)
Neutrophils Relative %: 59 %
Platelet Count: 195 10*3/uL (ref 150–400)
RBC: 4.18 MIL/uL (ref 3.87–5.11)
RDW: 11.8 % (ref 11.5–15.5)
WBC Count: 3.7 10*3/uL — ABNORMAL LOW (ref 4.0–10.5)
nRBC: 0 % (ref 0.0–0.2)

## 2023-01-15 LAB — CMP (CANCER CENTER ONLY)
ALT: 34 U/L (ref 0–44)
AST: 22 U/L (ref 15–41)
Albumin: 4.1 g/dL (ref 3.5–5.0)
Alkaline Phosphatase: 46 U/L (ref 38–126)
Anion gap: 9 (ref 5–15)
BUN: 14 mg/dL (ref 6–20)
CO2: 26 mmol/L (ref 22–32)
Calcium: 9.1 mg/dL (ref 8.9–10.3)
Chloride: 105 mmol/L (ref 98–111)
Creatinine: 0.72 mg/dL (ref 0.44–1.00)
GFR, Estimated: >60 mL/min (ref >60)
Glucose, Bld: 90 mg/dL (ref 70–99)
Potassium: 4.1 mmol/L (ref 3.5–5.1)
Sodium: 140 mmol/L (ref 135–145)
Total Bilirubin: 0.4 mg/dL (ref 0.3–1.2)
Total Protein: 7 g/dL (ref 6.5–8.1)

## 2023-01-15 NOTE — Progress Notes (Signed)
Patient Care Team: Deeann Saint, MD as PCP - General (Family Medicine) Pershing Proud, RN as Oncology Nurse Navigator Donnelly Angelica, RN as Oncology Nurse Navigator Emelia Loron, MD as Consulting Physician (General Surgery) Malachy Mood, MD as Consulting Physician (Hematology) Dorothy Puffer, MD as Consulting Physician (Radiation Oncology) Pollyann Samples, NP as Nurse Practitioner (Nurse Practitioner)   CHIEF COMPLAINT: Follow up left breast DCIS  Oncology History  Ductal carcinoma in situ of left breast  10/16/2020 Pathology Results   Breast, left, needle core biopsy, UOQ posterior - DUCTAL CARCINOMA IN SITU WITH CALCIFICATIONS, INTERMEDIATE NUCLEAR GRADE  PROGNOSTIC INDICATORS Results: IMMUNOHISTOCHEMICAL AND MORPHOMETRIC ANALYSIS PERFORMED MANUALLY Estrogen Receptor: 95%, POSITIVE, STRONG STAINING INTENSITY Progesterone Receptor: 20%, POSITIVE, STRONG STAINING INTENSITY   11/09/2020 Initial Diagnosis   Ductal carcinoma in situ of left breast   11/16/2020 Genetic Testing   Negative hereditary cancer genetic testing: no pathogenic variants detected in [facility] Panel.  The report date is Nov 16, 2020. The BRCAplus panel offered by [employer] and includes sequencing and deletion/duplication analysis for the following 8 genes: ATM, BRCA1, BRCA2, CDH1, CHEK2, PALB2, PTEN, and TP53.    Negative hereditary cancer genetic testing: no pathogenic variants detected in [facility] CustomNext-Cancer +RNAinsight Panel.  Variant of uncertain significance detected in CDKN2A at c.440C>T (p.A147V). The report date is Nov 26, 2020.   The CustomNext-Cancer+RNAinsight panel offered by [employer] [facility] includes sequencing and rearrangement analysis for the following 47 genes:  APC, ATM, AXIN2, BARD1, BMPR1A, BRCA1, BRCA2, BRIP1, CDH1, CDK4, CDKN2A, CHEK2, DICER1, EPCAM, GREM1, HOXB13, MEN1, MLH1, MSH2, MSH3, MSH6, MUTYH, NBN, NF1, NF2, NTHL1, PALB2, PMS2, POLD1, POLE, PTEN, RAD51C, RAD51D,  RECQL, RET, SDHA, SDHAF2, SDHB, SDHC, SDHD, SMAD4, SMARCA4, STK11, TP53, TSC1, TSC2, and VHL.  RNA data is routinely analyzed for use in variant interpretation for all genes.    12/18/2020 Surgery   Left Lumpectomy  FINAL MICROSCOPIC DIAGNOSIS:   A. BREAST, LEFT, LUMPECTOMY:  - Ductal carcinoma in situ, intermediate grade, involving areas of  sclerosing adenosis, see comment  - Resection margins are negative for DCIS; closest is the posterior  margin at 3 mm  - Biopsy site changes  - Calcifications  - See oncology table   B. BREAST, LEFT ADDITIONAL LATERAL MARGIN, EXCISION:  - Fibrocystic change with sclerosing adenosis and calcifications  - Negative for carcinoma   C. BREAST, LEFT ADDITIONAL POSTERIOR MARGIN, EXCISION:  - Benign breast parenchyma, negative for carcinoma    12/18/2020 Cancer Staging   Staging form: Breast, AJCC 8th Edition - Pathologic stage from 12/18/2020: Stage Unknown (pTis (DCIS), pNX, cM0, G2, ER+, PR+, HER2: Not Assessed) - Signed by Pollyann Samples, NP on 06/27/2021 Stage prefix: Initial diagnosis Histologic grading system: 3 grade system   06/27/2021 Survivorship   SCP delivered by Tara Glad, NP   07/2021 -  Anti-estrogen oral therapy   Pending start of chemoprevention with tamoxifen in 07/2021    12/19/2021 Imaging   IMPRESSION: Breast MRI impression:  1. Area of non mass enhancement in the left breast has increased in size from the prior MRI. It lies just below cylinder shaped biopsy clip. Given the position of the post biopsy marker clip and the interval increase in size of this enhancement, re-biopsy is recommended. 2. No other areas of abnormal enhancement in the left breast. 3. No evidence of right breast malignancy.   RECOMMENDATION: 1. MRI guided core needle biopsy of the area of non mass enhancement in the left breast.  12/27/2021 Pathology Results   Diagnosis Breast, left, needle core biopsy, upper inner quadrant, cylinder clip -  LOBULAR NEOPLASIA (ATYPICAL LOBULAR HYPERPLASIA). - FIBROCYSTIC CHANGES WITH USUAL DUCTAL HYPERPLASIA AND CALCIFICATIONS. - SEE NOTE.   02/06/2022 Surgery   Left breast lumpectomy by Dr. Harden Mo   02/06/2022 Pathology Results   FINAL MICROSCOPIC DIAGNOSIS: A. BREAST, LEFT, LUMPECTOMY: Ductal carcinoma in-situ, apocrine type with clear margins of resection. Radial sclerosing lesion with extensive atypical lobular hyperplasia in the immediate vicinity and elsewhere in the breast. Multiple foci of sclerosing adenosis. Invasive carcinoma is not identified.  Estrogen receptors and progesterone receptors both negative   03/18/2022 -  Radiation Therapy   Adjuvant radiation by Dr. Mitzi Hansen, plan to complete 04/15/2022      CURRENT THERAPY: Tamoxifen 10 mg daily; takes 10 mg every other day due to tolerance, starting 06/2021  INTERVAL HISTORY Tara Yates returns for follow up as scheduled. Last seen by Dr. Mosetta Putt 06/27/22. Mammogram 09/23/22 was negative, breast density cat d.  She takes tamoxifen 10 mg every other day, tolerating very well on the schedule.  She still battles weight gain and brittle hair, but exercising and managing with specific products. Just got married in March.  Denies breast concerns such as new lump/mass, nipple discharge or inversion, or skin change.  ROS  All other systems reviewed and negative  Past Medical History:  Diagnosis Date   Allergy    Anxiety    Back pain    Breast cancer (HCC)    Constipation    Depression    Ductal carcinoma in situ of left breast 11/09/2020   Family history of breast cancer 11/09/2020   HSV (herpes simplex virus) anogenital infection    Hyperlipidemia    Hypothyroidism    Joint pain    Plantar fasciitis, bilateral    SOBOE (shortness of breath on exertion)      Past Surgical History:  Procedure Laterality Date   BREAST BIOPSY Right 02/28/2015   BREAST BIOPSY Left 07/11/2021   BREAST EXCISIONAL BIOPSY Right 03/29/2015    had seed placement as wel   BREAST LUMPECTOMY WITH RADIOACTIVE SEED LOCALIZATION Left 12/18/2020   Procedure: LEFT BREAST LUMPECTOMY WITH RADIOACTIVE SEED LOCALIZATION;  Surgeon: Emelia Loron, MD;  Location: [facility];  Service: General;  Laterality: Left;   CARPAL TUNNEL RELEASE Right 2022   ENDOMETRIAL ABLATION     MOUTH SURGERY     RADIOACTIVE SEED GUIDED EXCISIONAL BREAST BIOPSY Right 04/03/2015   Procedure: RADIOACTIVE SEED GUIDED EXCISIONAL BREAST BIOPSY;  Surgeon: Emelia Loron, MD;  Location:  SURGERY CENTER;  Service: General;  Laterality: Right;   RADIOACTIVE SEED GUIDED EXCISIONAL BREAST BIOPSY Left 02/06/2022   Procedure: LEFT BREAST SEED GUIDED EXCISIONAL BIOPSY;  Surgeon: Emelia Loron, MD;  Location: [facility] OR;  Service: General;  Laterality: Left;  LMA     Outpatient Encounter Medications as of 01/15/2023  Medication Sig   Calcium Carb-Cholecalciferol (CALCIUM 600 + D PO) Take by mouth.   levothyroxine (SYNTHROID) 25 MCG tablet TAKE 1 TABLET(25 MCG) BY MOUTH DAILY BEFORE BREAKFAST   Probiotic Product (ALIGN PO) Take by mouth.   tamoxifen (NOLVADEX) 10 MG tablet Take 1 tablet (10 mg total) by mouth every other day.   Vitamin D, Ergocalciferol, (DRISDOL) 1.25 MG (50000 UNIT) CAPS capsule Take 1 capsule (50,000 Units total) by mouth every 7 (seven) days.   No facility-administered encounter medications on file as of 01/15/2023.     Today's Vitals   01/15/23 0905  BP: 132/74  Pulse: 70  Resp: 20  Temp: 97.9 F (36.6 C)  SpO2: 99%  Weight: 176 lb 1.6 oz (79.9 kg)   Body mass index is 29.34 kg/m.   PHYSICAL EXAM GENERAL:alert, no distress and comfortable SKIN: no rash  EYES: sclera clear NECK: without mass LYMPH:  no palpable cervical or supraclavicular lymphadenopathy  LUNGS: normal breathing effort HEART: no lower extremity edema ABDOMEN: abdomen soft, non-tender and normal bowel sounds NEURO: alert & oriented x 3 with fluent  speech, no focal motor/sensory deficits Breast exam: No nipple discharge or inversion.  S/p left lumpectomy, incisions completely healed with mild scar tissue and faint hyperpigmentation from RT.  No palpable mass or nodularity in either breast or axilla that I could appreciate.    CBC    Component Value Date/Time   WBC 3.7 (L) 01/15/2023 0853   WBC 3.6 (L) 08/21/2022 0851   RBC 4.18 01/15/2023 0853   HGB 13.4 01/15/2023 0853   HGB 14.1 05/15/2020 0857   HCT 38.6 01/15/2023 0853   HCT 41.3 05/15/2020 0857   PLT 195 01/15/2023 0853   PLT 234 05/15/2020 0857   MCV 92.3 01/15/2023 0853   MCV 91 05/15/2020 0857   MCH 32.1 01/15/2023 0853   MCHC 34.7 01/15/2023 0853   RDW 11.8 01/15/2023 0853   RDW 12.6 05/15/2020 0857   LYMPHSABS 1.0 01/15/2023 0853   LYMPHSABS 1.5 05/15/2020 0857   MONOABS 0.4 01/15/2023 0853   EOSABS 0.1 01/15/2023 0853   EOSABS 0.1 05/15/2020 0857   BASOSABS 0.0 01/15/2023 0853   BASOSABS 0.1 05/15/2020 0857     CMP     Component Value Date/Time   NA 140 01/15/2023 0853   NA 140 05/15/2020 0857   K 4.1 01/15/2023 0853   CL 105 01/15/2023 0853   CO2 26 01/15/2023 0853   GLUCOSE 90 01/15/2023 0853   BUN 14 01/15/2023 0853   BUN 13 05/15/2020 0857   CREATININE 0.72 01/15/2023 0853   CALCIUM 9.1 01/15/2023 0853   PROT 7.0 01/15/2023 0853   PROT 7.5 05/15/2020 0857   ALBUMIN 4.1 01/15/2023 0853   ALBUMIN 4.8 05/15/2020 0857   AST 22 01/15/2023 0853   ALT 34 01/15/2023 0853   ALKPHOS 46 01/15/2023 0853   BILITOT 0.4 01/15/2023 0853   GFRNONAA >60 01/15/2023 0853   GFRAA 110 05/15/2020 0857     ASSESSMENT & PLAN: Tara Yates is a 57 y.o. post-menopausal female with    New left breast DCIS, grade 2, ER/PR - -found on screening breast MRI 12/19/21 which was monitoring ALH (left breast biopsies 04/09/21 and 07/11/21); one of the abnormal areas had enlarged since the previous MRI and was rebiopsied 12/27/2021 again showing [facility] -Given the  enlargement she underwent left lumpectomy by Dr. Dwain Sarna 02/06/22, path showed grade 2 DCIS, apocrine type, with clear margins and extensive ALH within the breast.  ER/PR negative -This is a new DCIS, not a recurrence, but has been completely removed in surgery which was the curative treatment -S/p adjuvant radiation by Dr. Mitzi Hansen 03/18/2022- 04/15/2022 -She began tamoxifen, tolerating 10 mg every other day, I also reviewed 5 mg daily. She is tolerating well except brittle hair and weight gain. She is working on both.  -Tara Yates is clinically doing well.  Exam is benign, labs are stable.  Recent mammogram 09/2022 is negative with breast density category D - continue breast cancer surveillance and tamoxifen, for a total of 3 years, and high risk screening program -  Next MRI 03/2023 -Follow-up in 6 months, or sooner if needed   2. Left breast DCIS, grade 2, ER/PR+ -Screening detected; Biopsy on 10/16/20 confirmed intermediate grade DCIS, ER/PR+. -S/p left lumpectomy on 12/18/20 showed grade 2 DCIS with clear margins  -She declined adjuvant radiation due to family situation, she needed to be in [place] to take care of her father -she eventually started Tamoxifen at survivorship visit 06/27/21. She developed joint pain about a month later, and her dose was dropped to 10 mg. She had fatigue, joint pain, and hot flashes and dose was further reduced to 5 mg 09/2021, tolerating much better -she has had two left breast biopsies, on 04/29/21 and 07/11/21, both showing ALH. She continues surveillance with alternating mammography and breast MRI staggered 6 months apart.   3. Negative genetic testing 11/08/20 - VUS in CDKN2A   PLAN: -Recent mammogram and today's labs reviewed -Continue breast cancer surveillance and tamoxifen (can take 10 mg every other day or 5 mg daily) -Screening MRI 03/2023, I will call her with results -Follow-up in 6 months, or sooner if needed  Orders Placed This Encounter  Procedures    MR BREAST BILATERAL W WO CONTRAST INC CAD    Standing Status:   Future    Standing Expiration Date:   01/15/2024    Order Specific Question:   If indicated for the ordered procedure, I authorize the administration of contrast media per Radiology protocol    Answer:   Yes    Order Specific Question:   What is the patient's sedation requirement?    Answer:   No Sedation    Order Specific Question:   Does the patient have a pacemaker or implanted devices?    Answer:   No    Order Specific Question:   Preferred imaging location?    Answer:   [facility] (table limit-550lbs)     All questions were answered. The patient knows to call the clinic with any problems, questions or concerns. No barriers to learning were detected.   Tara Glad, NP-C 01/15/2023

## 2023-03-17 ENCOUNTER — Other Ambulatory Visit: Payer: 59

## 2023-03-27 ENCOUNTER — Other Ambulatory Visit: Payer: Self-pay | Admitting: Family Medicine

## 2023-03-27 DIAGNOSIS — E039 Hypothyroidism, unspecified: Secondary | ICD-10-CM

## 2023-03-29 ENCOUNTER — Other Ambulatory Visit: Payer: 59

## 2023-04-26 ENCOUNTER — Ambulatory Visit
Admission: RE | Admit: 2023-04-26 | Discharge: 2023-04-26 | Disposition: A | Discharge status: Home / Self Care | Payer: 59 | Source: Ambulatory Visit | Attending: Nurse Practitioner | Admitting: Nurse Practitioner

## 2023-04-26 DIAGNOSIS — N6092 Unspecified benign mammary dysplasia of left breast: Secondary | ICD-10-CM

## 2023-04-26 DIAGNOSIS — D0512 Intraductal carcinoma in situ of left breast: Secondary | ICD-10-CM

## 2023-04-26 MED ORDER — GADOPICLENOL 0.5 MMOL/ML IV SOLN
8.0000 mL | Freq: Once | INTRAVENOUS | Status: AC | PRN
Start: 2023-04-26 — End: 2023-04-26
  Administered 2023-04-26: 8 mL via INTRAVENOUS

## 2023-05-28 ENCOUNTER — Encounter: Payer: Self-pay | Admitting: Family Medicine

## 2023-05-28 ENCOUNTER — Ambulatory Visit: Payer: 59 | Admitting: Family Medicine

## 2023-05-28 VITALS — BP 104/70 | HR 83 | Temp 98.3°F | Ht 64.96 in | Wt 180.0 lb

## 2023-05-28 DIAGNOSIS — R051 Acute cough: Secondary | ICD-10-CM

## 2023-05-28 DIAGNOSIS — J00 Acute nasopharyngitis [common cold]: Secondary | ICD-10-CM

## 2023-05-28 DIAGNOSIS — J0140 Acute pansinusitis, unspecified: Secondary | ICD-10-CM | POA: Diagnosis not present

## 2023-05-28 MED ORDER — BENZONATATE 100 MG PO CAPS: 100.0000 mg | ORAL_CAPSULE | Freq: Two times a day (BID) | ORAL | 0 refills | Status: DC | PRN

## 2023-05-28 MED ORDER — AZITHROMYCIN 250 MG PO TABS: ORAL_TABLET | ORAL | 0 refills | Status: AC

## 2023-05-28 NOTE — Progress Notes (Signed)
Established Patient Office Visit   Subjective  Patient ID: Tara Yates, female    DOB: 11-Oct-1965  Age: 57 y.o. MRN: [id number]  Chief Complaint  Patient presents with   Cough    Patient came in today for a cough and nasal congestion that started a week ago, yellow mucus, patient is taking Mucinex DM     Patient is a 57 year old female seen for acute concern.  Patient endorses nasal congestion, cough x 1 week.  Feels like needs to cough up phlegm but having difficulty getting up.  Endorses facial pressure/pain and voice sounding congested..  Denies fever, chills, headache, ear pain/pressure.  Cough improving somewhat today.  Patient under increased stress at work due to several people quitting.  Pt's father also died this week and services are on the weekend.  Cough    Patient Active Problem List   Diagnosis Date Noted   History of breast biopsy 01/04/2021   Genetic testing 11/16/2020   Ductal carcinoma in situ of left breast 11/09/2020   Family history of breast cancer 11/09/2020   At risk for dehydration 06/13/2020   Mood disorder (HCC), with emotional eating 05/29/2020   Other hyperlipidemia 05/29/2020   Insulin resistance 05/29/2020   Vitamin D deficiency 05/29/2020   Hyperlipidemia 05/15/2020   Other fatigue 05/15/2020   SOBOE (shortness of breath on exertion) 05/15/2020   GAD (generalized anxiety disorder) 05/15/2020   History of constipation 05/15/2020   Depression 05/15/2020   At risk for heart disease 05/15/2020   Plantar fasciitis 01/04/2020   Hypocholesterolemia 08/24/2018   Palpitations 03/19/2018   Adjustment disorder with depressed mood 10/01/2016   Hot flashes 05/20/2016   Preventative health care 05/20/2016   Depressive disorder 04/14/2016   Radial scar of breast 03/15/2015   Shingles rash 02/14/2015   Arthralgia 09/14/2014   Bilateral low back pain 09/14/2014   Bloating 09/14/2014   Constipation 09/14/2014   Hypothyroidism 09/14/2014   Insomnia  09/14/2014   Past Medical History:  Diagnosis Date   Allergy    Anxiety    Back pain    Breast cancer (HCC)    Constipation    Depression    Ductal carcinoma in situ of left breast 11/09/2020   Family history of breast cancer 11/09/2020   HSV (herpes simplex virus) anogenital infection    Hyperlipidemia    Hypothyroidism    Joint pain    Plantar fasciitis, bilateral    SOBOE (shortness of breath on exertion)    Past Surgical History:  Procedure Laterality Date   BREAST BIOPSY Right 02/28/2015   BREAST BIOPSY Left 07/11/2021   BREAST EXCISIONAL BIOPSY Right 03/29/2015   had seed placement as wel   BREAST LUMPECTOMY WITH RADIOACTIVE SEED LOCALIZATION Left 12/18/2020   Procedure: LEFT BREAST LUMPECTOMY WITH RADIOACTIVE SEED LOCALIZATION;  Surgeon: Emelia Loron, MD;  Location: [facility];  Service: General;  Laterality: Left;   CARPAL TUNNEL RELEASE Right 2022   ENDOMETRIAL ABLATION     MOUTH SURGERY     RADIOACTIVE SEED GUIDED EXCISIONAL BREAST BIOPSY Right 04/03/2015   Procedure: RADIOACTIVE SEED GUIDED EXCISIONAL BREAST BIOPSY;  Surgeon: Emelia Loron, MD;  Location: [facility];  Service: General;  Laterality: Right;   RADIOACTIVE SEED GUIDED EXCISIONAL BREAST BIOPSY Left 02/06/2022   Procedure: LEFT BREAST SEED GUIDED EXCISIONAL BIOPSY;  Surgeon: Emelia Loron, MD;  Location: [facility] OR;  Service: General;  Laterality: Left;  LMA   Social History   Tobacco Use  Smoking status: Former    Current packs/day: 0.00    Average packs/day: 1 pack/day for 29.0 years (29.0 ttl pk-yrs)    Types: Cigarettes    Start date: 17    Quit date: 2000    Years since quitting: 24.9   Smokeless tobacco: Never   Tobacco comments:    Quit 20 years ago  Vaping Use   Vaping status: Never Used  Substance Use Topics   Alcohol use: Yes    Alcohol/week: 7.0 - 14.0 standard drinks of alcohol    Types: 7 - 14 Glasses of wine per week    Comment: 1-2  glasses of wine a day, maybe a beer   Drug use: Never   Family History  Problem Relation Age of Onset   Heart disease Mother    Heart failure Mother    Heart attack Mother    Hyperlipidemia Mother    Sudden death Mother    Colon polyps Father    Heart disease Father    Hyperlipidemia Father    Hypertension Father    Thyroid disease Father    Sleep apnea Father    Obesity Father    Breast cancer Paternal Grandmother        dx 29s, d. 56   Breast cancer Other        PGM's sisters, x2, dx unknown age   Colon cancer Neg Hx    Esophageal cancer Neg Hx    Rectal cancer Neg Hx    Stomach cancer Neg Hx    No Known Allergies    Review of Systems  Respiratory:  Positive for cough.    Negative unless stated above    Objective:     BP 104/70 (BP Location: Left Arm, Patient Position: Sitting, Cuff Size: Large)   Pulse 83   Temp 98.3 F (36.8 C) (Oral)   Ht 5' 4.96" (1.65 m)   Wt 180 lb (81.6 kg)   LMP 07/07/2018   SpO2 97%   BMI 29.99 kg/m  BP Readings from Last 3 Encounters:  05/28/23 104/70  01/15/23 132/74  08/21/22 120/70   Wt Readings from Last 3 Encounters:  05/28/23 180 lb (81.6 kg)  01/15/23 176 lb 1.6 oz (79.9 kg)  08/21/22 167 lb 6.4 oz (75.9 kg)      Physical Exam Constitutional:      General: She is not in acute distress.    Appearance: Normal appearance.  HENT:     Head: Normocephalic and atraumatic.     Right Ear: Hearing normal. Tympanic membrane is not erythematous.     Left Ear: Hearing normal. There is impacted cerumen.     Nose:     Right Sinus: Maxillary sinus tenderness and frontal sinus tenderness present.     Left Sinus: Maxillary sinus tenderness and frontal sinus tenderness present.     Mouth/Throat:     Mouth: Mucous membranes are moist.  Cardiovascular:     Rate and Rhythm: Normal rate and regular rhythm.     Heart sounds: Normal heart sounds. No murmur heard.    No gallop.  Pulmonary:     Effort: Pulmonary effort is normal.  No respiratory distress.     Breath sounds: Normal breath sounds. No wheezing, rhonchi or rales.  Skin:    General: Skin is warm and dry.  Neurological:     Mental Status: She is alert and oriented to person, place, and time.       05/28/2023    3:42  PM 07/17/2022    4:25 PM 08/19/2021   10:40 AM  Depression screen PHQ 2/9  Decreased Interest 0 0 0  Down, Depressed, Hopeless 0 0 1  PHQ - 2 Score 0 0 1  Altered sleeping 3 0 0  Tired, decreased energy 2 0 1  Change in appetite 1 0 0  Feeling bad or failure about yourself  0 0 1  Trouble concentrating 0 0 1  Moving slowly or fidgety/restless 0 0 0  Suicidal thoughts 0 0 0  PHQ-9 Score 6 0 4  Difficult doing work/chores Not difficult at all  Not difficult at all      05/28/2023    3:43 PM 04/17/2021    4:36 PM 08/17/2020   12:59 PM  GAD 7 : Generalized Anxiety Score  Nervous, Anxious, on Edge 1 0 1  Control/stop worrying 1 0 2  Worry too much - different things 1 1 2   Trouble relaxing 1 0 0  Restless 1 0 0  Easily annoyed or irritable 0 0 0  Afraid - awful might happen 0 0 2  Total GAD 7 Score 5 1 7   Anxiety Difficulty Not difficult at all       No results found for any visits on 05/28/23.    Assessment & Plan:  Acute pansinusitis, recurrence not specified -     Azithromycin; Take 2 tablets on day 1, then 1 tablet daily on days 2 through 5  Dispense: 6 tablet; Refill: 0  Acute cough -     Benzonatate; Take 1 capsule (100 mg total) by mouth 2 (two) times daily as needed for cough.  Dispense: 20 capsule; Refill: 0  Acute nasopharyngitis  Start ABX for sinusitis.  Tessalon for cough.  Continue OTC cough/cold medications, rest, hydration.  Self-care encouraged due to recent stress.  No follow-ups on file.   Deeann Saint, MD

## 2023-06-29 ENCOUNTER — Other Ambulatory Visit: Payer: Self-pay | Admitting: Hematology

## 2023-07-16 ENCOUNTER — Inpatient Hospital Stay: Payer: 59 | Admitting: Hematology

## 2023-07-16 ENCOUNTER — Inpatient Hospital Stay: Payer: 59

## 2023-08-31 ENCOUNTER — Other Ambulatory Visit: Payer: Self-pay

## 2023-08-31 ENCOUNTER — Other Ambulatory Visit: Payer: Self-pay | Admitting: Family Medicine

## 2023-08-31 DIAGNOSIS — Z9889 Other specified postprocedural states: Secondary | ICD-10-CM

## 2023-08-31 DIAGNOSIS — D0512 Intraductal carcinoma in situ of left breast: Secondary | ICD-10-CM

## 2023-09-01 ENCOUNTER — Ambulatory Visit: Payer: 59 | Admitting: Hematology

## 2023-09-01 ENCOUNTER — Other Ambulatory Visit: Payer: 59

## 2023-09-23 NOTE — Assessment & Plan Note (Signed)
-  Discovered on screening mammography. Biopsy on 10/16/20 confirmed intermediate grade DCIS, ER/PR+. -Left lumpectomy on 12/18/20 again showed grade 2 DCIS with clear margins  -She declined adjuvant radiation. -she also initially declined antiestrogen therapy but agreed to start tamoxifen at survivorship visit 06/27/21. She transitioned from sertraline and began tamoxifen, and dose reduced to '5mg'$  daily due to tolerance issue --she has had two left breast biopsies, on 04/29/21 and 07/11/21, both showing ALH. She continues surveillance with alternating mammography and breast MRI.  -she underwent lumpectomy for left breast DCIS in 02/2022, margins clear  -she completed RT in 04/2022

## 2023-09-24 ENCOUNTER — Inpatient Hospital Stay: Payer: 59 | Admitting: Hematology

## 2023-09-24 ENCOUNTER — Encounter: Payer: Self-pay | Admitting: Hematology

## 2023-09-24 ENCOUNTER — Inpatient Hospital Stay: Payer: 59 | Attending: Hematology

## 2023-09-24 ENCOUNTER — Other Ambulatory Visit: Payer: Self-pay | Admitting: Family Medicine

## 2023-09-24 VITALS — BP 112/64 | HR 75 | Temp 98.0°F | Resp 17 | Wt 169.9 lb

## 2023-09-24 DIAGNOSIS — N6022 Fibroadenosis of left breast: Secondary | ICD-10-CM | POA: Insufficient documentation

## 2023-09-24 DIAGNOSIS — Z79810 Long term (current) use of selective estrogen receptor modulators (SERMs): Secondary | ICD-10-CM | POA: Insufficient documentation

## 2023-09-24 DIAGNOSIS — E039 Hypothyroidism, unspecified: Secondary | ICD-10-CM | POA: Diagnosis not present

## 2023-09-24 DIAGNOSIS — Z79890 Hormone replacement therapy (postmenopausal): Secondary | ICD-10-CM | POA: Insufficient documentation

## 2023-09-24 DIAGNOSIS — Z923 Personal history of irradiation: Secondary | ICD-10-CM | POA: Insufficient documentation

## 2023-09-24 DIAGNOSIS — D0512 Intraductal carcinoma in situ of left breast: Secondary | ICD-10-CM

## 2023-09-24 DIAGNOSIS — E785 Hyperlipidemia, unspecified: Secondary | ICD-10-CM | POA: Diagnosis not present

## 2023-09-24 DIAGNOSIS — N6489 Other specified disorders of breast: Secondary | ICD-10-CM | POA: Insufficient documentation

## 2023-09-24 DIAGNOSIS — Z79899 Other long term (current) drug therapy: Secondary | ICD-10-CM | POA: Insufficient documentation

## 2023-09-24 LAB — CMP (CANCER CENTER ONLY)
ALT: 26 U/L (ref 0–44)
AST: 18 U/L (ref 15–41)
Albumin: 4.4 g/dL (ref 3.5–5.0)
Alkaline Phosphatase: 41 U/L (ref 38–126)
Anion gap: 7 (ref 5–15)
BUN: 15 mg/dL (ref 6–20)
CO2: 28 mmol/L (ref 22–32)
Calcium: 8.9 mg/dL (ref 8.9–10.3)
Chloride: 103 mmol/L (ref 98–111)
Creatinine: 0.69 mg/dL (ref 0.44–1.00)
GFR, Estimated: >60 mL/min (ref >60)
Glucose, Bld: 93 mg/dL (ref 70–99)
Potassium: 4.3 mmol/L (ref 3.5–5.1)
Sodium: 138 mmol/L (ref 135–145)
Total Bilirubin: 0.3 mg/dL (ref 0.0–1.2)
Total Protein: 6.8 g/dL (ref 6.5–8.1)

## 2023-09-24 LAB — CBC WITH DIFFERENTIAL (CANCER CENTER ONLY)
Abs Immature Granulocytes: 0 10*3/uL (ref 0.00–0.07)
Basophils Absolute: 0 10*3/uL (ref 0.0–0.1)
Basophils Relative: 1 %
Eosinophils Absolute: 0.1 10*3/uL (ref 0.0–0.5)
Eosinophils Relative: 2 %
HCT: 39.8 % (ref 36.0–46.0)
Hemoglobin: 13.5 g/dL (ref 12.0–15.0)
Immature Granulocytes: 0 %
Lymphocytes Relative: 28 %
Lymphs Abs: 1.3 10*3/uL (ref 0.7–4.0)
MCH: 31.7 pg (ref 26.0–34.0)
MCHC: 33.9 g/dL (ref 30.0–36.0)
MCV: 93.4 fL (ref 80.0–100.0)
Monocytes Absolute: 0.5 10*3/uL (ref 0.1–1.0)
Monocytes Relative: 10 %
Neutro Abs: 2.6 10*3/uL (ref 1.7–7.7)
Neutrophils Relative %: 59 %
Platelet Count: 193 10*3/uL (ref 150–400)
RBC: 4.26 MIL/uL (ref 3.87–5.11)
RDW: 11.8 % (ref 11.5–15.5)
WBC Count: 4.4 10*3/uL (ref 4.0–10.5)
nRBC: 0 % (ref 0.0–0.2)

## 2023-09-24 NOTE — Progress Notes (Signed)
 [facility]   [phone] Fax:([phone]   Clinic Follow up Note   Patient Care Team: Deeann Saint, MD as PCP - General (Family Medicine) Pershing Proud, RN as Oncology Nurse Navigator Donnelly Angelica, RN as Oncology Nurse Navigator Emelia Loron, MD as Consulting Physician (General Surgery) Malachy Mood, MD as Consulting Physician (Hematology) Dorothy Puffer, MD as Consulting Physician (Radiation Oncology) Pollyann Samples, NP as Nurse Practitioner (Nurse Practitioner)  Date of Service:  09/24/2023  CHIEF COMPLAINT: f/u of DCIS   CURRENT THERAPY:  Tamoxifen 10 mg every other day  Oncology History   Ductal carcinoma in situ of left breast -Discovered on screening mammography. Biopsy on 10/16/20 confirmed intermediate grade DCIS, ER/PR+. -Left lumpectomy on 12/18/20 again showed grade 2 DCIS with clear margins  -She declined adjuvant radiation. -she also initially declined antiestrogen therapy but agreed to start tamoxifen at survivorship visit 06/27/21. She transitioned from sertraline and began tamoxifen, and dose reduced to 5mg  daily due to tolerance issue --she has had two left breast biopsies, on 04/29/21 and 07/11/21, both showing ALH. She continues surveillance with alternating mammography and breast MRI.  -she underwent lumpectomy for left breast DCIS in 02/2022, margins clear  -she completed RT in 04/2022   Assessment and Plan    Breast cancer follow-up A 58 year old female with breast cancer, currently on tamoxifen 10 mg every other day for over two years. She reports intermittent shooting pain and dimpling in the breast, likely due to nerve damage and scar tissue from previous surgery and radiation. No new concerning symptoms reported. Mammogram is scheduled for next week, and the last MRI was normal. She has dense breast tissue, category D, which may affect imaging. Discussed the option of a contrast-enhanced mammogram to improve imaging  quality due to dense breast tissue. She is willing to proceed with this option. - Continue tamoxifen 10 mg every other day until December 2025. - Order mammogram with IV contrast for enhanced imaging due to dense breast tissue. - Order breast MRI for September 2025, six months after the mammogram. - Schedule screening mammogram for March 2026. - Follow up in one year with the oncology team, then transition to primary care or gynecologist.      SUMMARY OF ONCOLOGIC HISTORY: Oncology History  Ductal carcinoma in situ of left breast  10/16/2020 Pathology Results   Breast, left, needle core biopsy, UOQ posterior - DUCTAL CARCINOMA IN SITU WITH CALCIFICATIONS, INTERMEDIATE NUCLEAR GRADE  PROGNOSTIC INDICATORS Results: IMMUNOHISTOCHEMICAL AND MORPHOMETRIC ANALYSIS PERFORMED MANUALLY Estrogen Receptor: 95%, POSITIVE, STRONG STAINING INTENSITY Progesterone Receptor: 20%, POSITIVE, STRONG STAINING INTENSITY   11/09/2020 Initial Diagnosis   Ductal carcinoma in situ of left breast   11/16/2020 Genetic Testing   Negative hereditary cancer genetic testing: no pathogenic variants detected in [facility] BRCAPlus Panel.  The report date is Nov 16, 2020. The BRCAplus panel offered by [employer] and includes sequencing and deletion/duplication analysis for the following 8 genes: ATM, BRCA1, BRCA2, CDH1, CHEK2, PALB2, PTEN, and TP53.    Negative hereditary cancer genetic testing: no pathogenic variants detected in [facility] CustomNext-Cancer +RNAinsight Panel.  Variant of uncertain significance detected in CDKN2A at c.440C>T (p.A147V). The report date is Nov 26, 2020.   The CustomNext-Cancer+RNAinsight panel offered by [employer] includes sequencing and rearrangement analysis for the following 47 genes:  APC, ATM, AXIN2, BARD1, BMPR1A, BRCA1, BRCA2, BRIP1, CDH1, CDK4, CDKN2A, CHEK2, DICER1, EPCAM, GREM1, HOXB13, MEN1, MLH1, MSH2, MSH3, MSH6, MUTYH, NBN, NF1, NF2, NTHL1, PALB2, PMS2,  POLD1, POLE, PTEN,  RAD51C, RAD51D, RECQL, RET, SDHA, SDHAF2, SDHB, SDHC, SDHD, SMAD4, SMARCA4, STK11, TP53, TSC1, TSC2, and VHL.  RNA data is routinely analyzed for use in variant interpretation for all genes.    12/18/2020 Surgery   Left Lumpectomy  FINAL MICROSCOPIC DIAGNOSIS:   A. BREAST, LEFT, LUMPECTOMY:  - Ductal carcinoma in situ, intermediate grade, involving areas of  sclerosing adenosis, see comment  - Resection margins are negative for DCIS; closest is the posterior  margin at 3 mm  - Biopsy site changes  - Calcifications  - See oncology table   B. BREAST, LEFT ADDITIONAL LATERAL MARGIN, EXCISION:  - Fibrocystic change with sclerosing adenosis and calcifications  - Negative for carcinoma   C. BREAST, LEFT ADDITIONAL POSTERIOR MARGIN, EXCISION:  - Benign breast parenchyma, negative for carcinoma    12/18/2020 Cancer Staging   Staging form: Breast, AJCC 8th Edition - Pathologic stage from 12/18/2020: Stage Unknown (pTis (DCIS), pNX, cM0, G2, ER+, PR+, HER2: Not Assessed) - Signed by Pollyann Samples, NP on 06/27/2021 Stage prefix: Initial diagnosis Histologic grading system: 3 grade system   06/27/2021 Survivorship   SCP delivered by Santiago Glad, NP   07/2021 -  Anti-estrogen oral therapy   Pending start of chemoprevention with tamoxifen in 07/2021    12/19/2021 Imaging   IMPRESSION: Breast MRI impression:  1. Area of non mass enhancement in the left breast has increased in size from the prior MRI. It lies just below cylinder shaped biopsy clip. Given the position of the post biopsy marker clip and the interval increase in size of this enhancement, re-biopsy is recommended. 2. No other areas of abnormal enhancement in the left breast. 3. No evidence of right breast malignancy.   RECOMMENDATION: 1. MRI guided core needle biopsy of the area of non mass enhancement in the left breast.   12/27/2021 Pathology Results   Diagnosis Breast, left, needle core biopsy, upper inner quadrant,  cylinder clip - LOBULAR NEOPLASIA (ATYPICAL LOBULAR HYPERPLASIA). - FIBROCYSTIC CHANGES WITH USUAL DUCTAL HYPERPLASIA AND CALCIFICATIONS. - SEE NOTE.   02/06/2022 Surgery   Left breast lumpectomy by Dr. Harden Mo   02/06/2022 Pathology Results   FINAL MICROSCOPIC DIAGNOSIS: A. BREAST, LEFT, LUMPECTOMY: Ductal carcinoma in-situ, apocrine type with clear margins of resection. Radial sclerosing lesion with extensive atypical lobular hyperplasia in the immediate vicinity and elsewhere in the breast. Multiple foci of sclerosing adenosis. Invasive carcinoma is not identified.  Estrogen receptors and progesterone receptors both negative   03/18/2022 -  Radiation Therapy   Adjuvant radiation by Dr. Mitzi Hansen, plan to complete 04/15/2022      Discussed the use of AI scribe software for clinical note transcription with the patient, who gave verbal consent to proceed.  History of Present Illness   Tara Yates is a 58 year old female with breast cancer who presents for follow-up.  She is managing her breast cancer with tamoxifen 10 mg every other day for over two years and plans to continue this regimen until the end of the year. She experiences occasional 'lightning bolts of pain' and dimpling in the area of her previous breast cancer, which she attributes to scar tissue from radiation therapy. There is no breast tenderness or discomfort aside from the occasional shooting pain. She has dense breast tissue and undergoes regular mammograms and MRIs. The last MRI was normal, and she is scheduled for a mammogram next week. She has been postmenopausal for at least five years.  All other systems were reviewed with the patient and are negative.  MEDICAL HISTORY:  Past Medical History:  Diagnosis Date   Allergy    Anxiety    Back pain    Breast cancer (HCC)    Constipation    Depression    Ductal carcinoma in situ of left breast 11/09/2020   Family history of breast cancer  11/09/2020   HSV (herpes simplex virus) anogenital infection    Hyperlipidemia    Hypothyroidism    Joint pain    Plantar fasciitis, bilateral    SOBOE (shortness of breath on exertion)     SURGICAL HISTORY: Past Surgical History:  Procedure Laterality Date   BREAST BIOPSY Right 02/28/2015   BREAST BIOPSY Left 07/11/2021   BREAST EXCISIONAL BIOPSY Right 03/29/2015   had seed placement as wel   BREAST LUMPECTOMY WITH RADIOACTIVE SEED LOCALIZATION Left 12/18/2020   Procedure: LEFT BREAST LUMPECTOMY WITH RADIOACTIVE SEED LOCALIZATION;  Surgeon: Emelia Loron, MD;  Location: [facility];  Service: General;  Laterality: Left;   CARPAL TUNNEL RELEASE Right 2022   ENDOMETRIAL ABLATION     MOUTH SURGERY     RADIOACTIVE SEED GUIDED EXCISIONAL BREAST BIOPSY Right 04/03/2015   Procedure: RADIOACTIVE SEED GUIDED EXCISIONAL BREAST BIOPSY;  Surgeon: Emelia Loron, MD;  Location: [facility];  Service: General;  Laterality: Right;   RADIOACTIVE SEED GUIDED EXCISIONAL BREAST BIOPSY Left 02/06/2022   Procedure: LEFT BREAST SEED GUIDED EXCISIONAL BIOPSY;  Surgeon: Emelia Loron, MD;  Location: [facility] OR;  Service: General;  Laterality: Left;  LMA    I have reviewed the social history and family history with the patient and they are unchanged from previous note.  ALLERGIES:  has no known allergies.  MEDICATIONS:  Current Outpatient Medications  Medication Sig Dispense Refill   Calcium Carb-Cholecalciferol (CALCIUM 600 + D PO) Take by mouth.     cholecalciferol (VITAMIN D3) 25 MCG (1000 UNIT) tablet Take 1,000 Units by mouth daily.     levothyroxine (SYNTHROID) 25 MCG tablet TAKE 1 TABLET(25 MCG) BY MOUTH DAILY BEFORE BREAKFAST 90 tablet 1   Multiple Vitamin (MULTI-VITAMIN) tablet Take by mouth.     Nutritional Supplements (JUICE PLUS FIBRE PO)      Probiotic Product (ALIGN PO) Take by mouth.     tamoxifen (NOLVADEX) 10 MG tablet TAKE 1 TABLET BY MOUTH EVERY  OTHER DAY 15 tablet 5   No current facility-administered medications for this visit.    PHYSICAL EXAMINATION: ECOG PERFORMANCE STATUS: 0 - Asymptomatic  Vitals:   09/24/23 0852  BP: 112/64  Pulse: 75  Resp: 17  Temp: 98 F (36.7 C)  SpO2: 98%   Wt Readings from Last 3 Encounters:  09/24/23 169 lb 14.4 oz (77.1 kg)  05/28/23 180 lb (81.6 kg)  01/15/23 176 lb 1.6 oz (79.9 kg)     GENERAL:alert, no distress and comfortable SKIN: skin color, texture, turgor are normal, no rashes or significant lesions EYES: normal, Conjunctiva are pink and non-injected, sclera clear NECK: supple, thyroid normal size, non-tender, without nodularity LYMPH:  no palpable lymphadenopathy in the cervical, axillary  LUNGS: clear to auscultation and percussion with normal breathing effort HEART: regular rate & rhythm and no murmurs and no lower extremity edema ABDOMEN:abdomen soft, non-tender and normal bowel sounds Musculoskeletal:no cyanosis of digits and no clubbing  NEURO: alert & oriented x 3 with fluent speech, no focal motor/sensory deficits Breasts: Breast inspection showed them to be symmetrical with no nipple discharge.  Status  post bilateral lumpectomy.  Palpation of the breasts and axilla revealed no obvious mass that I could appreciate.       LABORATORY DATA:  I have reviewed the data as listed    Latest Ref Rng & Units 09/24/2023    8:34 AM 01/15/2023    8:53 AM 08/21/2022    8:51 AM  CBC  WBC 4.0 - 10.5 K/uL 4.4  3.7  3.6   Hemoglobin 12.0 - 15.0 g/dL 16.1  09.6  04.5   Hematocrit 36.0 - 46.0 % 39.8  38.6  40.3   Platelets 150 - 400 K/uL 193  195  222.0         Latest Ref Rng & Units 09/24/2023    8:34 AM 01/15/2023    8:53 AM 08/21/2022    8:51 AM  CMP  Glucose 70 - 99 mg/dL 93  90  97   BUN 6 - 20 mg/dL 15  14  15    Creatinine 0.44 - 1.00 mg/dL 4.09  8.11  9.14   Sodium 135 - 145 mmol/L 138  140  141   Potassium 3.5 - 5.1 mmol/L 4.3  4.1  5.0   Chloride 98 - 111 mmol/L  103  105  104   CO2 22 - 32 mmol/L 28  26  27    Calcium 8.9 - 10.3 mg/dL 8.9  9.1  9.3   Total Protein 6.5 - 8.1 g/dL 6.8  7.0  7.1   Total Bilirubin 0.0 - 1.2 mg/dL 0.3  0.4  0.5   Alkaline Phos 38 - 126 U/L 41  46  43   AST 15 - 41 U/L 18  22  19    ALT 0 - 44 U/L 26  34  23       RADIOGRAPHIC STUDIES: I have personally reviewed the radiological images as listed and agreed with the findings in the report. No results found.    Orders Placed This Encounter  Procedures   MR BREAST BILATERAL W WO CONTRAST INC CAD    Standing Status:   Future    Expected Date:   03/26/2024    Expiration Date:   09/23/2024    If indicated for the ordered procedure, I authorize the administration of contrast media per Radiology protocol:   Yes    What is the patient's sedation requirement?:   No Sedation    Does the patient have a pacemaker or implanted devices?:   No    Radiology Contrast Protocol - do NOT remove file path:   [email or web address]    Preferred imaging location?:   [facility] (table limit-550lbs)   All questions were answered. The patient knows to call the clinic with any problems, questions or concerns. No barriers to learning was detected. The total time spent in the appointment was 25 minutes.     Malachy Mood, MD 09/24/2023

## 2023-09-29 ENCOUNTER — Ambulatory Visit
Admission: RE | Admit: 2023-09-29 | Discharge: 2023-09-29 | Disposition: A | Discharge status: Home / Self Care | Payer: 59 | Source: Ambulatory Visit | Attending: Family Medicine | Admitting: Family Medicine

## 2023-09-29 DIAGNOSIS — Z9889 Other specified postprocedural states: Secondary | ICD-10-CM

## 2023-12-25 ENCOUNTER — Other Ambulatory Visit: Payer: Self-pay | Admitting: Nurse Practitioner

## 2024-01-20 ENCOUNTER — Other Ambulatory Visit

## 2024-01-22 ENCOUNTER — Other Ambulatory Visit: Payer: Self-pay | Admitting: Family Medicine

## 2024-01-22 DIAGNOSIS — E039 Hypothyroidism, unspecified: Secondary | ICD-10-CM

## 2024-01-29 ENCOUNTER — Encounter: Payer: Self-pay | Admitting: Family Medicine

## 2024-01-29 ENCOUNTER — Ambulatory Visit (INDEPENDENT_AMBULATORY_CARE_PROVIDER_SITE_OTHER): Admitting: Family Medicine

## 2024-01-29 VITALS — BP 112/62 | HR 75 | Temp 98.3°F | Ht 64.96 in | Wt 170.6 lb

## 2024-01-29 DIAGNOSIS — Z23 Encounter for immunization: Secondary | ICD-10-CM | POA: Diagnosis not present

## 2024-01-29 DIAGNOSIS — E039 Hypothyroidism, unspecified: Secondary | ICD-10-CM

## 2024-01-29 DIAGNOSIS — E782 Mixed hyperlipidemia: Secondary | ICD-10-CM

## 2024-01-29 DIAGNOSIS — Z0000 Encounter for general adult medical examination without abnormal findings: Secondary | ICD-10-CM | POA: Diagnosis not present

## 2024-01-29 DIAGNOSIS — E559 Vitamin D deficiency, unspecified: Secondary | ICD-10-CM | POA: Diagnosis not present

## 2024-01-29 NOTE — Progress Notes (Signed)
 Established Patient Office Visit   Subjective  Patient ID: Tara Yates, female    DOB: 08-18-1965  Age: 58 y.o. MRN: [id number]  Chief Complaint  Patient presents with   Annual Exam    Patient is a 58 year old female seen for CPE.  Patient states she is doing well overall.  Was being seen by [facility] for weight management on GLP-1.  Cut down to 163 pounds.  Had to stop medication prior to surgery to repair left meniscus.  Gained weight off medication despite continuing to exercise and eat healthy.  Has yet to restart medication.  Also on tamoxifen  which may be contributing to difficulty losing weight.    Patient Active Problem List   Diagnosis Date Noted   History of breast biopsy 01/04/2021   Genetic testing 11/16/2020   Ductal carcinoma in situ of left breast 11/09/2020   Family history of breast cancer 11/09/2020   At risk for dehydration 06/13/2020   Mood disorder (HCC), with emotional eating 05/29/2020   Other hyperlipidemia 05/29/2020   Insulin resistance 05/29/2020   Vitamin D  deficiency 05/29/2020   Hyperlipidemia 05/15/2020   Other fatigue 05/15/2020   SOBOE (shortness of breath on exertion) 05/15/2020   GAD (generalized anxiety disorder) 05/15/2020   History of constipation 05/15/2020   Depression 05/15/2020   At risk for heart disease 05/15/2020   Plantar fasciitis 01/04/2020   Hypocholesterolemia 08/24/2018   Palpitations 03/19/2018   Adjustment disorder with depressed mood 10/01/2016   Hot flashes 05/20/2016   Preventative health care 05/20/2016   Depressive disorder 04/14/2016   Radial scar of breast 03/15/2015   Shingles rash 02/14/2015   Arthralgia 09/14/2014   Bilateral low back pain 09/14/2014   Bloating 09/14/2014   Constipation 09/14/2014   Hypothyroidism 09/14/2014   Insomnia 09/14/2014   Past Medical History:  Diagnosis Date   Allergy    Anxiety    Back pain    Breast cancer (HCC)    Constipation    Depression    Ductal carcinoma in  situ of left breast 11/09/2020   Family history of breast cancer 11/09/2020   HSV (herpes simplex virus) anogenital infection    Hyperlipidemia    Hypothyroidism    Joint pain    Plantar fasciitis, bilateral    SOBOE (shortness of breath on exertion)    Past Surgical History:  Procedure Laterality Date   BREAST BIOPSY Right 02/28/2015   BREAST BIOPSY Left 07/11/2021   BREAST EXCISIONAL BIOPSY Right 03/29/2015   had seed placement as wel   BREAST LUMPECTOMY WITH RADIOACTIVE SEED LOCALIZATION Left 12/18/2020   Procedure: LEFT BREAST LUMPECTOMY WITH RADIOACTIVE SEED LOCALIZATION;  Surgeon: [name], MD;  Location: [facility];  Service: General;  Laterality: Left;   CARPAL TUNNEL RELEASE Right 2022   ENDOMETRIAL ABLATION     MOUTH SURGERY     RADIOACTIVE SEED GUIDED EXCISIONAL BREAST BIOPSY Right 04/03/2015   Procedure: RADIOACTIVE SEED GUIDED EXCISIONAL BREAST BIOPSY;  Surgeon: Cough Ebbie, MD;  Location: [facility];  Service: General;  Laterality: Right;   RADIOACTIVE SEED GUIDED EXCISIONAL BREAST BIOPSY Left 02/06/2022   Procedure: LEFT BREAST SEED GUIDED EXCISIONAL BIOPSY;  Surgeon: [name], MD;  Location: [facility] OR;  Service: General;  Laterality: Left;  LMA   Social History   Tobacco Use   Smoking status: Former    Current packs/day: 0.00    Average packs/day: 1 pack/day for 29.0 years (29.0 ttl pk-yrs)    Types: Cigarettes  Start date: 86    Quit date: 2000    Years since quitting: 25.5   Smokeless tobacco: Never   Tobacco comments:    Quit 20 years ago  Vaping Use   Vaping status: Never Used  Substance Use Topics   Alcohol use: Yes    Alcohol/week: 7.0 - 14.0 standard drinks of alcohol    Types: 7 - 14 Glasses of wine per week    Comment: 1-2 glasses of wine a day, maybe a beer   Drug use: Never   Family History  Problem Relation Age of Onset   Heart disease Mother    Heart failure Mother    Heart attack  Mother    Hyperlipidemia Mother    Sudden death Mother    Colon polyps Father    Heart disease Father    Hyperlipidemia Father    Hypertension Father    Thyroid  disease Father    Sleep apnea Father    Obesity Father    Breast cancer Paternal Grandmother        dx 29s, d. 88   Breast cancer Other        PGM's sisters, x2, dx unknown age   Colon cancer Neg Hx    Esophageal cancer Neg Hx    Rectal cancer Neg Hx    Stomach cancer Neg Hx    No Known Allergies  ROS Negative unless stated above    Objective:     BP 112/62 (BP Location: Left Arm, Patient Position: Sitting, Cuff Size: Normal)   Pulse 75   Temp 98.3 F (36.8 C) (Oral)   Ht 5' 4.96 (1.65 m)   Wt 170 lb 9.6 oz (77.4 kg)   LMP 07/07/2018   SpO2 97%   BMI 28.42 kg/m  BP Readings from Last 3 Encounters:  01/29/24 112/62  09/24/23 112/64  05/28/23 104/70   Wt Readings from Last 3 Encounters:  01/29/24 170 lb 9.6 oz (77.4 kg)  09/24/23 169 lb 14.4 oz (77.1 kg)  05/28/23 180 lb (81.6 kg)      Physical Exam Constitutional:      Appearance: Normal appearance.  HENT:     Head: Normocephalic and atraumatic.     Right Ear: Tympanic membrane, ear canal and external ear normal.     Left Ear: Tympanic membrane, ear canal and external ear normal.     Nose: Nose normal.     Mouth/Throat:     Mouth: Mucous membranes are moist.     Pharynx: No oropharyngeal exudate or posterior oropharyngeal erythema.  Eyes:     General: No scleral icterus.    Extraocular Movements: Extraocular movements intact.     Conjunctiva/sclera: Conjunctivae normal.     Pupils: Pupils are equal, round, and reactive to light.  Neck:     Thyroid : No thyromegaly.  Cardiovascular:     Rate and Rhythm: Normal rate and regular rhythm.     Pulses: Normal pulses.     Heart sounds: Normal heart sounds. No murmur heard.    No friction rub.  Pulmonary:     Effort: Pulmonary effort is normal.     Breath sounds: Normal breath sounds. No  wheezing, rhonchi or rales.  Abdominal:     General: Bowel sounds are normal.     Palpations: Abdomen is soft.     Tenderness: There is no abdominal tenderness.  Musculoskeletal:        General: No deformity. Normal range of motion.  Lymphadenopathy:  Cervical: No cervical adenopathy.  Skin:    General: Skin is warm and dry.     Findings: No lesion.  Neurological:     General: No focal deficit present.     Mental Status: She is alert and oriented to person, place, and time.  Psychiatric:        Mood and Affect: Mood normal.        Thought Content: Thought content normal.        01/29/2024    2:25 PM 05/28/2023    3:42 PM 07/17/2022    4:25 PM  Depression screen PHQ 2/9  Decreased Interest 0 0 0  Down, Depressed, Hopeless 0 0 0  PHQ - 2 Score 0 0 0  Altered sleeping 1 3 0  Tired, decreased energy 0 2 0  Change in appetite 0 1 0  Feeling bad or failure about yourself  0 0 0  Trouble concentrating 0 0 0  Moving slowly or fidgety/restless 0 0 0  Suicidal thoughts 0 0 0  PHQ-9 Score 1 6 0  Difficult doing work/chores  Not difficult at all       01/29/2024    2:25 PM 05/28/2023    3:43 PM 04/17/2021    4:36 PM 08/17/2020   12:59 PM  GAD 7 : Generalized Anxiety Score  Nervous, Anxious, on Edge 0 1 0 1  Control/stop worrying 0 1 0 2  Worry too much - different things 0 1 1 2   Trouble relaxing 0 1 0 0  Restless 0 1 0 0  Easily annoyed or irritable 0 0 0 0  Afraid - awful might happen 0 0 0 2  Total GAD 7 Score 0 5 1 7   Anxiety Difficulty  Not difficult at all       No results found for any visits on 01/29/24.    Assessment & Plan:   Well adult exam -     CBC with Differential/Platelet; Future -     TSH; Future -     Hemoglobin A1c; Future -     Lipid panel; Future -     Comprehensive metabolic panel with GFR; Future  Vitamin D  deficiency -     VITAMIN D  25 Hydroxy (Vit-D Deficiency, Fractures); Future  Mixed hyperlipidemia -     Lipid panel;  Future  Acquired hypothyroidism -     TSH; Future  Need for pneumococcal vaccine -     Pneumococcal conjugate vaccine 20-valent  Age-appropriate health screenings discussed.  Obtain labs.  Immunizations reviewed.  Pneumonia vaccine given this visit.  Colonoscopy done 10/08/2021.  Mammogram done 09/29/2023.  Pap with OB/GYN.  Last done 09/10/2020.  Next CPE in 1 year.  Continue Synthroid  25 mcg daily.  Adjust dose if needed based on results.  Pneumonia vaccine given this visit.  Return in about 1 year (around 01/28/2025), or if symptoms worsen or fail to improve, for physical.   Clotilda JONELLE Single, MD

## 2024-01-30 LAB — COMPREHENSIVE METABOLIC PANEL WITH GFR
AG Ratio: 2.3 (calc) (ref 1.0–2.5)
ALT: 35 U/L — ABNORMAL HIGH (ref 6–29)
AST: 23 U/L (ref 10–35)
Albumin: 4.8 g/dL (ref 3.6–5.1)
Alkaline phosphatase (APISO): 51 U/L (ref 37–153)
BUN: 12 mg/dL (ref 7–25)
CO2: 27 mmol/L (ref 20–32)
Calcium: 9.4 mg/dL (ref 8.6–10.4)
Chloride: 102 mmol/L (ref 98–110)
Creat: 0.61 mg/dL (ref 0.50–1.03)
Globulin: 2.1 g/dL (ref 1.9–3.7)
Glucose, Bld: 87 mg/dL (ref 65–99)
Potassium: 4 mmol/L (ref 3.5–5.3)
Sodium: 139 mmol/L (ref 135–146)
Total Bilirubin: 0.5 mg/dL (ref 0.2–1.2)
Total Protein: 6.9 g/dL (ref 6.1–8.1)
eGFR: 106 mL/min/1.73m2 (ref >=60)

## 2024-01-30 LAB — CBC WITH DIFFERENTIAL/PLATELET
Absolute Lymphocytes: 1371 {cells}/uL (ref 850–3900)
Absolute Monocytes: 432 {cells}/uL (ref 200–950)
Basophils Absolute: 41 {cells}/uL (ref 0–200)
Basophils Relative: 0.9 %
Eosinophils Absolute: 60 {cells}/uL (ref 15–500)
Eosinophils Relative: 1.3 %
HCT: 40.5 % (ref 35.0–45.0)
Hemoglobin: 13.3 g/dL (ref 11.7–15.5)
MCH: 31.7 pg (ref 27.0–33.0)
MCHC: 32.8 g/dL (ref 32.0–36.0)
MCV: 96.4 fL (ref 80.0–100.0)
MPV: 9.4 fL (ref 7.5–12.5)
Monocytes Relative: 9.4 %
Neutro Abs: 2696 {cells}/uL (ref 1500–7800)
Neutrophils Relative %: 58.6 %
Platelets: 201 Thousand/uL (ref 140–400)
RBC: 4.2 Million/uL (ref 3.80–5.10)
RDW: 13 % (ref 11.0–15.0)
Total Lymphocyte: 29.8 %
WBC: 4.6 Thousand/uL (ref 3.8–10.8)

## 2024-01-30 LAB — HEMOGLOBIN A1C
Hgb A1c MFr Bld: 5.1 % (ref <5.7)
Mean Plasma Glucose: 100 mg/dL
eAG (mmol/L): 5.5 mmol/L

## 2024-01-30 LAB — LIPID PANEL
Cholesterol: 203 mg/dL — ABNORMAL HIGH (ref <200)
HDL: 60 mg/dL (ref >=50)
LDL Cholesterol (Calc): 118 mg/dL — ABNORMAL HIGH
Non-HDL Cholesterol (Calc): 143 mg/dL — ABNORMAL HIGH (ref <130)
Total CHOL/HDL Ratio: 3.4 (calc) (ref <5.0)
Triglycerides: 139 mg/dL (ref <150)

## 2024-01-30 LAB — VITAMIN D 25 HYDROXY (VIT D DEFICIENCY, FRACTURES): Vit D, 25-Hydroxy: 61 ng/mL (ref 30–100)

## 2024-01-30 LAB — TSH: TSH: 2.66 m[IU]/L

## 2024-02-09 ENCOUNTER — Ambulatory Visit: Payer: Self-pay | Admitting: Family Medicine

## 2024-02-15 ENCOUNTER — Other Ambulatory Visit (HOSPITAL_BASED_OUTPATIENT_CLINIC_OR_DEPARTMENT_OTHER): Payer: Self-pay | Admitting: Obstetrics and Gynecology

## 2024-02-15 DIAGNOSIS — M8580 Other specified disorders of bone density and structure, unspecified site: Secondary | ICD-10-CM

## 2024-03-16 ENCOUNTER — Encounter: Payer: Self-pay | Admitting: Hematology

## 2024-03-26 ENCOUNTER — Ambulatory Visit
Admission: RE | Admit: 2024-03-26 | Discharge: 2024-03-26 | Disposition: A | Discharge status: Home / Self Care | Source: Ambulatory Visit | Attending: Hematology | Admitting: Hematology

## 2024-03-26 DIAGNOSIS — D0512 Intraductal carcinoma in situ of left breast: Secondary | ICD-10-CM

## 2024-03-26 MED ORDER — GADOPICLENOL 0.5 MMOL/ML IV SOLN
7.0000 mL | Freq: Once | INTRAVENOUS | Status: AC | PRN
Start: 2024-03-26 — End: 2024-03-26
  Administered 2024-03-26: 7 mL via INTRAVENOUS

## 2024-03-28 ENCOUNTER — Other Ambulatory Visit: Payer: Self-pay | Admitting: Hematology

## 2024-03-28 DIAGNOSIS — R928 Other abnormal and inconclusive findings on diagnostic imaging of breast: Secondary | ICD-10-CM

## 2024-03-29 ENCOUNTER — Ambulatory Visit: Payer: Self-pay | Admitting: Hematology

## 2024-04-06 ENCOUNTER — Ambulatory Visit
Admission: RE | Admit: 2024-04-06 | Discharge: 2024-04-06 | Disposition: A | Discharge status: Home / Self Care | Source: Ambulatory Visit | Attending: Hematology | Admitting: Hematology

## 2024-04-06 ENCOUNTER — Ambulatory Visit
Admission: RE | Admit: 2024-04-06 | Discharge: 2024-04-06 | Disposition: A | Source: Ambulatory Visit | Attending: Hematology

## 2024-04-06 DIAGNOSIS — R928 Other abnormal and inconclusive findings on diagnostic imaging of breast: Secondary | ICD-10-CM

## 2024-04-06 MED ORDER — GADOPICLENOL 0.5 MMOL/ML IV SOLN
7.5000 mL | Freq: Once | INTRAVENOUS | Status: AC | PRN
  Administered 2024-04-06: 7.5 mL via INTRAVENOUS

## 2024-04-07 LAB — SURGICAL PATHOLOGY

## 2024-04-08 ENCOUNTER — Telehealth: Payer: Self-pay | Admitting: Hematology

## 2024-04-08 ENCOUNTER — Other Ambulatory Visit: Payer: Self-pay | Admitting: *Deleted

## 2024-04-08 DIAGNOSIS — C50411 Malignant neoplasm of upper-outer quadrant of right female breast: Secondary | ICD-10-CM | POA: Insufficient documentation

## 2024-04-08 NOTE — Telephone Encounter (Signed)
 Tara Yates has been scheduled for her follow up appointment on 10/9 and she is aware of all appointment details.

## 2024-04-11 ENCOUNTER — Encounter: Payer: Self-pay | Admitting: Family Medicine

## 2024-04-11 ENCOUNTER — Ambulatory Visit: Attending: General Surgery

## 2024-04-11 ENCOUNTER — Other Ambulatory Visit: Payer: Self-pay

## 2024-04-11 DIAGNOSIS — C50411 Malignant neoplasm of upper-outer quadrant of right female breast: Secondary | ICD-10-CM | POA: Insufficient documentation

## 2024-04-11 DIAGNOSIS — R293 Abnormal posture: Secondary | ICD-10-CM | POA: Insufficient documentation

## 2024-04-11 DIAGNOSIS — D0512 Intraductal carcinoma in situ of left breast: Secondary | ICD-10-CM

## 2024-04-11 NOTE — Therapy (Signed)
 OUTPATIENT PHYSICAL THERAPY BREAST CANCER BASELINE EVALUATION   Patient Name: Tara Yates MRN: [id number] DOB:January 14, 1966, 58 y.o., female Today's Date: 04/11/2024  END OF SESSION:  PT End of Session - 04/11/24 1702     Visit Number 1    Number of Visits 2    Date for Recertification  06/06/24    PT Start Time 1602    PT Stop Time 1657    PT Time Calculation (min) 55 min    Activity Tolerance Patient tolerated treatment well    Behavior During Therapy [facility] for tasks assessed/performed          Past Medical History:  Diagnosis Date   Allergy    Anxiety    Back pain    Breast cancer (HCC)    Constipation    Depression    Ductal carcinoma in situ of left breast 11/09/2020   Family history of breast cancer 11/09/2020   HSV (herpes simplex virus) anogenital infection    Hyperlipidemia    Hypothyroidism    Joint pain    Plantar fasciitis, bilateral    SOBOE (shortness of breath on exertion)    Past Surgical History:  Procedure Laterality Date   BREAST BIOPSY Right 02/28/2015   BREAST BIOPSY Left 07/11/2021   BREAST EXCISIONAL BIOPSY Right 03/29/2015   had seed placement as wel   BREAST LUMPECTOMY WITH RADIOACTIVE SEED LOCALIZATION Left 12/18/2020   Procedure: LEFT BREAST LUMPECTOMY WITH RADIOACTIVE SEED LOCALIZATION;  Surgeon: [name], MD;  Location: [facility];  Service: General;  Laterality: Left;   CARPAL TUNNEL RELEASE Right 2022   ENDOMETRIAL ABLATION     MOUTH SURGERY     RADIOACTIVE SEED GUIDED EXCISIONAL BREAST BIOPSY Right 04/03/2015   Procedure: RADIOACTIVE SEED GUIDED EXCISIONAL BREAST BIOPSY;  Surgeon: Cough Ebbie, MD;  Location: [facility];  Service: General;  Laterality: Right;   RADIOACTIVE SEED GUIDED EXCISIONAL BREAST BIOPSY Left 02/06/2022   Procedure: LEFT BREAST SEED GUIDED EXCISIONAL BIOPSY;  Surgeon: [name], MD;  Location: [facility] OR;  Service: General;  Laterality: Left;  LMA   Patient Active  Problem List   Diagnosis Date Noted   Malignant neoplasm of upper-outer quadrant of right female breast (HCC) 04/08/2024   History of breast biopsy 01/04/2021   Genetic testing 11/16/2020   Ductal carcinoma in situ of left breast 11/09/2020   Family history of breast cancer 11/09/2020   At risk for dehydration 06/13/2020   Mood disorder (HCC), with emotional eating 05/29/2020   Other hyperlipidemia 05/29/2020   Insulin resistance 05/29/2020   Vitamin D  deficiency 05/29/2020   Hyperlipidemia 05/15/2020   Other fatigue 05/15/2020   SOBOE (shortness of breath on exertion) 05/15/2020   GAD (generalized anxiety disorder) 05/15/2020   History of constipation 05/15/2020   Depression 05/15/2020   At risk for heart disease 05/15/2020   Plantar fasciitis 01/04/2020   Hypocholesterolemia 08/24/2018   Palpitations 03/19/2018   Adjustment disorder with depressed mood 10/01/2016   Hot flashes 05/20/2016   Preventative health care 05/20/2016   Depressive disorder 04/14/2016   Radial scar of breast 03/15/2015   Shingles rash 02/14/2015   Arthralgia 09/14/2014   Bilateral low back pain 09/14/2014   Bloating 09/14/2014   Constipation 09/14/2014   Hypothyroidism 09/14/2014   Insomnia 09/14/2014    PCP:   REFERRING PROVIDER: Cough Ebbie, MD  REFERRING DIAG: Right Breast Cancer  THERAPY DIAG:  Malignant neoplasm of upper-outer quadrant of right female breast, unspecified estrogen receptor status (HCC)  Abnormal posture  Rationale for Evaluation and Treatment: Rehabilitation  ONSET DATE: 04/06/2024  SUBJECTIVE:                                                                                                                                                                                           SUBJECTIVE STATEMENT: Patient reports she is here today to be seen by her medical team for her newly diagnosed right breast cancer.   PERTINENT HISTORY:  Patient was diagnosed on  04/06/2024 with right grade 2 IDC. It is located in the upper-outer quadrant. It is ER 40%, PR -, HER 2 - with a Ki67 of 5%.  Pt had a prior left lumpectomy on 12/18/2020 for DCIS and did not have radiation, and an additional left lumpectomy for DCIS in 8/23. She completed radiation in 04/2022. She will met with Plastic surgeon on Wed  and Dr. Lanny on Thursday to determine what type of surgery she will have  PATIENT GOALS:   reduce lymphedema risk and learn post op HEP.   PAIN:  Are you having pain? No  PRECAUTIONS: Active CA   RED FLAGS: None   HAND DOMINANCE: right  WEIGHT BEARING RESTRICTIONS: No  FALLS:  Has patient fallen in last 6 months? No  LIVING ENVIRONMENT: Patient lives with: husband,(border collie)  dog and cat Lives in: House/apartment Has following equipment at home: public health, works from office.  OCCUPATION: Public Health mgr  LEISURE: pure barr, walk  PRIOR LEVEL OF FUNCTION: Independent   OBJECTIVE: Note: Objective measures were completed at Evaluation unless otherwise noted.  COGNITION: Overall cognitive status: Within functional limits for tasks assessed    POSTURE:  Forward head and rounded shoulders posture  UPPER EXTREMITY AROM/PROM:  A/PROM RIGHT   eval   Shoulder extension 59  Shoulder flexion 154  Shoulder abduction 179  Shoulder internal rotation 70  Shoulder external rotation 108    (Blank rows = not tested)  A/PROM LEFT   eval  Shoulder extension 50  Shoulder flexion 156  Shoulder abduction 180  Shoulder internal rotation 70  Shoulder external rotation 100    (Blank rows = not tested)  CERVICAL AROM: All within functional limits:     UPPER EXTREMITY STRENGTH: WNL  LYMPHEDEMA ASSESSMENTS (in cm):   LANDMARK RIGHT   eval  10 cm proximal to olecranon process 29.6  Olecranon process 25.2  10 cm proximal to ulnar styloid process 21.2  Just proximal to ulnar styloid process 15.7  Across hand at thumb web space 18.55   At base of 2nd digit 5.9  (Blank rows = not tested)  LANDMARK LEFT  eval  10 cm proximal to olecranon process 29.2  Olecranon process 25  10 cm proximal to ulnar styloid process 21.0  Just proximal to ulnar styloid process 15.35  Across hand at thumb web space 17.7  At base of 2nd digit 5.9  (Blank rows = not tested)  L-DEX LYMPHEDEMA SCREENING:  The patient was assessed using the L-Dex machine today to produce a lymphedema index baseline score. The patient will be reassessed on a regular basis (typically every 3 months) to obtain new L-Dex scores. If the score is > 6.5 points away from his/her baseline score indicating onset of subclinical lymphedema, it will be recommended to wear a compression garment for 4 weeks, 12 hours per day and then be reassessed. If the score continues to be > 6.5 points from baseline at reassessment, we will initiate lymphedema treatment. Assessing in this manner has a 95% rate of preventing clinically significant lymphedema.   L-DEX FLOWSHEETS - 04/11/24 1600       L-DEX LYMPHEDEMA SCREENING   Measurement Type Unilateral    L-DEX MEASUREMENT EXTREMITY Upper Extremity    POSITION  Standing    DOMINANT SIDE Right    At Risk Side Right    BASELINE SCORE (UNILATERAL) 2.9          QUICK DASH SURVEY: 0%  PATIENT EDUCATION:  Education details: Time spent educating patient on aspects of self-care to maximize post op recovery. Patient was educated on where and how to get a post op compression bra to use to reduce post op edema. Patient was also educated on the use of SOZO screenings and surveillance principles for early identification of lymphedema onset. She was instructed to use the post op pillow in the axilla for pressure and pain relief. Patient educated on lymphedema risk reduction and post op shoulder/posture HEP. Person educated: Patient Education method: Explanation, Demonstration, Handout Education comprehension: Patient verbalized  understanding and returned demonstration  HOME EXERCISE PROGRAM: Patient was instructed today in a home exercise program today for post op shoulder range of motion. These included active assist shoulder flexion in sitting, scapular retraction, wall walking with shoulder abduction, and hands behind head external rotation.  She was encouraged to do these twice a day, holding 3 seconds and repeating 5 times when permitted by her physician.   ASSESSMENT:  CLINICAL IMPRESSION: Pts. multidisciplinary medical team met prior to her assessments to determine a recommended treatment plan. She not yet sure which type of surgery she will have, but is considering a Bilateral Mastectomy with DIEP Flap and will meet with plastic surgeon tomorrow and then Dr. Lanny, before seeing Dr. Ebbie again.SABRA She will benefit from a post op PT reassessment to determine needs and from L-Dex screens every 3 months for 2 years to detect subclinical lymphedema.  Pt will benefit from skilled therapeutic intervention to improve on the following deficits: Decreased knowledge of precautions, impaired UE functional use, pain, decreased ROM, postural dysfunction.   PT treatment/interventions: ADL/self-care home management, pt/family education, therapeutic exercise  REHAB POTENTIAL: Good  CLINICAL DECISION MAKING: Stable/uncomplicated  EVALUATION COMPLEXITY: Low   GOALS: Goals reviewed with patient? YES  LONG TERM GOALS: (STG=LTG)    Name Target Date Goal status  1 Pt will be able to verbalize understanding of pertinent lymphedema risk reduction practices relevant to her dx specifically related to skin care.  Baseline:  No knowledge 04/11/2024 Achieved at eval  2 Pt will be able to return demo and/or verbalize understanding of the post op HEP related  to regaining shoulder ROM. Baseline:  No knowledge 04/11/2024 Achieved at eval  3 Pt will be able to verbalize understanding of the importance of viewing the post op After  Breast CA Class video for further lymphedema risk reduction education and therapeutic exercise.  Baseline:  No knowledge 04/11/2024 Achieved at eval  4 Pt will demo she has regained full shoulder ROM and function post operatively compared to baselines.  Baseline: See objective measurements taken today. 06/06/2024     PLAN:  PT FREQUENCY/DURATION: EVAL and 1 follow up appointment.   PLAN FOR NEXT SESSION: will reassess 3-4 weeks post op to determine needs.   Patient will follow up at outpatient cancer rehab 3-4 weeks following surgery.  If the patient requires physical therapy at that time, a specific plan will be dictated and sent to the referring physician for approval. The patient was educated today on appropriate basic range of motion exercises to begin post operatively and the importance of viewing the After Breast Cancer class video following surgery.  Patient was educated today on lymphedema risk reduction practices as it pertains to recommendations that will benefit the patient immediately following surgery.  She verbalized good understanding.    Physical Therapy Information for After Breast Cancer Surgery/Treatment:  Lymphedema is a swelling condition that you may be at risk for in your arm if you have lymph nodes removed from the armpit area.  After a sentinel node biopsy, the risk is approximately 5-9% and is higher after an axillary node dissection.  There is treatment available for this condition and it is not life-threatening.  Contact your physician or physical therapist with concerns. You may begin the 4 shoulder/posture exercises (see additional sheet) when permitted by your physician (typically a week after surgery).  If you have drains, you may need to wait until those are removed before beginning range of motion exercises.  A general recommendation is to not lift your arms above shoulder height until drains are removed.  These exercises should be done to your tolerance and gently.   This is not a no pain/no gain type of recovery so listen to your body and stretch into the range of motion that you can tolerate, stopping if you have pain.  If you are having immediate reconstruction, ask your plastic surgeon about doing exercises as he or she may want you to wait. We encourage you to view the After Breast Cancer class video following surgery.  You will learn information related to lymphedema risk, prevention and treatment and additional exercises to regain mobility following surgery.   While undergoing any medical procedure or treatment, try to avoid blood pressure being taken or needle sticks from occurring on the arm on the side of cancer.   This recommendation begins after surgery and continues for the rest of your life.  This may help reduce your risk of getting lymphedema (swelling in your arm). An excellent resource for those seeking information on lymphedema is the National Lymphedema Network's web site. It can be accessed at www.lymphnet.org If you notice swelling in your hand, arm or breast at any time following surgery (even if it is many years from now), please contact your doctor or physical therapist to discuss this.  Lymphedema can be treated at any time but it is easier for you if it is treated early on.  If you feel like your shoulder motion is not returning to normal in a reasonable amount of time, please contact your surgeon or physical therapist.  [facility]  [facility] [phone]. [address], [address], [place] [address]  [facility] CLASS After Breast Cancer Class  After Breast Cancer Class is a specially designed exercise class video to assist you in a safe recover after having breast cancer surgery.  In this video you will learn how to get back to full function whether your drains were just removed or if you had surgery a month ago. The video can be viewed on this page:  [email or web address].boyd-meyer.org/ or on YouTube here: https://youtu.[email or web address].  Class Goals  Understand specific stretches to improve the flexibility of you chest and shoulder. Learn ways to safely strengthen your upper body and improve your posture. Understand the warning signs of infection and why you may be at risk for an arm infection. Learn about Lymphedema and prevention.  ** You do not need to view this video until after surgery.  Drains should be removed to participate in the recommended exercises on the video.  Patient was instructed today in a home exercise program today for post op shoulder range of motion. These included active assist shoulder flexion in sitting, scapular retraction, wall walking with shoulder abduction, and hands behind head external rotation.  She was encouraged to do these twice a day, holding 3 seconds and repeating 5 times when permitted by her physician.    Grayce JINNY Sheldon, PT 04/11/2024, 5:03 PM

## 2024-04-12 NOTE — Telephone Encounter (Signed)
 Called and spoke with patient, she is aware that Dr. Mercer is OOO today,

## 2024-04-12 NOTE — Telephone Encounter (Unsigned)
 Copied from CRM [id number]. Topic: Referral - Status >> Apr 12, 2024 10:54 AM Aleatha BROCKS wrote: Reason for CRM: Patient called in to see if Dr banks have seen her request for a referral for  double mastectomy with DIEP Flap Reconstruction at [facility] please  check out her message via mychart

## 2024-04-13 ENCOUNTER — Ambulatory Visit (INDEPENDENT_AMBULATORY_CARE_PROVIDER_SITE_OTHER)

## 2024-04-13 VITALS — BP 133/85 | HR 72 | Ht 64.0 in | Wt 174.0 lb

## 2024-04-13 DIAGNOSIS — Z803 Family history of malignant neoplasm of breast: Secondary | ICD-10-CM

## 2024-04-13 DIAGNOSIS — C50411 Malignant neoplasm of upper-outer quadrant of right female breast: Secondary | ICD-10-CM | POA: Diagnosis not present

## 2024-04-13 DIAGNOSIS — Z9889 Other specified postprocedural states: Secondary | ICD-10-CM

## 2024-04-13 DIAGNOSIS — Z923 Personal history of irradiation: Secondary | ICD-10-CM

## 2024-04-13 DIAGNOSIS — Z1379 Encounter for other screening for genetic and chromosomal anomalies: Secondary | ICD-10-CM

## 2024-04-13 NOTE — Addendum Note (Signed)
 Addended by: BRIEN SONG A on: 04/13/2024 01:56 PM   Modules accepted: Orders

## 2024-04-13 NOTE — Progress Notes (Addendum)
 Plastic & Reconstructive Surgery New Patient Visit  Patient: Tara Yates MRN: [id number] Date: 04/13/2024 Surgical Oncologist: Medical Oncologist:  Reason for Consult: Breast Reconstruction   History of Present Illness:  This is a 58 y.o. woman who presents in consultation for breast reconstruction.   Her breast history is as follows:  Oncology History  Ductal carcinoma in situ of left breast  10/16/2020 Pathology Results   Breast, left, needle core biopsy, UOQ posterior - DUCTAL CARCINOMA IN SITU WITH CALCIFICATIONS, INTERMEDIATE NUCLEAR GRADE  PROGNOSTIC INDICATORS Results: IMMUNOHISTOCHEMICAL AND MORPHOMETRIC ANALYSIS PERFORMED MANUALLY Estrogen Receptor: 95%, POSITIVE, STRONG STAINING INTENSITY Progesterone Receptor: 20%, POSITIVE, STRONG STAINING INTENSITY   11/09/2020 Initial Diagnosis   Ductal carcinoma in situ of left breast   11/16/2020 Genetic Testing   Negative hereditary cancer genetic testing: no pathogenic variants detected in [facility] Panel.  The report date is Nov 16, 2020. The BRCAplus panel offered by [employer] and includes sequencing and deletion/duplication analysis for the following 8 genes: ATM, BRCA1, BRCA2, CDH1, CHEK2, PALB2, PTEN, and TP53.    Negative hereditary cancer genetic testing: no pathogenic variants detected in [facility] CustomNext-Cancer +RNAinsight Panel.  Variant of uncertain significance detected in CDKN2A at c.440C>T (p.A147V). The report date is Nov 26, 2020.   The CustomNext-Cancer+RNAinsight panel offered by [employer] includes sequencing and rearrangement analysis for the following 47 genes:  APC, ATM, AXIN2, BARD1, BMPR1A, BRCA1, BRCA2, BRIP1, CDH1, CDK4, CDKN2A, CHEK2, DICER1, EPCAM, GREM1, HOXB13, MEN1, MLH1, MSH2, MSH3, MSH6, MUTYH, NBN, NF1, NF2, NTHL1, PALB2, PMS2, POLD1, POLE, PTEN, RAD51C, RAD51D, RECQL, RET, SDHA, SDHAF2, SDHB, SDHC, SDHD, SMAD4, SMARCA4, STK11, TP53, TSC1, TSC2, and VHL.  RNA data is routinely analyzed  for use in variant interpretation for all genes.    12/18/2020 Surgery   Left Lumpectomy  FINAL MICROSCOPIC DIAGNOSIS:   A. BREAST, LEFT, LUMPECTOMY:  - Ductal carcinoma in situ, intermediate grade, involving areas of  sclerosing adenosis, see comment  - Resection margins are negative for DCIS; closest is the posterior  margin at 3 mm  - Biopsy site changes  - Calcifications  - See oncology table   B. BREAST, LEFT ADDITIONAL LATERAL MARGIN, EXCISION:  - Fibrocystic change with sclerosing adenosis and calcifications  - Negative for carcinoma   C. BREAST, LEFT ADDITIONAL POSTERIOR MARGIN, EXCISION:  - Benign breast parenchyma, negative for carcinoma    12/18/2020 Cancer Staging   Staging form: Breast, AJCC 8th Edition - Pathologic stage from 12/18/2020: Stage Unknown (pTis (DCIS), pNX, cM0, G2, ER+, PR+, HER2: Not Assessed) - Signed by [name], NP on 06/27/2021 Stage prefix: Initial diagnosis Histologic grading system: 3 grade system   06/27/2021 Survivorship   SCP delivered by Lacie Burton, NP   07/2021 -  Anti-estrogen oral therapy   Pending start of chemoprevention with tamoxifen  in 07/2021    12/19/2021 Imaging   IMPRESSION: Breast MRI impression:  1. Area of non mass enhancement in the left breast has increased in size from the prior MRI. It lies just below cylinder shaped biopsy clip. Given the position of the post biopsy marker clip and the interval increase in size of this enhancement, re-biopsy is recommended. 2. No other areas of abnormal enhancement in the left breast. 3. No evidence of right breast malignancy.   RECOMMENDATION: 1. MRI guided core needle biopsy of the area of non mass enhancement in the left breast.   12/27/2021 Pathology Results   Diagnosis Breast, left, needle core biopsy, upper inner quadrant, cylinder clip -  LOBULAR NEOPLASIA (ATYPICAL LOBULAR HYPERPLASIA). - FIBROCYSTIC CHANGES WITH USUAL DUCTAL HYPERPLASIA AND CALCIFICATIONS. - SEE  NOTE.   02/06/2022 Surgery   Left breast lumpectomy by Dr. [name]   02/06/2022 Pathology Results   FINAL MICROSCOPIC DIAGNOSIS: A. BREAST, LEFT, LUMPECTOMY: Ductal carcinoma in-situ, apocrine type with clear margins of resection. Radial sclerosing lesion with extensive atypical lobular hyperplasia in the immediate vicinity and elsewhere in the breast. Multiple foci of sclerosing adenosis. Invasive carcinoma is not identified.  Estrogen receptors and progesterone receptors both negative   03/18/2022 -  Radiation Therapy   Adjuvant radiation by Dr. Dewey, plan to complete 04/15/2022    Due to her elevated risk profile, the patient recently underwent a breast MRI, which revealed C-density breasts. The right breast appeared normal, with no abnormal lymphadenopathy identified. In the left breast, however, there was suspicious linear non-mass enhancement (NME) located in the upper outer quadrant, measuring approximately 1.6 x 0.6 x 1.1 cm. Biopsy of this lesion revealed lobular neoplasia involving a small sclerotic lesion.  A subsequent MRI demonstrated an increase in the size of this lesion to 2.1 x 1.3 cm. A repeat biopsy was performed, confirming the presence of atypical lobular hyperplasia [facility]). Since January, the patient has been on tamoxifen  5 mg daily. I performed a seed-guided excision of the lesion, which pathology identified as grade II ER/PR-negative ductal carcinoma in situ (DCIS) with clear surgical margins. The patient then completed adjuvant radiotherapy and continues on tamoxifen  10 mg every other day.  Follow-up MRI revealed persistent C-density breasts, with linear NME measuring up to 3.5 cm in the upper inner quadrant (UIQ) of the left breast, consistent with prior findings. Biopsy of this area was benign and concordant with imaging, with a recommendation for six-month interval surveillance imaging.  Of concern, two adjacent areas of NME were identified in the upper outer  quadrant (UOQ) of the right breast. Biopsy confirmed grade II invasive ductal carcinoma (IDC).   She recently met with Dr. Bury where they discussed discussed the options for treatment of the breast cancer which included discussed the options for treatment of the breast cancer which included lumpectomy versus a mastectomy. She has not decided how to proceed yet.   She currently wears a 38 C cup bra and would ideally like to be a A/B cup.    Past Medical History: Past Medical History:  Diagnosis Date   Allergy    Anxiety    Back pain    Breast cancer (HCC)    Constipation    Depression    Ductal carcinoma in situ of left breast 11/09/2020   Family history of breast cancer 11/09/2020   HSV (herpes simplex virus) anogenital infection    Hyperlipidemia    Hypothyroidism    Joint pain    Plantar fasciitis, bilateral    SOBOE (shortness of breath on exertion)     Past Surgical History: Past Surgical History:  Procedure Laterality Date   BREAST BIOPSY Right 02/28/2015   BREAST BIOPSY Left 07/11/2021   BREAST EXCISIONAL BIOPSY Right 03/29/2015   had seed placement as wel   BREAST LUMPECTOMY WITH RADIOACTIVE SEED LOCALIZATION Left 12/18/2020   Procedure: LEFT BREAST LUMPECTOMY WITH RADIOACTIVE SEED LOCALIZATION;  Surgeon: Bury Cough, MD;  Location: [facility];  Service: General;  Laterality: Left;   CARPAL TUNNEL RELEASE Right 2022   ENDOMETRIAL ABLATION     MOUTH SURGERY     RADIOACTIVE SEED GUIDED EXCISIONAL BREAST BIOPSY Right 04/03/2015   Procedure: RADIOACTIVE  SEED GUIDED EXCISIONAL BREAST BIOPSY;  Surgeon: Donnice Bury, MD;  Location: [facility];  Service: General;  Laterality: Right;   RADIOACTIVE SEED GUIDED EXCISIONAL BREAST BIOPSY Left 02/06/2022   Procedure: LEFT BREAST SEED GUIDED EXCISIONAL BIOPSY;  Surgeon: Bury Donnice, MD;  Location: [facility];  Service: General;  Laterality: Left;  LMA    Current Medications: Current  Outpatient Medications on File Prior to Visit  Medication Sig Dispense Refill   BIOTIN MAXIMUM PO Take by mouth.     Calcium Carb-Cholecalciferol (CALCIUM 600 + D PO) Take by mouth.     cholecalciferol (VITAMIN D3) 25 MCG (1000 UNIT) tablet Take 1,000 Units by mouth daily.     EC-RX TESTOSTERONE TD 2 %.     levothyroxine  (SYNTHROID ) 25 MCG tablet TAKE 1 TABLET BY MOUTH EVERY DAY BEFORE BREAKFAST 30 tablet 3   Multiple Vitamin (MULTI-VITAMIN) tablet Take by mouth.     Nutritional Supplements (JUICE PLUS FIBRE PO)      Probiotic Product (ALIGN PO) Take by mouth.     tamoxifen  (NOLVADEX ) 10 MG tablet TAKE 1 TABLET BY MOUTH EVERY OTHER DAY 15 tablet 5   No current facility-administered medications on file prior to visit.    Allergies: No Known Allergies  Family History:  No family history is negative for bleeding/clotting disorders, problems with anesthesia, connective tissue disorders.   Social History:  Social History   Socioeconomic History   Marital status: Divorced    Spouse name: Not on file   Number of children: 2   Years of education: Not on file   Highest education level: Not on file  Occupational History   Not on file  Tobacco Use   Smoking status: Former    Current packs/day: 0.00    Average packs/day: 1 pack/day for 29.0 years (29.0 ttl pk-yrs)    Types: Cigarettes    Start date: 31    Quit date: 2000    Years since quitting: 25.7   Smokeless tobacco: Never   Tobacco comments:    Quit 20 years ago  Vaping Use   Vaping status: Never Used  Substance and Sexual Activity   Alcohol use: Yes    Alcohol/week: 7.0 - 14.0 standard drinks of alcohol    Types: 7 - 14 Glasses of wine per week    Comment: 1-2 glasses of wine a day, maybe a beer   Drug use: Never   Sexual activity: Not Currently    Birth control/protection: Post-menopausal    Comment: ablation  Other Topics Concern   Not on file  Social History Narrative   Not on file   Social Drivers of Health    Financial Resource Strain: Not on file  Food Insecurity: Not on file  Transportation Needs: Not on file  Physical Activity: Not on file  Stress: Not on file  Social Connections: Not on file   She is a former smoker. Denies recreational drug use.  Review of systems: 10 point review of systems performed and negative except as noted in the HPI.  Physical Exam: BP 133/85   Pulse 72   Ht 5' 4 (1.626 m)   Wt 174 lb (78.9 kg)   LMP 07/07/2018   SpO2 98%   BMI 29.87 kg/m  Body mass index is 29.87 kg/m. General: Well appearing, no apparent distress. Pulm: Breathing comfortably on room air without sounds/wheezing. CV: Regular rate. Good perfusion of extremities. Chest: Chest wall without abnormality or obvious deformity or asymmetry.  Breast: Bilateral  grade 2 ptosis. Breasts are overall symmetric with regards to shape and contour. right breast appears larger than left. Bilateral NAC viable and sensate. Papules everted without discharge. NACs positioned along breast meridian bilaterally. Skin quality is good on the right and less optimal on the left. There are no skin lesions or striae, biopsy site present on the left. There are prior incisions on both breast.  - Breast measurements:  Measurements  Right Breast (cm)  Left Breast (cm)   Base Width 16.5 15.5  SN - Nipple 26 27  Nipple - IMF 9 9  NAC diameter 4*3.5 3.5x4  Internipple Distance 25  Abdomen: Soft, nondistended, nontender to palpation. No palpable umbilical hernia. 4 cm of diastasis appreciated. There are no prior incisions. Skin quality is poor. There is sufficient skin and subcutaneous tissue for bilateral reconstruction of breast to about A-B cup.  Neuro: Moving all four extremities spontaneously.  Psych: Appropriate mood and affect.   Labs: Recent Results (from the past 2160 hours)  VITAMIN D  25 Hydroxy (Vit-D Deficiency, Fractures)     Status: None   Collection Time: 01/29/24  2:28 PM  Result Value Ref Range   Vit  D, 25-Hydroxy 61 30 - 100 ng/mL    Comment: Vitamin D  Status         25-OH Vitamin D : . Deficiency:                    <20 ng/mL Insufficiency:             20 - 29 ng/mL Optimal:                 > or = 30 ng/mL . For 25-OH Vitamin D  testing on patients on  D2-supplementation and patients for whom quantitation  of D2 and D3 fractions is required, the Quest[facility]ureD(TM) 25-OH VIT D, (D2,D3), LC/MS/MS is recommended: order  code 07111 (patients >8yrs). . See Note 1 . Note 1 . For additional information, please refer to  http://education.[facility].com/faq/FAQ199  (This link is being provided for informational/ educational purposes only.)   Comprehensive metabolic panel with GFR     Status: Abnormal   Collection Time: 01/29/24  2:28 PM  Result Value Ref Range   Glucose, Bld 87 65 - 99 mg/dL    Comment: .            Fasting reference interval .    BUN 12 7 - 25 mg/dL   Creat 9.38 9.49 - 8.96 mg/dL   eGFR 893 > OR = 60 fO/fpw/8.26f7   BUN/Creatinine Ratio SEE NOTE: 6 - 22 (calc)    Comment:    Not Reported: BUN and Creatinine are within    reference range. .    Sodium 139 135 - 146 mmol/L   Potassium 4.0 3.5 - 5.3 mmol/L   Chloride 102 98 - 110 mmol/L   CO2 27 20 - 32 mmol/L   Calcium 9.4 8.6 - 10.4 mg/dL   Total Protein 6.9 6.1 - 8.1 g/dL   Albumin  4.8 3.6 - 5.1 g/dL   Globulin 2.1 1.9 - 3.7 g/dL (calc)   AG Ratio 2.3 1.0 - 2.5 (calc)   Total Bilirubin 0.5 0.2 - 1.2 mg/dL   Alkaline phosphatase (APISO) 51 37 - 153 U/L   AST 23 10 - 35 U/L   ALT 35 (H) 6 - 29 U/L  Lipid panel     Status: Abnormal   Collection Time: 01/29/24  2:28 PM  Result Value Ref  Range   Cholesterol 203 (H) <200 mg/dL   HDL 60 > OR = 50 mg/dL   Triglycerides 860 <849 mg/dL   LDL Cholesterol (Calc) 118 (H) mg/dL (calc)    Comment: Reference range: <100 . Desirable range <100 mg/dL for primary prevention;   <70 mg/dL for patients with CHD or diabetic patients  with > or = 2 CHD risk  factors. SABRA LDL-C is now calculated using the Martin-Hopkins  calculation, which is a validated novel method providing  better accuracy than the Friedewald equation in the  estimation of LDL-C.  Gladis APPLETHWAITE et al. SANDREA. 7986;689(80): 2061-2068  (http://education.[facility].com/faq/FAQ164)    Total CHOL/HDL Ratio 3.4 <5.0 (calc)   Non-HDL Cholesterol (Calc) 143 (H) <130 mg/dL (calc)    Comment: For patients with diabetes plus 1 major ASCVD risk  factor, treating to a non-HDL-C goal of <100 mg/dL  (LDL-C of <29 mg/dL) is considered a therapeutic  option.   Hemoglobin A1c     Status: None   Collection Time: 01/29/24  2:28 PM  Result Value Ref Range   Hgb A1c MFr Bld 5.1 <5.7 %    Comment: For the purpose of screening for the presence of diabetes: . <5.7%       Consistent with the absence of diabetes 5.7-6.4%    Consistent with increased risk for diabetes             (prediabetes) > or =6.5%  Consistent with diabetes . This assay result is consistent with a decreased risk of diabetes. . Currently, no consensus exists regarding use of hemoglobin A1c for diagnosis of diabetes in children. . According to American Diabetes Association (ADA) guidelines, hemoglobin A1c <7.0% represents optimal control in non-pregnant diabetic patients. Different metrics may apply to specific patient populations.  Standards of Medical Care in Diabetes(ADA). .    Mean Plasma Glucose 100 mg/dL   eAG (mmol/L) 5.5 mmol/L  TSH     Status: None   Collection Time: 01/29/24  2:28 PM  Result Value Ref Range   TSH 2.66 mIU/L    Comment:           Reference Range .           > or = 20 Years  0.40-4.50 .                Pregnancy Ranges           First trimester    0.26-2.66           Second trimester   0.55-2.73           Third trimester    0.43-2.91   CBC with Differential/Platelet     Status: None   Collection Time: 01/29/24  2:28 PM  Result Value Ref Range   WBC 4.6 3.8 - 10.8 Thousand/uL    RBC 4.20 3.80 - 5.10 Million/uL   Hemoglobin 13.3 11.7 - 15.5 g/dL   HCT 59.4 64.9 - 54.9 %   MCV 96.4 80.0 - 100.0 fL   MCH 31.7 27.0 - 33.0 pg   MCHC 32.8 32.0 - 36.0 g/dL    Comment: For adults, a slight decrease in the calculated MCHC value (in the range of 30 to 32 g/dL) is most likely not clinically significant; however, it should be interpreted with caution in correlation with other red cell parameters and the patient's clinical condition.    RDW 13.0 11.0 - 15.0 %   Platelets 201 140 - 400 Thousand/uL   MPV  9.4 7.5 - 12.5 fL   Neutro Abs 2,696 1,500 - 7,800 cells/uL   Absolute Lymphocytes 1,371 850 - 3,900 cells/uL   Absolute Monocytes 432 200 - 950 cells/uL   Eosinophils Absolute 60 15 - 500 cells/uL   Basophils Absolute 41 0 - 200 cells/uL   Neutrophils Relative % 58.6 %   Total Lymphocyte 29.8 %   Monocytes Relative 9.4 %   Eosinophils Relative 1.3 %   Basophils Relative 0.9 %  Surgical pathology     Status: None   Collection Time: 04/06/24 12:00 AM  Result Value Ref Range   SURGICAL PATHOLOGY      SURGICAL PATHOLOGY [facility] [address] [place], [place] [address] Telephone [phone] or [phone] Fax [phone]  REPORT OF SURGICAL PATHOLOGY   Accession #: [id number] Patient Name: ALEXINA, NICCOLI Visit # :   MRN: [id number] Physician: Ted Faden DOB/Age July 06, 1966 (Age: 89) Gender: F Collected Date: 04/06/2024 Received Date: 04/06/2024  FINAL DIAGNOSIS       1. Breast, right, needle core biopsy, upper slight outer posterior, cylinder clip :       - INVASIVE DUCTAL CARCINOMA, SEE NOTE      - TUBULE FORMATION: SCORE 2      - NUCLEAR PLEOMORPHISM: SCORE 3      - MITOTIC COUNT: SCORE 1      - TOTAL SCORE: 6      - OVERALL GRADE: 2      - LYMPHOVASCULAR INVASION: NOT IDENTIFIED      - CANCER LENGTH: 0.5 CM      - CALCIFICATIONS: PRESENT, WITH BENIGN BREAST PARENCHYMA      - OTHER FINDINGS: NONE        2. Breast, left, needle core biopsy, upper central anterior, cylinder clip :       - MILD FIBROCYSTIC CHA NGE WITH CALCIFICATIONS      - NEGATIVE FOR CARCINOMA       Diagnosis Note : Dr.LeGolvan reviewed the case and concurs with the above      diagnosis.A breast prognostic profile (ER, PR, Ki-67 and HER2) is pending and      will be reported in an addendum.[facility] was      notified on 04/07/2024.      ELECTRONIC SIGNATURE : Rebbecca Md, Retail Buyer, Sports Administrator, International Aid/development Worker  MICROSCOPIC DESCRIPTION  CASE COMMENTS STAINS USED IN DIAGNOSIS: H&E-2 H&E-3 H&E-4 H&E H&E-2 H&E-3 H&E-4 H&E H&E-2 H&E-3 H&E-4 H&E *RECUT 1 SLIDE H&E-2 H&E-3 H&E-4 H&E H&E-2 H&E-3 H&E-4 H&E Stains used in diagnosis 1 Her2 by IHC, 1 ER-ACIS, 1 KI-67-ACIS, 1 PR-ACIS IHC scores are reported using ASCO/CAP scoring criteria.  An IHC Score of 0 or 1+  is NEGATIVE for HER2, 3+ is POSITIVE for HER2, and 2+ is EQUIVOCAL. Equivocal results are reflexed to either FISH or IHC testing. Specimens are fixed in 10% Neutral Buffered Formalin for at least 6 h ours and up to 72 hours. These tests have not be validated on decalcified tissue.  Results should be interpreted with caution given the possibility of false negative results on decalcified specimens. Antibody Clone for HER2 is 4B5 ([facility]WAY). Some of these immunohistochemical stains may have been developed and the performance characteristics determined by [facility].  Some may not have been cleared or approved by the U.S. Food and Drug Administration.  The FDA has determined that such clearance or approval is not necessary.  This test is used for  clinical purposes.  It should not be regarded as investigational or for research.  This laboratory is certified under the Clinical Laboratory Improvement Amendments of 1988 (CLIA-88) as qualified to perform high complexity clinical laboratory testing. Estrogen  receptor (6F11), immunohistochemical stains are performed on formalin fixed, paraffin embedded tissue using a 3,3-diaminobenzidine (DAB) chromogen and Leica B ond Autostainer System.  The staining intensity of the nucleus is scored manually and is reported as the percentage of tumor cell nuclei demonstrating specific nuclear staining.Specimens are fixed in 10% Neutral Buffered Formalin for at least 6 hours and up to 72 hours.  These tests have not be validated on decalcified tissue.  Results should be interpreted with caution given the possibility of false negative results on decalcified specimens. Ki-67 (MM1), immunohistochemical stains are performed on formalin fixed, paraffin embedded tissue using a 3,3-diaminobenzidine (DAB) chromogen and Leica Bond Autostainer System.  The staining intensity of the nucleus is scored manually and is reported as the percentage of tumor cell nuclei demonstrating specific nuclear staining.Specimens are fixed in 10% Neutral Buffered Formalin for at least 6 hours and up to 72 hours. These tests have not be validated on decalcified tissue.  Results should be interpreted with caution given the pos sibility of false negative results on decalcified specimens. PR progesterone receptor (16), immunohistochemical stains are performed on formalin fixed, paraffin embedded tissue using a 3,3-diaminobenzidine (DAB) chromogen and Leica Bond Autostainer System.  The staining intensity of the nucleus is scored manually and is reported as the percentage of tumor cell nuclei demonstrating specific nuclear staining.Specimens are fixed in 10% Neutral Buffered Formalin for at least 6 hours and up to 72 hours. These tests have not be validated on decalcified tissue.  Results should be interpreted with caution given the possibility of false negative results on decalcified specimens.  ADDENDUM Breast, right, needle core biopsy PROGNOSTIC  INDICATORS Results: IMMUNOHISTOCHEMICAL AND MORPHOMETRIC ANALYSIS PERFORMED MANUALLY The tumor cells are negative for Her2 (0). Estrogen Receptor:  40%, positive, weak to moderate staining intensity Progesterone Receptor:  0%, negative Proliferat ion Marker Ki67: 5% COMMENT:  The negative hormone receptor study(ies) in this case has an internal positive control.  REFERENCE RANGE ESTROGEN RECEPTOR NEGATIVE     0% POSITIVE       =>1% REFERENCE RANGE PROGESTERONE RECEPTOR NEGATIVE     0% POSITIVE        =>1% All controls stained appropriately Pepper Dutton Md, Pathologist, Electronic Signature ( Signed [phone] 2025)   CLINICAL HISTORY  SPECIMEN(S) OBTAINED 1. Breast, right, needle core biopsy, Upper Slight Outer Posterior, Cylinder Clip 2. Breast, left, needle core biopsy, Upper Central Anterior, Cylinder Clip  SPECIMEN COMMENTS: 1. TIF: 8:30 AM, CIT < 3 minutes; 58 year old female with prior left DCIS x 2 (most recent Aug 2023 lumpectomy); she is here for non-mass enhancement in both breasts 2. TIF: 8:40 AM, CIT < 3 minutes SPECIMEN CLINICAL INFORMATION:    Gross Description 1. Received in formalin labeled with the patient's name and right upper, and consists of a 3.0 x 3.0 x 0.4 cm aggregate and tan yel low fibroadipose tissue.The specimen is entirely submitted in three cassettes.      TIF 8:30 a.m. on 04/06/24.  CIT less than 3 mins 2. Received in formalin labeled with the patient's name and left upper central, and consists of a 2.5 x 2.0 x 0.3 cm aggregate of tan yellow fibroadipose tissue.The specimen is entirely submitted in two cassettes.      TIF 8:40 a.m.  on 04/06/24.  CIT less than 3 mins.  (KL:gt, 04/06/24)        Report signed out from the following location(s) [facility] [address] [place], [place] [address] CLIA #: [id number]  [facility] [address] [place], [place] [address] CLIA #: [id number]      Imaging/Pathology:  Assessment: In summary, this is a pleasant 58 y.o. year-old woman with PMH and PSH as described above and newly diagnosed breast cancer presenting for consultation for breast reconstruction in anticipation of included lumpectomy versus bilateral mastectomies.  I had a long discussion with the patient regarding her options for breast reconstruction. All this information discussed is included in a patient education document given to the patient.   The patient was advised that timing of reconstruction can be either immediate (at the time of mastectomy), or delayed (at a separate operation after the mastectomy), and there are advantages and disadvantages to both, depending upon the need for additional cancer treatment. We discussed that breast reconstruction has limitations. We discussed that a reconstruction can have shape issues, be painful and is largely insensate. It is never the same as a natural breast. The general surgical risks discussed included wound infections, bleeding, scarring, chronic pain, fluid build up (seroma), and wound breakdown needing wound care, sometimes even a wound vac. Death can occur with any surgery. Sometimes a blood clot (DVT) can develop that may travel to the lungs (PE) and can be fatal. The mastectomy can have skin loss not infrequently and this can have negative implications for our reconstruction results both aesthetically and complications wise, especially increasing the risk of implant removal and the need for skin removal leading to poor aesthetics. Sometimes chronic pain syndromes can ensue that are difficult to manage. Aesthetic outcomes were discussed in detail and the patient understands that asymmetries are common, aesthetic disappointment is possible and that revision rates are very high after all types of reconstruction to try get the result as best as possible. The patient was told that there are limitations of reconstruction, limited  sometimes by the patient's characteristics and how they heal and that outcomes sometimes are unpredictable.  1. Implant based reconstruction:  The usual process of placing an expander and postoperative care was discussed. We also discussed the option of DTI (direct to implant) reconstruction. We discussed the potential use of graft material and the potential of a slight increase in infection. We contrasted this to the benefits of its use also. We compared and contrasted the different implants and the FDA concerns and recommendations. Specifically the patient understands that silicone implants are not implicated in autoimmune diseases but that there is a potential risk of a condition called ALCL, a lymphoma type condition that occurs particularly with the use of textures implants. The FDA and [employer] has not changed their stance on implants. Surgical risks besides the risks above, that were discussed, included the following, infection leading to possible loss of the expander/implant, chronic pain, aesthetic complications such as rippling, asymmetry and animation (unwanted jumping or squashing of the implant when the chest muscle contracts). We also discussed the common complication of capsular contracture that often will need revisional surgery and can be painful. We quoted national data from our society of 40% revision rates at 7 years and that revisions in other two stage or DTI reconstruction was common. We discussed pre-pectoral reconstruction, placing the devices in front of the muscle, and that this is fairly new. We and others think it  has many benefits including preventing the animation deformity and postoperative pain is much improved. The deleterious effects of radiation were discussed and that in this scenario we would only consider expanders, not direct to implant and not a flap initially. It is possible to do reconstruction but the patient understands that this has a high rate of complications.  After  radiation in delayed reconstruction scenarios, a flap such as a DIEP or a back (LDMF) flap are most often needed. The risks associated with implant based breast reconstruction were discussed including: infection, seroma, hematoma, skin flap/nipple necrosis, change/loss of breast or nipple sensation, implant failure, silicone concerns, capsular contracture, Anaplastic large cell lymphoma, asymmetry, the need for secondary and tertiary procedures, donor site morbidity, scars, need for possible drains and postoperative antibiotics, fat necrosis, pain, reconstruction failure, DVT/PE, stroke and heart attack.   2. DIEP Autologous tissue reconstruction with a DIEP (tummy fat and skin) flap was described and discussed in detail. For patients with adequate fatty tissue of the lower abdomen this is a good option. The usual postoperative recovery of 6-8 weeks and sometimes longer was discussed and the usual postoperative course was discussed.  Skin loss from the mastectomy, which is not uncommon, can affect our reconstruction efforts negatively by forcing us  to use a much bigger skin island, having higher risks of infection and skin breakdown for example and even compromising the flap survival. Risks associated with autologous based breast reconstruction were discussed including: bleeding, infection, seroma, scarring, vessel thrombosis, partial or total flap loss, asymmetry, need for secondary and tertiary procedures, fat necrosis, DVT/PE, stroke and heart attack. During surgery, the mastectomy flaps are interrogated to determine their vascularity and perfusion status. Should the mastectomy flaps show sign of compromise, an intraoperative decision may be made to delay reconstruction. Risks discussed over and above the usual risks discussed included but were not limited to complete flap failure and the need for another type of reconstruction, chronic pain, abdominal bulge or hernia, scarring, skin breakdown, seroma (fluid  build up); all sometimes needing further intervention including surgery. Hard areas (fat necrosis) can develop which are only dealt with if they are noticeable or painful. Wound complications are common and very common in ladies that have a higher BMI. I discussed this risk as being almost 100% but that the flap is still a good option given that implants have high risks also in these patients. Bleeding, infection and blood clots that could travel to the lungs are possible and walking after surgery is very important to help prevent these clots. Asymmetries, irregularities, size issues, projection issues and other shape concerns can arise and often do, that sometimes are amenable to revisions but sometimes are just a limitation of the outcome. We discussed that symmetry procedures are often necessary and are part of the reconstructive process.   3. The Latissimus Dorsi flap (Back Muscle Flap) with an expander was discussed. The patient understands the procedure, postop course and use of the different types of implants. The risk, discussed are similar to the other types of implant, expander based reconstruction except that seroma (fluid build up) risk of 18% was highlighted. This sometimes can require further surgery. Sometimes there is a risk of decreased shoulder mobility and chronic pain is possible but this is usually well tolerated form of reconstruction.   4. LICAP/Goldilocks We discussed breast reconstruction with local tissue. Specifically, The Goldilock's procedure with possible LICAP flaps for additional volume. We discussed the limitations of this technique including that the viability of LICAP  flaps could be compromised by the mastectomy itself. We discussed the possibility of revision operations with fat grafting and/or implants in the future if she desires more volume. I would plan to doppler out the LICAP perforators preoperatively if possible to avoid injury during mastectomy.   5. We also  discussed the [facility] Health Act of 1998. All the questions were answered. The patient has all the information to make an informed decision and has a good understanding of risks, benefits and limitations.  She has all the prerequisite information to make an informed decision.    In summary, this is a very pleasant 57 y.o. F with newly diagnosed breast who presents to discuss breast reconstruction in anticipation of lumpectomy versus a mastectomy. with Dr. Ebbie.   If she decides to undergo bilateral mastectomies we have agreed upon staged reconstruction with the first stage being immediate bilateral tissue expander placement with ADM/Mesh, possible adjacent tissue rearrangement. I will place in prepectoral plane if the soft tissue supports it. Otherwise I will place subpectorally or not at all. The incision pattern to be discussed with surgical oncology, Dr. Ebbie. This woulkd be followed by bilateral DIEP flaps, as I consider she is a good candidate for the operation and her expectations are realistic.    Patient instructed to return to Dr. Ebbie for follow up and let me know when she makes the final decision.   The time documented represents the total time spent on the day of the encounter in preparing for and completing the visit. It does not include time spent by ancillary staff, a resident, a fellow, another trainee, or, for shared visits, time spent jointly with the patient or discussing the case or the performance of other separately performed services.   Time spent: 45 minutes.    Nahom Carfagno, MD [facility]   04/13/2024 4:11 PM

## 2024-04-13 NOTE — Addendum Note (Signed)
 Addended by: BRIEN SONG A on: 04/13/2024 01:55 PM   Modules accepted: Orders

## 2024-04-13 NOTE — Telephone Encounter (Signed)
 Called patient and left a VM referral has been placed and sent to [facility] with MRI results

## 2024-04-13 NOTE — Telephone Encounter (Signed)
 Pt is calling and would like Tara Yates to return her call

## 2024-04-13 NOTE — Telephone Encounter (Signed)
 Spoke with patient, she is aware referral has been sent

## 2024-04-14 ENCOUNTER — Inpatient Hospital Stay: Attending: Hematology | Admitting: Hematology

## 2024-04-14 ENCOUNTER — Institutional Professional Consult (permissible substitution): Admitting: Plastic Surgery

## 2024-04-14 VITALS — BP 128/72 | HR 79 | Temp 98.3°F | Resp 16 | Ht 64.0 in | Wt 175.0 lb

## 2024-04-14 DIAGNOSIS — Z86000 Personal history of in-situ neoplasm of breast: Secondary | ICD-10-CM | POA: Insufficient documentation

## 2024-04-14 DIAGNOSIS — Z923 Personal history of irradiation: Secondary | ICD-10-CM | POA: Diagnosis not present

## 2024-04-14 DIAGNOSIS — D0512 Intraductal carcinoma in situ of left breast: Secondary | ICD-10-CM

## 2024-04-14 DIAGNOSIS — C50411 Malignant neoplasm of upper-outer quadrant of right female breast: Secondary | ICD-10-CM | POA: Diagnosis present

## 2024-04-14 DIAGNOSIS — Z780 Asymptomatic menopausal state: Secondary | ICD-10-CM | POA: Diagnosis not present

## 2024-04-14 DIAGNOSIS — Z170 Estrogen receptor positive status [ER+]: Secondary | ICD-10-CM | POA: Insufficient documentation

## 2024-04-14 DIAGNOSIS — Z79811 Long term (current) use of aromatase inhibitors: Secondary | ICD-10-CM | POA: Insufficient documentation

## 2024-04-14 DIAGNOSIS — E785 Hyperlipidemia, unspecified: Secondary | ICD-10-CM | POA: Diagnosis not present

## 2024-04-14 DIAGNOSIS — Z79810 Long term (current) use of selective estrogen receptor modulators (SERMs): Secondary | ICD-10-CM | POA: Insufficient documentation

## 2024-04-14 DIAGNOSIS — E039 Hypothyroidism, unspecified: Secondary | ICD-10-CM | POA: Diagnosis not present

## 2024-04-14 DIAGNOSIS — Z1722 Progesterone receptor negative status: Secondary | ICD-10-CM | POA: Insufficient documentation

## 2024-04-14 DIAGNOSIS — N6022 Fibroadenosis of left breast: Secondary | ICD-10-CM | POA: Insufficient documentation

## 2024-04-14 DIAGNOSIS — Z803 Family history of malignant neoplasm of breast: Secondary | ICD-10-CM | POA: Diagnosis not present

## 2024-04-14 DIAGNOSIS — F419 Anxiety disorder, unspecified: Secondary | ICD-10-CM | POA: Diagnosis not present

## 2024-04-14 DIAGNOSIS — N6489 Other specified disorders of breast: Secondary | ICD-10-CM | POA: Insufficient documentation

## 2024-04-14 DIAGNOSIS — Z79899 Other long term (current) drug therapy: Secondary | ICD-10-CM | POA: Insufficient documentation

## 2024-04-14 DIAGNOSIS — Z1732 Human epidermal growth factor receptor 2 negative status: Secondary | ICD-10-CM | POA: Insufficient documentation

## 2024-04-14 MED ORDER — ANASTROZOLE 1 MG PO TABS: 1.0000 mg | ORAL_TABLET | Freq: Every day | ORAL | 1 refills | Status: DC

## 2024-04-14 NOTE — Assessment & Plan Note (Signed)
-  Discovered on screening mammography. Biopsy on 10/16/20 confirmed intermediate grade DCIS, ER/PR+. -Left lumpectomy on 12/18/20 again showed grade 2 DCIS with clear margins  -She declined adjuvant radiation. -she also initially declined antiestrogen therapy but agreed to start tamoxifen  at survivorship visit 06/27/21. She transitioned from sertraline  and began tamoxifen , and dose reduced to 5mg  daily due to tolerance issue --she has had two left breast biopsies, on 04/29/21 and 07/11/21, both showing ALH. She continues surveillance with alternating mammography and breast MRI.  -she underwent lumpectomy for left breast DCIS in 02/2022, margins clear  -she completed RT in 04/2022 -she is on tamoxifen  10mg  every other day

## 2024-04-14 NOTE — Progress Notes (Signed)
 [facility]   [phone] Fax:([phone]   Clinic Follow up Note   Patient Care Team: [name], MD as PCP - General (Family Medicine) [name], RN as Oncology Nurse Navigator [name], MD as Consulting Physician (General Surgery) [name], MD as Consulting Physician (Hematology) [name], MD as Consulting Physician (Radiation Oncology) Burton, Lacie K, NP as Nurse Practitioner (Nurse Practitioner)  Date of Service:  04/14/2024  CHIEF COMPLAINT: Newly diagnosed right breast cancer  CURRENT THERAPY:  Tamoxifen  10 mg daily  Oncology History   Ductal carcinoma in situ of left breast -Discovered on screening mammography. Biopsy on 10/16/20 confirmed intermediate grade DCIS, ER/PR+. -Left lumpectomy on 12/18/20 again showed grade 2 DCIS with clear margins  -She declined adjuvant radiation. -she also initially declined antiestrogen therapy but agreed to start tamoxifen  at survivorship visit 06/27/21. She transitioned from sertraline  and began tamoxifen , and dose reduced to 5mg  daily due to tolerance issue --she has had two left breast biopsies, on 04/29/21 and 07/11/21, both showing ALH. She continues surveillance with alternating mammography and breast MRI.  -she underwent lumpectomy for left breast DCIS in 02/2022, margins clear  -she completed RT in 04/2022 -she is on tamoxifen  10mg  every other day   Assessment & Plan Invasive ductal carcinoma of right breast, stage I, ER 40%+/PR-/HER2- Newly diagnosed invasive ductal carcinoma of the right breast, stage I, with a 1.3 cm ER+ (40% positive), PR-, HER2- tumor. Negative lymphovascular invasion and lymph nodes on images. Grade 2 tumor with KI-67 at 5%, indicating low proliferation, however I am concerned this could be aggressive cancer due to the weak to moderate ER positivity in 40% tumor cells.  -Discussed lumpectomy with radiation versus mastectomy, noting similar local recurrence  outcomes.  Patient is leaning towards double mastectomy due to previous left breast DCIS and her concern for future breast cancer.  However she is not able to have implant in the left breast due to previous radiation.  She is also concerned about flap reconstruction.   - I recommend Oncotype on her surgical or biopsy sample to determine if she needs chemotherapy.  We discussed potential side effects including cytopenias, diarrhea, fatigue, alopecia, infection risk, and neuropathy. Consideration of port for chemotherapy administration. Oncotype score <=25 indicates low risk, >=26 suggests high risk and chemotherapy benefit. - Order oncotype test on biopsy sample, so we can decide if she needs a port during her breast surgery. - Switch from tamoxifen  to anastrozole due to postmenopausal status and invasive cancer. -We had a lengthy discussion about her lumpectomy versus mastectomy, at the end of the consultation, I think she is leaning towards double mastectomy, and may not want reconstruction.   Intraductal carcinoma in situ of left breast, treated DCIS in the left breast previously treated with lumpectomy and radiation. Recent biopsy benign, concordant with imaging. No current evidence of cancer in the left breast. - Schedule follow-up imaging for the left breast in six months, starting with a mammogram, followed by an MRI if needed.  Postmenopausal state Postmenopausal state confirmed. Anastrozole preferred over tamoxifen  due to postmenopausal status and current invasive ductal carcinoma diagnosis. - Switch from tamoxifen  to anastrozole.  Plan - I reviewed her recent breast images and biopsy results, which showed newly diagnosed right breast invasive ductal carcinoma - Will obtain Oncotype on her biopsy, to see if she needs adjuvant chemotherapy.  She wants a port placement during her breast surgery if chemo is recommended. - She is struggling on the  decision of lumpectomy versus mastectomy.   After lengthy discussion, I think that she is leaning towards double mastectomy without reconstruction.  She will call Dr. Ebbie office for final decision. - Will change her tamoxifen  to anastrozole 1 mg daily while she is waiting for surgery. - Will call her with Oncotype results. - I will see her after her surgery.   SUMMARY OF ONCOLOGIC HISTORY: Oncology History  Ductal carcinoma in situ of left breast  10/16/2020 Pathology Results   Breast, left, needle core biopsy, UOQ posterior - DUCTAL CARCINOMA IN SITU WITH CALCIFICATIONS, INTERMEDIATE NUCLEAR GRADE  PROGNOSTIC INDICATORS Results: IMMUNOHISTOCHEMICAL AND MORPHOMETRIC ANALYSIS PERFORMED MANUALLY Estrogen Receptor: 95%, POSITIVE, STRONG STAINING INTENSITY Progesterone Receptor: 20%, POSITIVE, STRONG STAINING INTENSITY   11/09/2020 Initial Diagnosis   Ductal carcinoma in situ of left breast   11/16/2020 Genetic Testing   Negative hereditary cancer genetic testing: no pathogenic variants detected in [facility] BRCAPlus Panel.  The report date is Nov 16, 2020. The BRCAplus panel offered by [employer] and includes sequencing and deletion/duplication analysis for the following 8 genes: ATM, BRCA1, BRCA2, CDH1, CHEK2, PALB2, PTEN, and TP53.    Negative hereditary cancer genetic testing: no pathogenic variants detected in [facility] CustomNext-Cancer +RNAinsight Panel.  Variant of uncertain significance detected in CDKN2A at c.440C>T (p.A147V). The report date is Nov 26, 2020.   The CustomNext-Cancer+RNAinsight panel offered by [employer] includes sequencing and rearrangement analysis for the following 47 genes:  APC, ATM, AXIN2, BARD1, BMPR1A, BRCA1, BRCA2, BRIP1, CDH1, CDK4, CDKN2A, CHEK2, DICER1, EPCAM, GREM1, HOXB13, MEN1, MLH1, MSH2, MSH3, MSH6, MUTYH, NBN, NF1, NF2, NTHL1, PALB2, PMS2, POLD1, POLE, PTEN, RAD51C, RAD51D, RECQL, RET, SDHA, SDHAF2, SDHB, SDHC, SDHD, SMAD4, SMARCA4, STK11, TP53, TSC1, TSC2, and VHL.  RNA data is routinely  analyzed for use in variant interpretation for all genes.    12/18/2020 Surgery   Left Lumpectomy  FINAL MICROSCOPIC DIAGNOSIS:   A. BREAST, LEFT, LUMPECTOMY:  - Ductal carcinoma in situ, intermediate grade, involving areas of  sclerosing adenosis, see comment  - Resection margins are negative for DCIS; closest is the posterior  margin at 3 mm  - Biopsy site changes  - Calcifications  - See oncology table   B. BREAST, LEFT ADDITIONAL LATERAL MARGIN, EXCISION:  - Fibrocystic change with sclerosing adenosis and calcifications  - Negative for carcinoma   C. BREAST, LEFT ADDITIONAL POSTERIOR MARGIN, EXCISION:  - Benign breast parenchyma, negative for carcinoma    12/18/2020 Cancer Staging   Staging form: Breast, AJCC 8th Edition - Pathologic stage from 12/18/2020: Stage Unknown (pTis (DCIS), pNX, cM0, G2, ER+, PR+, HER2: Not Assessed) - Signed by [name], NP on 06/27/2021 Stage prefix: Initial diagnosis Histologic grading system: 3 grade system   06/27/2021 Survivorship   SCP delivered by Lacie Burton, NP   07/2021 -  Anti-estrogen oral therapy   Pending start of chemoprevention with tamoxifen  in 07/2021    12/19/2021 Imaging   IMPRESSION: Breast MRI impression:  1. Area of non mass enhancement in the left breast has increased in size from the prior MRI. It lies just below cylinder shaped biopsy clip. Given the position of the post biopsy marker clip and the interval increase in size of this enhancement, re-biopsy is recommended. 2. No other areas of abnormal enhancement in the left breast. 3. No evidence of right breast malignancy.   RECOMMENDATION: 1. MRI guided core needle biopsy of the area of non mass enhancement in the left breast.   12/27/2021 Pathology Results  Diagnosis Breast, left, needle core biopsy, upper inner quadrant, cylinder clip - LOBULAR NEOPLASIA (ATYPICAL LOBULAR HYPERPLASIA). - FIBROCYSTIC CHANGES WITH USUAL DUCTAL HYPERPLASIA AND  CALCIFICATIONS. - SEE NOTE.   02/06/2022 Surgery   Left breast lumpectomy by Dr. [name]   02/06/2022 Pathology Results   FINAL MICROSCOPIC DIAGNOSIS: A. BREAST, LEFT, LUMPECTOMY: Ductal carcinoma in-situ, apocrine type with clear margins of resection. Radial sclerosing lesion with extensive atypical lobular hyperplasia in the immediate vicinity and elsewhere in the breast. Multiple foci of sclerosing adenosis. Invasive carcinoma is not identified.  Estrogen receptors and progesterone receptors both negative   03/18/2022 -  Radiation Therapy   Adjuvant radiation by Dr. Dewey, plan to complete 04/15/2022      Discussed the use of AI scribe software for clinical note transcription with the patient, who gave verbal consent to proceed.  History of Present Illness Tara Yates is a 58 year old female with invasive ductal carcinoma of the right breast who presents for follow-up of breast disease. She is accompanied by her husband, Lynwood.  She has invasive ductal carcinoma in the right breast, diagnosed via needle biopsy on April 06, 2024. The tumor is grade 2, with negative lymphovascular invasion, and measures 1.3 cm on MRI. She is currently on tamoxifen , increased to 10 mg daily. Her ER status is 40% positive with weak to moderate staining, PR is negative, and HER2 is negative. The KI-67 is 5%, indicating low proliferation.  She has a history of ductal carcinoma in situ in the left breast, treated with lumpectomy and radiation. The left breast biopsy was benign, and the pathology is concordant with imaging findings. She has dense breast tissue, complicating imaging, and has undergone radiation therapy in the past. She is postmenopausal and tested negative for BRCA mutations in 2022.  She is concerned about the risk of recurrence and the impact of treatment on her quality of life, particularly in relation to her children. She is working with an Camera operator to improve her  diet and reduce alcohol consumption. She is considering acupuncture and other lifestyle changes to support her health. She is contemplating the implications of different surgical options on her body image. No specific symptoms were elicited during the review of systems.     All other systems were reviewed with the patient and are negative.  MEDICAL HISTORY:  Past Medical History:  Diagnosis Date   Allergy    Anxiety    Back pain    Breast cancer (HCC)    Constipation    Depression    Ductal carcinoma in situ of left breast 11/09/2020   Family history of breast cancer 11/09/2020   HSV (herpes simplex virus) anogenital infection    Hyperlipidemia    Hypothyroidism    Joint pain    Plantar fasciitis, bilateral    SOBOE (shortness of breath on exertion)     SURGICAL HISTORY: Past Surgical History:  Procedure Laterality Date   BREAST BIOPSY Right 02/28/2015   BREAST BIOPSY Left 07/11/2021   BREAST EXCISIONAL BIOPSY Right 03/29/2015   had seed placement as wel   BREAST LUMPECTOMY WITH RADIOACTIVE SEED LOCALIZATION Left 12/18/2020   Procedure: LEFT BREAST LUMPECTOMY WITH RADIOACTIVE SEED LOCALIZATION;  Surgeon: Bury Cough, MD;  Location: [facility];  Service: General;  Laterality: Left;   CARPAL TUNNEL RELEASE Right 2022   ENDOMETRIAL ABLATION     MOUTH SURGERY     RADIOACTIVE SEED GUIDED EXCISIONAL BREAST BIOPSY Right 04/03/2015   Procedure: RADIOACTIVE  SEED GUIDED EXCISIONAL BREAST BIOPSY;  Surgeon: Donnice Bury, MD;  [facility];  Service: General;  Laterality: Right;   RADIOACTIVE SEED GUIDED EXCISIONAL BREAST BIOPSY Left 02/06/2022   Procedure: LEFT BREAST SEED GUIDED EXCISIONAL BIOPSY;  Surgeon: Bury Donnice, MD;  Location[facility];  Service: General;  Laterality: Left;  LMA    I have reviewed the social history and family history with the patient and they are unchanged from previous note.  ALLERGIES:  has no known  allergies.  MEDICATIONS:  Current Outpatient Medications  Medication Sig Dispense Refill   anastrozole (ARIMIDEX) 1 MG tablet Take 1 tablet (1 mg total) by mouth daily. 30 tablet 1   BIOTIN MAXIMUM PO Take by mouth.     Calcium Carb-Cholecalciferol (CALCIUM 600 + D PO) Take by mouth.     cholecalciferol (VITAMIN D3) 25 MCG (1000 UNIT) tablet Take 1,000 Units by mouth daily.     levothyroxine  (SYNTHROID ) 25 MCG tablet TAKE 1 TABLET BY MOUTH EVERY DAY BEFORE BREAKFAST 30 tablet 3   Multiple Vitamin (MULTI-VITAMIN) tablet Take by mouth.     Nutritional Supplements (JUICE PLUS FIBRE PO)      Probiotic Product (ALIGN PO) Take by mouth.     tamoxifen  (NOLVADEX ) 10 MG tablet TAKE 1 TABLET BY MOUTH EVERY OTHER DAY 15 tablet 5   EC-RX TESTOSTERONE TD 2 %. (Patient not taking: Reported on 04/14/2024)     No current facility-administered medications for this visit.    PHYSICAL EXAMINATION: ECOG PERFORMANCE STATUS: 0 - Asymptomatic  Vitals:   04/14/24 1029  BP: 128/72  Pulse: 79  Resp: 16  Temp: 98.3 F (36.8 C)  SpO2: 99%   Wt Readings from Last 3 Encounters:  04/14/24 175 lb (79.4 kg)  04/13/24 174 lb (78.9 kg)  01/29/24 170 lb 9.6 oz (77.4 kg)     GENERAL:alert, no distress and comfortable SKIN: skin color, texture, turgor are normal, no rashes or significant lesions EYES: normal, Conjunctiva are pink and non-injected, sclera clear Musculoskeletal:no cyanosis of digits and no clubbing  NEURO: alert & oriented x 3 with fluent speech, no focal motor/sensory deficits  Physical Exam    LABORATORY DATA:  I have reviewed the data as listed    Latest Ref Rng & Units 01/29/2024    2:28 PM 09/24/2023    8:34 AM 01/15/2023    8:53 AM  CBC  WBC 3.8 - 10.8 Thousand/uL 4.6  4.4  3.7   Hemoglobin 11.7 - 15.5 g/dL 86.6  86.4  86.5   Hematocrit 35.0 - 45.0 % 40.5  39.8  38.6   Platelets 140 - 400 Thousand/uL 201  193  195         Latest Ref Rng & Units 01/29/2024    2:28 PM  09/24/2023    8:34 AM 01/15/2023    8:53 AM  CMP  Glucose 65 - 99 mg/dL 87  93  90   BUN 7 - 25 mg/dL 12  15  14    Creatinine 0.50 - 1.03 mg/dL 9.38  9.30  9.27   Sodium 135 - 146 mmol/L 139  138  140   Potassium 3.5 - 5.3 mmol/L 4.0  4.3  4.1   Chloride 98 - 110 mmol/L 102  103  105   CO2 20 - 32 mmol/L 27  28  26    Calcium 8.6 - 10.4 mg/dL 9.4  8.9  9.1   Total Protein 6.1 - 8.1 g/dL 6.9  6.8  7.0   Total Bilirubin 0.2 - 1.2 mg/dL 0.5  0.3  0.4   Alkaline Phos 38 - 126 U/L  41  46   AST 10 - 35 U/L 23  18  22    ALT 6 - 29 U/L 35  26  34       RADIOGRAPHIC STUDIES: I have personally reviewed the radiological images as listed and agreed with the findings in the report. No results found.    No orders of the defined types were placed in this encounter.  All questions were answered. The patient knows to call the clinic with any problems, questions or concerns. No barriers to learning was detected. The total time spent in the appointment was 60 minutes, including review of chart and various tests results, discussions about plan of care and coordination of care plan     Onita Mattock, MD 04/14/2024

## 2024-04-15 ENCOUNTER — Encounter: Payer: Self-pay | Admitting: *Deleted

## 2024-04-15 ENCOUNTER — Telehealth: Payer: Self-pay | Admitting: *Deleted

## 2024-04-15 NOTE — Telephone Encounter (Signed)
 Ordered oncotype (core) per Dr. Lanny. Requisition sent to [facility] and exact sciences.

## 2024-04-20 NOTE — Progress Notes (Signed)
 Location of Breast Cancer:  Ductal carcinoma in situ of right breast   Histology per Pathology Report:    Receptor Status: ER(pos-40%), PR (neg), Her2-neu (neg), Ki-(5%)  Did patient present with symptoms (if so, please note symptoms) or was this found on screening mammography? Screening breast MRI  Past/Anticipated interventions by surgeon, if any: Right breast radioactive seed guided excisional breast biopsy on 04/03/2015, left breast lumpectomy on 12/18/2020 and again on 02/09/2022  Past/Anticipated interventions by medical oncology, if any:  She initially declined antiestrogen therapy but agreed to start tamoxifen  at survivorship visit 06/27/21. She transitioned from sertraline  and began tamoxifen , and dose reduced to 5mg  daily due to tolerance issue -she underwent lumpectomy for left breast DCIS in 02/2022, margins clear  -she completed RT in 04/2022 -she is on tamoxifen  10mg  every other day   Lymphedema issues, if any:  {:18581} {t:21944}   Pain issues, if any:  {:18581} {PAIN DESCRIPTION:21022940}  Skin issues if any   SAFETY ISSUES: Prior radiation? yes Pacemaker/ICD? {:18581} Possible current pregnancy? No, postmenopausal Is the patient on methotrexate? no  Current Complaints / other details:  ***    Tara JULIANNA Frost, LPN 89/84/7974,7:55 PM

## 2024-04-21 ENCOUNTER — Ambulatory Visit
Admission: RE | Admit: 2024-04-21 | Discharge: 2024-04-21 | Disposition: A | Discharge status: Home / Self Care | Source: Ambulatory Visit | Attending: Radiation Oncology | Admitting: Radiation Oncology

## 2024-04-21 VITALS — BP 115/80 | HR 63 | Temp 97.9°F | Resp 18 | Ht 64.0 in | Wt 174.2 lb

## 2024-04-21 DIAGNOSIS — Z170 Estrogen receptor positive status [ER+]: Secondary | ICD-10-CM

## 2024-04-21 NOTE — Progress Notes (Addendum)
 Radiation Oncology         [phone] ________________________________  Name: Tara Yates        MRN: [id number]  Date of Service: 04/21/2024 DOB: 08/30/1965  RR:[id number], Clotilda SAUNDERS, MD  [name], MD     Wearing REFERRING PHYSICIAN: [name], MD   ------------------------------------------------------------------------------------------------ Addendum Please see the note from Donald Husband, PA-C from today's visit for more details of today's encounter.  I have personally performed a face to face diagnostic evaluation on this patient and devised the following assessment and plan.  The patient underwent evaluation today for her new diagnosis of a grade 2 invasive ductal carcinoma of the right breast.  Clinically this appears to correspond to a T1 cN0 M0 tumor.  The patient has been thinking about surgical options and is considering a double mastectomy.  It appears she has not completely made up her mind about this but is certainly leaning in this direction.  We discussed that we would not anticipate a need for adjuvant radiation treatment if she did proceed with mastectomy, although this would ultimately depend on the final pathologic report.  We discussed different options in terms of reconstruction in this setting and encouraged her to more fully discuss these issues with plastic surgery.  She indicated today that she would like a referral for a second opinion from plastic surgery so we will make that referral.  At this time, we do not anticipate her needing adjuvant radiation treatment but certainly would be happy to see her if there are any questions along the slides after her upcoming surgery.  Staging-clinical T1 cN0 M0 invasive ductal carcinoma of the right breast   [name] [name], MD, PhD ------------------------------------------------------------------------------------------------    DIAGNOSIS: The encounter diagnosis was Malignant neoplasm of upper-outer quadrant of right  breast in female, estrogen receptor positive (HCC).   HISTORY OF PRESENT ILLNESS: Tara Yates is a 58 y.o. female with a  left breast cancer.  The patient was seen for screening mammography in March 2022, but there was a possible distortion seen in the left breast.  Further diagnostic work-up showed no suspicious mass by ultrasound and her left axilla was negative.  Diagnostic mammogram however showed possible subtle distortion in the upper outer quadrant of the left breast and a stereotactic biopsy was obtained on 10/16/2020 showing intermediate grade DCIS that was ER/PR positive.  She was counseled on lumpectomy and underwent left lumpectomy on 12/18/2020 which showed DCIS intermediate grade involving areas of sclerosing adenosis.  Her margins were all negative for DCIS the closest margin being 3 mm, the overall size of the malignant component was not able to be determined, additional lateral and posterior margins were negative for disease.  She was offered adjuvant radiotherapy but declined. She initially declined antiestrogen therapy, but did reconsider this and had been on a low dose of Tamoxifen  by Dr. Lanny.  She had an MRI of the breast on 04/23/2021 that showed suspicious linear non-mass enhancement in the upper inner left breast measuring 1.6 cm.  She underwent biopsy of this area on 04/29/2021 which showed lobular neoplasia consistent with atypical lobular hyperplasia involving a small sclerotic lesion.  She continued in surveillance and another mammogram for clip placement in the left on 07/11/2021 showed a dumbbell shaped marker in the upper inner breast repeat biopsy on 07/11/2021 of the left breast showed atypical lobular hyperplasia with sclerosing adenosis but no malignancy.  Repeat surveillance mammogram on 09/20/2021 showed postop changes in the left breast and  at the site of the barbell clip no suspicious mass was identified she underwent MRI of the breast on 12/19/2021 and again the non-mass  enhancement was seen again in the left breast, this measured up to 2.1 cm.  She returned for MRI guided biopsy of this on 12/27/2021 and this showed again atypical lobular hyper plasia with fibrocystic changes and given that she was on tamoxifen  with increase in this size, Dr. Ebbie recommended lumpectomy.  She proceeded with this on 02/06/2022 and this revealed intermediate grade DCIS with radial sclerosing lesion and extensive atypical lobular hyperplasia and multiple foci of sclerosing adenosis no invasive disease was present, her cancer was ER/PR positive.  Margins were clear with the closest being the superior margin at 3 mm.She then proceeded with adjuvant radiation to the left breast which she completed in October 2023.   Since her last visit, she has consistently taken antiestrogen therapy. Given her dense breast tissue of category D, she was offered contrast-enhanced mammogram versus MRI scan and underwent MRI on 04/26/2023.  No MRI evidence of residual left breast carcinoma or abnormalities in the right breast were appreciated.  She had a mammogram in March 2025 with diagnostic technique that showed architectural distortion in the left breast, consistent with her prior breast surgery without suspicious findings.  No abnormalities were noted in the right breast.  An MRI of bilateral breasts on 03/26/2024 showed adjacent linear non-mass enhancement in the lateral superior right breast with more lateral linear non-mass enhancement measuring 13 mm in diameter.  The slightly more medial linear non-mass enhancement measured 18 mm.  In the left breast approximately 3.5 cm of linear branching clumped non-mass enhancement were noted in the upper inner quadrant of the left breast in the area of prior biopsies and excision extending medially from the level of the biopsy clip and was morphologically similar to the previously biopsied non-mass enhancement in the region.  There was edema through the left breast  consistent with prior radiation therapy.  No abnormal lymph nodes were noted.  She was counseled on the rationale for a biopsy of the right breast findings and left breast findings.  This was performed on 04/06/2024, the left breast needle core biopsy in the upper central anterior breast showed mild fibrocystic change with calcifications negative for carcinoma, the right breast upper outer quadrant showed grade 2 invasive ductal carcinoma with calcifications present with benign breast parenchyma, and her tumor in the right breast was ER positive weak to moderate staining, PR negative, HER2 negative with a Ki-67 of 5%.  She was counseled on options to consider lumpectomy with sentinel lymph node biopsy of the right breast, however she is currently considering bilateral mastectomies.  She is seen today to consider discussion of further radiotherapy to the right breast if she proceeded with breast conservation and/or if nodal disease was present regardless of surgical approach.  Of note she is contemplating her options of reconstruction regarding the consideration of bilateral mastectomies.  She states that she has been told that she is not a candidate for implant reconstruction by her surgeon and hence was referred to plastics to discuss consideration of autologous flap.      PREVIOUS RADIATION THERAPY:   03/18/2022 through 04/16/2022 Site Technique Total Dose (Gy) Dose per Fx (Gy) Completed Fx Beam Energies  Breast, Left: Breast_L 3D 42.56/42.56 2.66 16/16 10X, 6XFFF  Breast, Left: Breast_L_Bst 3D 8/8 2 4/4 6X, 10X     PAST MEDICAL HISTORY:  Past Medical History:  Diagnosis Date  Allergy    Anxiety    Back pain    Breast cancer (HCC)    Constipation    Depression    Ductal carcinoma in situ of left breast 11/09/2020   Family history of breast cancer 11/09/2020   HSV (herpes simplex virus) anogenital infection    Hyperlipidemia    Hypothyroidism    Joint pain    Plantar fasciitis, bilateral     SOBOE (shortness of breath on exertion)        PAST SURGICAL HISTORY: Past Surgical History:  Procedure Laterality Date   BREAST BIOPSY Right 02/28/2015   BREAST BIOPSY Left 07/11/2021   BREAST EXCISIONAL BIOPSY Right 03/29/2015   had seed placement as wel   BREAST LUMPECTOMY WITH RADIOACTIVE SEED LOCALIZATION Left 12/18/2020   Procedure: LEFT BREAST LUMPECTOMY WITH RADIOACTIVE SEED LOCALIZATION;  Surgeon: [name], MD;  Location: [facility];  Service: General;  Laterality: Left;   CARPAL TUNNEL RELEASE Right 2022   ENDOMETRIAL ABLATION     MOUTH SURGERY     RADIOACTIVE SEED GUIDED EXCISIONAL BREAST BIOPSY Right 04/03/2015   Procedure: RADIOACTIVE SEED GUIDED EXCISIONAL BREAST BIOPSY;  Surgeon: Cough Ebbie, MD;  Location: [facility];  Service: General;  Laterality: Right;   RADIOACTIVE SEED GUIDED EXCISIONAL BREAST BIOPSY Left 02/06/2022   Procedure: LEFT BREAST SEED GUIDED EXCISIONAL BIOPSY;  Surgeon: [name], MD;  Location: [facility];  Service: General;  Laterality: Left;  LMA     FAMILY HISTORY:  Family History  Problem Relation Age of Onset   Heart disease Mother    Heart failure Mother    Heart attack Mother    Hyperlipidemia Mother    Sudden death Mother    Colon polyps Father    Heart disease Father    Hyperlipidemia Father    Hypertension Father    Thyroid  disease Father    Sleep apnea Father    Obesity Father    Breast cancer Paternal Grandmother        dx 33s, d. 69   Breast cancer Other        PGM's sisters, x2, dx unknown age   Colon cancer Neg Hx    Esophageal cancer Neg Hx    Rectal cancer Neg Hx    Stomach cancer Neg Hx      SOCIAL HISTORY:  reports that she quit smoking about 25 years ago. Her smoking use included cigarettes. She started smoking about 54 years ago. She has a 29 pack-year smoking history. She has never used smokeless tobacco. She reports current alcohol use of about 7.0 - 14.0  standard drinks of alcohol per week. She reports that she does not use drugs. The patient is married. She has two children who are young adults. 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   ALLERGIES: Short ragweed pollen ext   MEDICATIONS:  Current Outpatient Medications  Medication Sig Dispense Refill   LAVENDER OIL PO Take by mouth.     magnesium gluconate (MAGONATE) 500 (27 Mg) MG TABS tablet Take 500 mg by mouth in the morning and at bedtime.     Menaquinone-7 (VITAMIN K2 PO) Take by mouth.     Omega-3 Fatty Acids (OMEGA 3 FISH OIL PO) Take by mouth.     anastrozole (ARIMIDEX) 1 MG tablet Take 1 tablet (1 mg total) by mouth daily. 30 tablet 1   BIOTIN MAXIMUM PO Take by mouth.     Calcium Carb-Cholecalciferol (CALCIUM 600 +  D PO) Take by mouth.     cholecalciferol (VITAMIN D3) 25 MCG (1000 UNIT) tablet Take 1,000 Units by mouth daily.     EC-RX TESTOSTERONE TD 2 %. (Patient not taking: Reported on 04/21/2024)     levothyroxine  (SYNTHROID ) 25 MCG tablet TAKE 1 TABLET BY MOUTH EVERY DAY BEFORE BREAKFAST 30 tablet 3   Multiple Vitamin (MULTI-VITAMIN) tablet Take by mouth.     Probiotic Product (ALIGN PO) Take by mouth.     tamoxifen  (NOLVADEX ) 10 MG tablet TAKE 1 TABLET BY MOUTH EVERY OTHER DAY (Patient not taking: Reported on 04/21/2024) 15 tablet 5   No current facility-administered medications for this encounter.     REVIEW OF SYSTEMS: On review of systems, the patient reports that she is doing okay overall. She's concerned about developing new disease despite being diligent about her antiestrogen therapy as well as in going routinely for imaging didn't detect something sooner. She reports her left breast is firm, and somewhat retracted since her radiation. No other breast complaints are verbalized.      PHYSICAL EXAM:  Wt Readings from Last 3 Encounters:  04/21/24 174 lb 3.2 oz (79 kg)  04/14/24 175 lb (79.4 kg)  04/13/24 174 lb (78.9 kg)    Temp Readings from Last 3 Encounters:  04/21/24 97.9 F (36.6 C)  04/14/24 98.3 F (36.8 C) (Temporal)  01/29/24 98.3 F (36.8 C) (Oral)   BP Readings from Last 3 Encounters:  04/21/24 115/80  04/14/24 128/72  04/13/24 133/85   Pulse Readings from Last 3 Encounters:  04/21/24 63  04/14/24 79  04/13/24 72   Pain Assessment Pain Score: 0-No pain/10  In general this is a well appearing caucasian female in no acute distress. She's alert and oriented x4 and appropriate throughout the examination. Cardiopulmonary assessment is negative for acute distress and she exhibits normal effort.  Breast exam is deferred.     ECOG = 0  0 - Asymptomatic (Fully active, able to carry on all predisease activities without restriction)  1 - Symptomatic but completely ambulatory (Restricted in physically strenuous activity but ambulatory and able to carry out work of a light or sedentary nature. For example, light housework, office work)  2 - Symptomatic, <50% in bed during the day (Ambulatory and capable of all self care but unable to carry out any work activities. Up and about more than 50% of waking hours)  3 - Symptomatic, >50% in bed, but not bedbound (Capable of only limited self-care, confined to bed or chair 50% or more of waking hours)  4 - Bedbound (Completely disabled. Cannot carry on any self-care. Totally confined to bed or chair)  5 - Death   Raylene MM, Creech RH, Tormey DC, et al. [phone]). Toxicity and response criteria of the [facility]. Am. DOROTHA Bridges. Oncol. 5 (6): 649-55    LABORATORY DATA:  Lab Results  Component Value Date   WBC 4.6 01/29/2024   HGB 13.3 01/29/2024   HCT 40.5 01/29/2024   MCV 96.4 01/29/2024   PLT 201 01/29/2024   Lab Results  Component Value Date   NA 139 01/29/2024   K 4.0 01/29/2024   CL 102 01/29/2024   CO2 27 01/29/2024   Lab Results  Component Value Date   ALT 35 (H) 01/29/2024   AST 23 01/29/2024   ALKPHOS 41  09/24/2023   BILITOT 0.5 01/29/2024      RADIOGRAPHY: MR LT BREAST BX W LOC DEV 1ST LESION IMAGE BX SPEC  MR GUIDE Addendum Date: 04/07/2024 ADDENDUM REPORT: 04/07/2024 13:21 ADDENDUM: PATHOLOGY revealed: Site 1. Breast, right, needle core biopsy, upper slight outer posterior, cylinder clip - INVASIVE DUCTAL CARCINOMA, - OVERALL GRADE: 2 -- LYMPHOVASCULAR INVASION: NOT IDENTIFIED - CANCER LENGTH: 0.5 CM - CALCIFICATIONS: PRESENT, WITH BENIGN BREAST PARENCHYMA -- OTHER FINDINGS: NONE Pathology results are CONCORDANT with imaging findings, per Dr. Curtistine Noble. PATHOLOGY revealed: Site 2. Breast, left, needle core biopsy, upper central anterior, cylinder clip - MILD FIBROCYSTIC CHANGE WITH CALCIFICATIONS - NEGATIVE FOR CARCINOMA. Pathology results are CONCORDANT with imaging findings, per Dr. Curtistine Noble. Pathology results and recommendations below were discussed with patient by telephone on 04/07/24 by Rock Hover RN. Patient reported biopsy site within normal limits with slight tenderness at the site. Post biopsy care instructions were reviewed, questions were answered and my direct phone number was provided to patient. Patient was instructed to call [facility] if any concerns or questions arise related to the biopsy. RECOMMENDATION: 1. Surgical and oncological consultation for site 1 only. On 04/07/2024, Rock Hover RN notified patient's surgeon (Dr. Donnice Bury) and oncologist (Dr. Onita Mattock) and team with request for patient appointment. 2. Recommend 6 month follow-up LEFT breast diagnostic mammogram (and ultrasound if deemed necessary) to ensure stability of biopsied area. Patient informed a reminder notice will be sent regarding this appointment and she may need to call mammography site to schedule this appointment. Pathology results reported by Mliss Molt RN 04/07/2024. Electronically Signed   By: Curtistine Noble   On: 04/07/2024 13:21   Result Date: 04/07/2024 CLINICAL  DATA:  58 year old female here for bilateral breast MRI biopsy. History of DCIS x2 in the left breast, most recently August 2023. EXAM: MRI GUIDED CORE NEEDLE BIOPSY OF THE LEFT AND RIGHT BREAST TECHNIQUE: Multiplanar, multisequence MR imaging of both breasts was performed both before and after administration of intravenous contrast. CONTRAST:  7.5 ml Vueway  COMPARISON:  Mammogram dated September 29, 2022. Multiple prior breast MRIs, most recently dated 03/26/2024. FINDINGS: I met with the patient, and we discussed the procedure of MRI guided biopsy, including risks, benefits, and alternatives. Specifically, we discussed the risks of infection, bleeding, tissue injury, clip migration, and inadequate sampling. Informed, written consent was given. The usual time out protocol was performed immediately prior to the procedure. Left breast: Using sterile technique, 1% Lidocaine  with and without epinephrine , MRI guidance, and a 9 gauge vacuum assisted device, biopsy was performed of the area of non mass enhancement about the upper central breast, anterior third using a lateral approach. At the conclusion of the procedure, a cylinder tissue marker clip was deployed into the biopsy cavity. Right breast: Using sterile technique, 1% Lidocaine  with and without epinephrine , MRI guidance, and a 9 gauge vacuum assisted device, biopsy was performed of the area of non mass enhancement about the upper slightly outer breast, posterior third using a lateral approach. At the conclusion of the procedure, a cylinder tissue marker clip was deployed into the biopsy cavity. Follow-up 2-view mammogram was performed and dictated separately. IMPRESSION: MRI guided biopsy of both breasts as above. No apparent complications. Electronically Signed: By: Curtistine Noble On: 04/06/2024 10:21   MR RT BREAST BX W LOC DEV 1ST LESION IMAGE BX SPEC MR GUIDE Addendum Date: 04/07/2024 ADDENDUM REPORT: 04/07/2024 13:21 ADDENDUM: PATHOLOGY revealed: Site 1.  Breast, right, needle core biopsy, upper slight outer posterior, cylinder clip - INVASIVE DUCTAL CARCINOMA, - OVERALL GRADE: 2 -- LYMPHOVASCULAR INVASION: NOT IDENTIFIED - CANCER LENGTH: 0.5 CM -  CALCIFICATIONS: PRESENT, WITH BENIGN BREAST PARENCHYMA -- OTHER FINDINGS: NONE Pathology results are CONCORDANT with imaging findings, per Dr. Curtistine Noble. PATHOLOGY revealed: Site 2. Breast, left, needle core biopsy, upper central anterior, cylinder clip - MILD FIBROCYSTIC CHANGE WITH CALCIFICATIONS - NEGATIVE FOR CARCINOMA. Pathology results are CONCORDANT with imaging findings, per Dr. Curtistine Noble. Pathology results and recommendations below were discussed with patient by telephone on 04/07/24 by Rock Hover RN. Patient reported biopsy site within normal limits with slight tenderness at the site. Post biopsy care instructions were reviewed, questions were answered and my direct phone number was provided to patient. Patient was instructed to call [facility] if any concerns or questions arise related to the biopsy. RECOMMENDATION: 1. Surgical and oncological consultation for site 1 only. On 04/07/2024, Rock Hover RN notified patient's surgeon (Dr. Donnice Bury) and oncologist (Dr. Onita Mattock) and team with request for patient appointment. 2. Recommend 6 month follow-up LEFT breast diagnostic mammogram (and ultrasound if deemed necessary) to ensure stability of biopsied area. Patient informed a reminder notice will be sent regarding this appointment and she may need to call mammography site to schedule this appointment. Pathology results reported by Mliss Molt RN 04/07/2024. Electronically Signed   By: Curtistine Noble   On: 04/07/2024 13:21   Result Date: 04/07/2024 CLINICAL DATA:  58 year old female here for bilateral breast MRI biopsy. History of DCIS x2 in the left breast, most recently August 2023. EXAM: MRI GUIDED CORE NEEDLE BIOPSY OF THE LEFT AND RIGHT BREAST TECHNIQUE:  Multiplanar, multisequence MR imaging of both breasts was performed both before and after administration of intravenous contrast. CONTRAST:  7.5 ml Vueway  COMPARISON:  Mammogram dated September 29, 2022. Multiple prior breast MRIs, most recently dated 03/26/2024. FINDINGS: I met with the patient, and we discussed the procedure of MRI guided biopsy, including risks, benefits, and alternatives. Specifically, we discussed the risks of infection, bleeding, tissue injury, clip migration, and inadequate sampling. Informed, written consent was given. The usual time out protocol was performed immediately prior to the procedure. Left breast: Using sterile technique, 1% Lidocaine  with and without epinephrine , MRI guidance, and a 9 gauge vacuum assisted device, biopsy was performed of the area of non mass enhancement about the upper central breast, anterior third using a lateral approach. At the conclusion of the procedure, a cylinder tissue marker clip was deployed into the biopsy cavity. Right breast: Using sterile technique, 1% Lidocaine  with and without epinephrine , MRI guidance, and a 9 gauge vacuum assisted device, biopsy was performed of the area of non mass enhancement about the upper slightly outer breast, posterior third using a lateral approach. At the conclusion of the procedure, a cylinder tissue marker clip was deployed into the biopsy cavity. Follow-up 2-view mammogram was performed and dictated separately. IMPRESSION: MRI guided biopsy of both breasts as above. No apparent complications. Electronically Signed: By: Curtistine Noble On: 04/06/2024 10:21   MM CLIP PLACEMENT RIGHT Result Date: 04/06/2024 CLINICAL DATA:  58 year old female status post bilateral MRI biopsy. EXAM: 3D DIAGNOSTIC BILATERAL MAMMOGRAM POST MRI BIOPSY COMPARISON:  Previous exam(s). ACR Breast Density Category d: The breasts are extremely dense, which lowers the sensitivity of mammography. FINDINGS: 3D Mammographic images were obtained  following MRI guided biopsy of non mass enhancement in both breasts. The biopsy marking clips are in expected position at the site of biopsy. IMPRESSION: 1. Appropriate positioning of the cylinder shaped biopsy marking clip at the site of biopsy in the left upper central anterior breast. 2.  Appropriate positioning of the cylinder shaped biopsy marking clip at the site of biopsy in the right upper slightly outer posterior breast. Final Assessment: Post Procedure Mammograms for Marker Placement Electronically Signed   By: Curtistine Noble   On: 04/06/2024 10:25   MM CLIP PLACEMENT LEFT Result Date: 04/06/2024 CLINICAL DATA:  58 year old female status post bilateral MRI biopsy. EXAM: 3D DIAGNOSTIC BILATERAL MAMMOGRAM POST MRI BIOPSY COMPARISON:  Previous exam(s). ACR Breast Density Category d: The breasts are extremely dense, which lowers the sensitivity of mammography. FINDINGS: 3D Mammographic images were obtained following MRI guided biopsy of non mass enhancement in both breasts. The biopsy marking clips are in expected position at the site of biopsy. IMPRESSION: 1. Appropriate positioning of the cylinder shaped biopsy marking clip at the site of biopsy in the left upper central anterior breast. 2. Appropriate positioning of the cylinder shaped biopsy marking clip at the site of biopsy in the right upper slightly outer posterior breast. Final Assessment: Post Procedure Mammograms for Marker Placement Electronically Signed   By: Curtistine Noble   On: 04/06/2024 10:25   MR BREAST BILATERAL W WO CONTRAST INC CAD Result Date: 03/28/2024 CLINICAL DATA:  Patient with personal history of LEFT breast DCIS treated with lumpectomy in June of 2022. Pain declined adjuvant radiation. Also with history of benign RIGHT breast excisional biopsy. MRI guided biopsy on 12/27/2021 for increasing enhancement in the LEFT breast which revealed atypical lobular hyperplasia and fibrocystic changes, which was also excised but  upgraded to DCIS at the time of surgery. Received adjuvant radiation, completed in October 2023. Currently on tamoxifen . EXAM: BILATERAL BREAST MRI WITH AND WITHOUT CONTRAST TECHNIQUE: Multiplanar, multisequence MR images of both breasts were obtained prior to and following the intravenous administration of 7 ml of Vueway  Three-dimensional MR images were rendered by post-processing of the original MR data on an independent workstation. The three-dimensional MR images were interpreted, and findings are reported in the following complete MRI report for this study. Three dimensional images were evaluated at the independent interpreting workstation using the DynaCAD thin client. COMPARISON:  Mammogram dated September 29, 2022. Multiple prior breast MRIs, most recently dated September 23, 2022 FINDINGS: Breast composition: c. Heterogeneous fibroglandular tissue. Background parenchymal enhancement: Mild Right breast: Adjacent linear non mass enhancement in the slightly LATERAL and superior RIGHT breast, series 8, images 67 through 72, with the more LATERAL linear non mass enhancement measuring 13 mm in diameter (image 71) and the slightly more MEDIAL linear non mass enhancement measuring 18 mm (image 67). Left breast: Approximately 3.5 x 2.6 x 2.7 cm of linear, branching clumped non mass enhancement in the upper inner quadrant of the LEFT breast, in the area of prior biopsies/excisions and extending medially from the level of the biopsy clip (series 8, images 52 through 65, with clip on image 63). It is morphologically similar to previously biopsied non mass enhancement in this region which corresponded with both atypical lobular hyperplasia (on percutaneous biopsy) and DCIS (at the time of excision). There is edema throughout the LEFT breast in keeping with history of radiation therapy. Lymph nodes: No abnormal appearing lymph nodes. Ancillary findings:  None. IMPRESSION: 1. Linear, clumped non mass enhancement spanning up to  approximately 3.5 cm in the upper inner quadrant of the LEFT breast, similar in appearance and location to previously biopsied and excised non mass enhancement with results showing atypical lobular hyperplasia (on percutaneous biopsy) and DCIS (at the time of excision). Further characterization with repeat MRI guided biopsy is  recommended. 2. Two areas of adjacent, linear non mass enhancement in the upper-outer quadrant of the RIGHT breast which are indeterminate can likely be simultaneously biopsied given proximity. RECOMMENDATION: MRI guided biopsy of the LEFT breast x1. MRI guided biopsy of the RIGHT breast x1. BI-RADS CATEGORY  4: Suspicious. Electronically Signed   By: Norleen Croak M.D.   On: 03/28/2024 13:04       IMPRESSION/PLAN: 1. Stage IA, [id number], grade 2 invasive ductal carcinoma of the right breast. Dr. Dewey discusses the pathology findings and reviews the nature of early stage breast disease. She is contemplating surgical intervention options, and whether she wants breast conservation versus mastectomies. She has a pending Oncotype Dx score on her biopsy core to determine a role for systemic therapy. She is aware that if this is insufficient tissue for testing, this could be repeated on final pathology, or her prognostics retested. We discussed the consideration of external beam radiotherapy if she proceeds with breast conservation, or if nodal disease was noted at the time of surgery despite which surgical approach was taken for the breast. She is very conflicted about her options for reconstruction as she feels like she is leaning toward mastectomy for concerns that she can feel fullness in her left breast since her surgery, radiation, and baseline dense breast tissue which makes her question the validity of her surveillance imaging of the breast. Her concern also has to do with cosmetic symmetry for these same reasons. She is motivated to do the least invasive reconstruction if she decides to  proceed with bilateral mastectomies, and would prefer whichever option has the least likelihood for multiple revisions. She is also concerned about volume and symmetry of her left breast as well if she completes breast conservation and radiation on the right. She has also been wondering if she should get a second opinion for plastic surgery, and had previously heard about Dr. Arelia during her prior course of treatment for the left breast. I offered to refer her to have an additional opinion regarding reconstruction options. She is in agreement. We will follow up with Oncotype Dx score, her decision making for her surgical  treatment of her breast cancer, and her decision making with her plastic surgery team. We would be happy to see her back to discuss the role of radiotherapy if this is indicated based on her surgical choices.  2. 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  3. Prior genetic VUS. I will reach out to the genetic counselors to see if there are any updates she needs for her testing.    In a visit lasting 60 minutes, greater than 50% of the time was spent face to face discussing the patient's condition, in preparation for the discussion, and coordinating the patient's care.  The above documentation reflects my direct findings during this shared patient visit. Please see the separate note by Dr. Dewey on this date for the remainder of the patient's plan of care.     Donald KYM Husband, PAC       Donald KYM Husband, [facility]   **Disclaimer: This note was dictated with voice recognition software. Similar sounding words can inadvertently be transcribed and this note may contain transcription errors which may not have been corrected upon publication of note.**

## 2024-04-22 NOTE — Addendum Note (Signed)
 Encounter addended by: [name], MD on: 04/22/2024 10:54 AM  Actions taken: Clinical Note Signed

## 2024-04-25 ENCOUNTER — Other Ambulatory Visit: Payer: Self-pay | Admitting: General Surgery

## 2024-04-25 NOTE — Progress Notes (Signed)
 PROVIDER:  DONNICE CARLIN BURY, MD  MRN: [id number] DOB: 02/10/1966 DATE OF ENCOUNTER: 04/25/2024 Subjective     Chief Complaint: No chief complaint on file.     History of Present Illness: 58 y.o. female I know from  2022 where she underwent a lumpectomy for DCIS that was estrogen receptor and progesterone receptor positive.  This was intermediate.  She did not proceed with radiation or antiestrogen at that time as she had a family emergency with her father and she took care of him during that time.   Due to her risk she had undergone an MRI recently which shows C density breast. The right side is normal.  The lymph nodes are all normal.  There are in the left breast in the upper left breast there is some suspicious linear non-mass enhancement measuring 1.6 x 0.6 x 1.1 cm.  This has undergone a biopsy which shows this to be lobular neoplasia involving a small sclerotic lesion.  she had repeat mri that shows this area is larger now at 2.1x1.3 cm. It underwent a rebiopsy and is still alh. She is taking 5 mg tamoxifen  now since January. I did a seed guided excision of this area and it is grade II er/pr negative dcis with clear margins. She then got radiotherapy and remains on 10 mg qod of tam.  She then had f/u mri as planned. This showed c density breasts.  There is linear nme up to 3.5 cm in uiq of left breast.  This is similary to previous biopsy etc she has had.  This underwent biopsy again and is benign concordant with recommendation of f/u six month imaging.  There are two areas of adjacent nme in uoq of right breast.   These measure 13 and 18 mm This was biopsied and is grade II IDC. 40% er pos, pr neg, her 2 negative and Ki is 5%.   She has seen rad onc, plastics and is here to discuss decision. She is on antiestrogen   Review of Systems: A complete review of systems was obtained from the patient.  I have reviewed this information and discussed as appropriate with the patient.  See HPI  as well for other ROS.  Review of Systems  All other systems reviewed and are negative.     Medical History: Past Medical History:  Diagnosis Date  . History of cancer   . Thyroid  disease     Patient Active Problem List  Diagnosis  . Carcinoma of upper-outer quadrant of right female breast (CMS/HHS-HCC)    Past Surgical History:  Procedure Laterality Date  . MASTECTOMY PARTIAL / LUMPECTOMY  12/18/2020  . MASTECTOMY PARTIAL / LUMPECTOMY Left 02/06/2022     No Known Allergies  Current Outpatient Medications on File Prior to Visit  Medication Sig Dispense Refill  . acyclovir (ZOVIRAX) 5 % ointment acyclovir 5 % topical ointment  APPLY TO THE AFFECTED AREA(S) BY TOPICAL ROUTE EVERY 3 HOURS 6 TIMES PER DAY x 7 days    . b complex vitamins capsule Take 1 capsule by mouth once daily    . benzonatate  (TESSALON ) 100 MG capsule benzonatate  100 mg capsule    . biotin 10 mg Tab Take by mouth    . cholecalciferol (VITAMIN D3) 1000 unit tablet Take by mouth    . fluticasone  propionate (FLONASE ) 50 mcg/actuation nasal spray fluticasone  propionate 50 mcg/actuation nasal spray,suspension  SHAKE LIQUID AND USE 1 SPRAY IN EACH NOSTRIL DAILY    . L. acidophilus/Bifid. animalis  32 billion cell Cap Take by mouth once daily    . levothyroxine  (SYNTHROID ) 25 MCG tablet levothyroxine  25 mcg tablet    . lidocaine  (XYLOCAINE ) 2 % jelly lidocaine  HCl 2 % mucosal jelly    . liothyronine (CYTOMEL) 5 MCG tablet TAKE 1 TAB EVERY MORNING X 7 DAYS, IF WELL TOLERATED THEN INCREASE TO 2 TABS EVERY MORNING    . loratadine 10 mg Chew Claritin 10 mg chewable tablet  1 a day    . meloxicam (MOBIC) 15 MG tablet Take 1 tablet (15 mg total) by mouth 3 (three) times daily    . polyethylene glycol (MIRALAX) powder Take by mouth    . predniSONE 5 mg DsPk as directed    . sertraline  (ZOLOFT ) 50 MG tablet sertraline  50 mg tablet    . tamoxifen  (NOLVADEX ) 10 MG tablet Take 10 mg by mouth every other day    .  terconazole (TERAZOL 7) 0.4 % vaginal cream terconazole 0.4 % vaginal cream  INSERT 1 APPLICATORFUL VAGINALLY EVERY DAY FOR 7 DAYS    . traMADoL  (ULTRAM ) 50 mg tablet TAKE 2 TABLETS BY MOUTH EVERY 6 HOURS AS NEEDED    . valACYclovir  (VALTREX ) 1000 MG tablet valacyclovir  1 gram tablet  take 1 tablet po bid x 5 days prn    . vitamin E 1000 UNIT capsule Take by mouth     No current facility-administered medications on file prior to visit.    Family History  Problem Relation Age of Onset  . Hyperlipidemia (Elevated cholesterol) Mother   . Hyperlipidemia (Elevated cholesterol) Father   . High blood pressure (Hypertension) Sister   . Hyperlipidemia (Elevated cholesterol) Sister      Social History   Tobacco Use  Smoking Status Former  . Types: Cigarettes  Smokeless Tobacco Never     Social History   Socioeconomic History  . Marital status: Divorced  Tobacco Use  . Smoking status: Former    Types: Cigarettes  . Smokeless tobacco: Never  Substance and Sexual Activity  . Alcohol use: Yes  . Drug use: Never   Social Drivers of Health   Housing Stability: Unknown (04/08/2024)   Housing Stability Vital Sign   . Homeless in the Last Year: No    Objective:    There were no vitals filed for this visit.  There is no height or weight on file to calculate BMI.  Physical Exam Constitutional:      Appearance: Normal appearance.  Chest:  Breasts:    Right: No inverted nipple, mass or nipple discharge.     Left: No inverted nipple, mass or nipple discharge.  Lymphadenopathy:     Upper Body:     Right upper body: No supraclavicular or axillary adenopathy.     Left upper body: No supraclavicular or axillary adenopathy.  Neurological:     Mental Status: She is alert.         Assessment and Plan:    Bilateral SSM with right ax sn biopsy Combination with expander placement with plastic surgery  We again went over the differences between lumpectomy as well as mastectomy.   She is elected to proceed with bilateral mastectomy with right axillary sentinel lymph node biopsy.  She is seeing plastic surgery and will have expanders placed at the same time.  We again discussed the risks as well as the recovery as well as the outcomes for her cancer based on this.  We are going to schedule her after November 20 per her  request.  I do not think she is a candidate for nipple sparing mastectomies we will plan skin-sparing mastectomy.  She does have an Oncotype pending that we will follow-up prior to surgery as well. MATTHEW CARLIN BURY, MD

## 2024-04-25 NOTE — Addendum Note (Signed)
 Addended by: EUSTACIO POUR on: 04/25/2024 07:43 PM   Modules accepted: Orders

## 2024-04-26 ENCOUNTER — Encounter: Payer: Self-pay | Admitting: Radiation Oncology

## 2024-04-26 NOTE — Addendum Note (Signed)
 Addended by: EUSTACIO POUR on: 04/26/2024 09:24 AM   Modules accepted: Orders

## 2024-04-27 ENCOUNTER — Encounter (HOSPITAL_COMMUNITY): Payer: Self-pay

## 2024-04-27 ENCOUNTER — Encounter: Payer: Self-pay | Admitting: Hematology

## 2024-04-28 ENCOUNTER — Encounter: Payer: Self-pay | Admitting: *Deleted

## 2024-05-01 NOTE — Progress Notes (Deleted)
 Patient Care Team: [name], MD as PCP - General (Family Medicine) [name], RN as Oncology Nurse Navigator [name], MD as Consulting Physician (General Surgery) [name], MD as Consulting Physician (Hematology) [name], MD as Consulting Physician (Radiation Oncology) Burton, Lacie K, NP as Nurse Practitioner (Nurse Practitioner) [name], RN as Oncology Nurse Navigator  Clinic Day:  05/01/2024  Referring physician: [name], MD  I connected with Cy KATHEE Montclair on 05/01/24 at  9:30 AM EDT by @[email or web address] @ and verified that I am speaking with the correct person using two identifiers.   I discussed the limitations, risks, security and privacy concerns of performing an evaluation and management service by telemedicine and the availability of in-person appointments. I also discussed with the patient that there may be a patient responsible charge related to this service. The patient expressed understanding and agreed to proceed.   Other persons participating in the visit and their role in the encounter: ***   Patient's location: ***  Provider's location: ***   ASSESSMENT & PLAN:   Assessment & Plan: Malignant neoplasm of upper-outer quadrant of right female breast (HCC)  stage I, ER 40%+/PR-/HER2- Newly diagnosed invasive ductal carcinoma of the right breast, stage I, with a 1.3 cm ER+ (40% positive), PR-, HER2- tumor. Negative lymphovascular invasion and lymph nodes on images. Grade 2 tumor with KI-67 at 5%, indicating low proliferation, however I am concerned this could be aggressive cancer due to the weak to moderate ER positivity in 40% tumor cells.  -Discussed lumpectomy with radiation versus mastectomy, noting similar local recurrence outcomes.  Patient is leaning towards double mastectomy due to previous left breast DCIS and her concern for future breast cancer.  However she is not able to have implant in the left breast  due to previous radiation.  She is also concerned about flap reconstruction.   - Oncotype scoring on her surgical or biopsy sample was recommended to determine if she needs chemotherapy.  We discussed potential side effects including cytopenias, diarrhea, fatigue, alopecia, infection risk, and neuropathy. Consideration of port for chemotherapy administration. Oncotype score <=25 indicates low risk, >=26 suggests high risk and chemotherapy benefit. - Order oncotype test on biopsy sample, so we can decide if she needs a port during her breast surgery. - Switch from tamoxifen  to anastrozole due to postmenopausal status and invasive cancer. -We had a lengthy discussion about her lumpectomy versus mastectomy, at the end of the consultation, I think she is leaning towards double mastectomy, and may not want reconstruction. -Oncotype score is 20.     The patient understands the plans discussed today and is in agreement with them.  She knows to contact our office if she develops concerns prior to her next appointment.  I provided *** minutes of face-to-face time during this encounter and > 50% was spent counseling as documented under my assessment and plan.    Powell FORBES Lessen, NP  [facility] - A DEPT OF [facility] [address] [place] [address] Dept: [phone] Dept Fax: [phone]   No orders of the defined types were placed in this encounter.     CHIEF COMPLAINT:  CC: Newly diagnosed right breast cancer; history of DCIS of left breast  Current Treatment: Tamoxifen  10 mg daily, changed to anastrozole daily on 04/14/2024  INTERVAL HISTORY:  Sabrinia is here today for repeat clinical assessment.  She was last seen on 04/14/2024  by Dr. Lanny. She denies fevers or chills. She denies pain. Her appetite is good. Her weight {Weight change:10426}.  I have reviewed the past medical history, past surgical history, social history  and family history with the patient and they are unchanged from previous note.  ALLERGIES:  is allergic to short ragweed pollen ext.  MEDICATIONS:  Current Outpatient Medications  Medication Sig Dispense Refill   anastrozole (ARIMIDEX) 1 MG tablet Take 1 tablet (1 mg total) by mouth daily. 30 tablet 1   BIOTIN MAXIMUM PO Take by mouth.     Calcium Carb-Cholecalciferol (CALCIUM 600 + D PO) Take by mouth.     cholecalciferol (VITAMIN D3) 25 MCG (1000 UNIT) tablet Take 1,000 Units by mouth daily.     EC-RX TESTOSTERONE TD 2 %. (Patient not taking: Reported on 04/21/2024)     LAVENDER OIL PO Take by mouth.     levothyroxine  (SYNTHROID ) 25 MCG tablet TAKE 1 TABLET BY MOUTH EVERY DAY BEFORE BREAKFAST 30 tablet 3   magnesium gluconate (MAGONATE) 500 (27 Mg) MG TABS tablet Take 500 mg by mouth in the morning and at bedtime.     Menaquinone-7 (VITAMIN K2 PO) Take by mouth.     Multiple Vitamin (MULTI-VITAMIN) tablet Take by mouth.     Omega-3 Fatty Acids (OMEGA 3 FISH OIL PO) Take by mouth.     Probiotic Product (ALIGN PO) Take by mouth.     tamoxifen  (NOLVADEX ) 10 MG tablet TAKE 1 TABLET BY MOUTH EVERY OTHER DAY (Patient not taking: Reported on 04/21/2024) 15 tablet 5   No current facility-administered medications for this visit.    HISTORY OF PRESENT ILLNESS:   Oncology History  Ductal carcinoma in situ of left breast  10/16/2020 Pathology Results   Breast, left, needle core biopsy, UOQ posterior - DUCTAL CARCINOMA IN SITU WITH CALCIFICATIONS, INTERMEDIATE NUCLEAR GRADE  PROGNOSTIC INDICATORS Results: IMMUNOHISTOCHEMICAL AND MORPHOMETRIC ANALYSIS PERFORMED MANUALLY Estrogen Receptor: 95%, POSITIVE, STRONG STAINING INTENSITY Progesterone Receptor: 20%, POSITIVE, STRONG STAINING INTENSITY   11/09/2020 Initial Diagnosis   Ductal carcinoma in situ of left breast   11/16/2020 Genetic Testing   Negative hereditary cancer genetic testing: no pathogenic variants detected in [facility] BRCAPlus  Panel.  The report date is Nov 16, 2020. The BRCAplus panel offered by [employer] and includes sequencing and deletion/duplication analysis for the following 8 genes: ATM, BRCA1, BRCA2, CDH1, CHEK2, PALB2, PTEN, and TP53.    Negative hereditary cancer genetic testing: no pathogenic variants detected in [employer] CustomNext-Cancer +RNAinsight Panel.  Variant of uncertain significance detected in CDKN2A at c.440C>T (p.A147V). The report date is Nov 26, 2020.   The CustomNext-Cancer+RNAinsight panel offered by [employer] includes sequencing and rearrangement analysis for the following 47 genes:  APC, ATM, AXIN2, BARD1, BMPR1A, BRCA1, BRCA2, BRIP1, CDH1, CDK4, CDKN2A, CHEK2, DICER1, EPCAM, GREM1, HOXB13, MEN1, MLH1, MSH2, MSH3, MSH6, MUTYH, NBN, NF1, NF2, NTHL1, PALB2, PMS2, POLD1, POLE, PTEN, RAD51C, RAD51D, RECQL, RET, SDHA, SDHAF2, SDHB, SDHC, SDHD, SMAD4, SMARCA4, STK11, TP53, TSC1, TSC2, and VHL.  RNA data is routinely analyzed for use in variant interpretation for all genes.    12/18/2020 Surgery   Left Lumpectomy  FINAL MICROSCOPIC DIAGNOSIS:   A. BREAST, LEFT, LUMPECTOMY:  - Ductal carcinoma in situ, intermediate grade, involving areas of  sclerosing adenosis, see comment  - Resection margins are negative for DCIS; closest is the posterior  margin at 3 mm  - Biopsy site changes  - Calcifications  - See oncology table   B. BREAST, LEFT  ADDITIONAL LATERAL MARGIN, EXCISION:  - Fibrocystic change with sclerosing adenosis and calcifications  - Negative for carcinoma   C. BREAST, LEFT ADDITIONAL POSTERIOR MARGIN, EXCISION:  - Benign breast parenchyma, negative for carcinoma    12/18/2020 Cancer Staging   Staging form: Breast, AJCC 8th Edition - Pathologic stage from 12/18/2020: Stage Unknown (pTis (DCIS), pNX, cM0, G2, ER+, PR+, HER2: Not Assessed) - Signed by [name], NP on 06/27/2021 Stage prefix: Initial diagnosis Histologic grading system: 3 grade system   06/27/2021  Survivorship   SCP delivered by Lacie Burton, NP   07/2021 -  Anti-estrogen oral therapy   Pending start of chemoprevention with tamoxifen  in 07/2021    12/19/2021 Imaging   IMPRESSION: Breast MRI impression:  1. Area of non mass enhancement in the left breast has increased in size from the prior MRI. It lies just below cylinder shaped biopsy clip. Given the position of the post biopsy marker clip and the interval increase in size of this enhancement, re-biopsy is recommended. 2. No other areas of abnormal enhancement in the left breast. 3. No evidence of right breast malignancy.   RECOMMENDATION: 1. MRI guided core needle biopsy of the area of non mass enhancement in the left breast.   12/27/2021 Pathology Results   Diagnosis Breast, left, needle core biopsy, upper inner quadrant, cylinder clip - LOBULAR NEOPLASIA (ATYPICAL LOBULAR HYPERPLASIA). - FIBROCYSTIC CHANGES WITH USUAL DUCTAL HYPERPLASIA AND CALCIFICATIONS. - SEE NOTE.   02/06/2022 Surgery   Left breast lumpectomy by Dr. [name]   02/06/2022 Pathology Results   FINAL MICROSCOPIC DIAGNOSIS: A. BREAST, LEFT, LUMPECTOMY: Ductal carcinoma in-situ, apocrine type with clear margins of resection. Radial sclerosing lesion with extensive atypical lobular hyperplasia in the immediate vicinity and elsewhere in the breast. Multiple foci of sclerosing adenosis. Invasive carcinoma is not identified.  Estrogen receptors and progesterone receptors both negative   03/18/2022 -  Radiation Therapy   Adjuvant radiation by Dr. Dewey, plan to complete 04/15/2022   Malignant neoplasm of upper-outer quadrant of right female breast (HCC)  04/08/2024 Initial Diagnosis   Malignant neoplasm of upper-outer quadrant of right female breast (HCC)   04/21/2024 Cancer Staging   Staging form: Breast, AJCC 8th Edition - Clinical stage from 04/21/2024: Stage IA (cT1c, cN0, cM0, G2, ER+, PR-, HER2-) - Signed by [name], PA-C on  04/21/2024 Stage prefix: Initial diagnosis Method of lymph node assessment: Clinical Histologic grading system: 3 grade system       REVIEW OF SYSTEMS:   Constitutional: Denies fevers, chills or abnormal weight loss Eyes: Denies blurriness of vision Ears, nose, mouth, throat, and face: Denies mucositis or sore throat Respiratory: Denies cough, dyspnea or wheezes Cardiovascular: Denies palpitation, chest discomfort or lower extremity swelling Gastrointestinal:  Denies nausea, heartburn or change in bowel habits Skin: Denies abnormal skin rashes Lymphatics: Denies new lymphadenopathy or easy bruising Neurological:Denies numbness, tingling or new weaknesses Behavioral/Psych: Mood is stable, no new changes  All other systems were reviewed with the patient and are negative.   VITALS:  Last menstrual period 07/07/2018.  Wt Readings from Last 3 Encounters:  04/21/24 174 lb 3.2 oz (79 kg)  04/14/24 175 lb (79.4 kg)  04/13/24 174 lb (78.9 kg)    There is no height or weight on file to calculate BMI.  Performance status (ECOG): {CHL ONC [id number]  PHYSICAL EXAM:   GENERAL:alert, no distress and comfortable SKIN: skin color, texture, turgor are normal, no rashes or significant lesions EYES: normal, Conjunctiva are pink  and non-injected, sclera clear OROPHARYNX:no exudate, no erythema and lips, buccal mucosa, and tongue normal  NECK: supple, thyroid  normal size, non-tender, without nodularity LYMPH:  no palpable lymphadenopathy in the cervical, axillary or inguinal LUNGS: clear to auscultation and percussion with normal breathing effort HEART: regular rate & rhythm and no murmurs and no lower extremity edema ABDOMEN:abdomen soft, non-tender and normal bowel sounds Musculoskeletal:no cyanosis of digits and no clubbing  NEURO: alert & oriented x 3 with fluent speech, no focal motor/sensory deficits  LABORATORY DATA:  I have reviewed the data as listed    Component Value  Date/Time   NA 139 01/29/2024 1428   NA 140 05/15/2020 0857   K 4.0 01/29/2024 1428   CL 102 01/29/2024 1428   CO2 27 01/29/2024 1428   GLUCOSE 87 01/29/2024 1428   BUN 12 01/29/2024 1428   BUN 13 05/15/2020 0857   CREATININE 0.61 01/29/2024 1428   CALCIUM 9.4 01/29/2024 1428   PROT 6.9 01/29/2024 1428   PROT 7.5 05/15/2020 0857   ALBUMIN 4.4 09/24/2023 0834   ALBUMIN 4.8 05/15/2020 0857   AST 23 01/29/2024 1428   AST 18 09/24/2023 0834   ALT 35 (H) 01/29/2024 1428   ALT 26 09/24/2023 0834   ALKPHOS 41 09/24/2023 0834   BILITOT 0.5 01/29/2024 1428   BILITOT 0.3 09/24/2023 0834   GFRNONAA >60 09/24/2023 0834   GFRAA 110 05/15/2020 0857    No results found for: SPEP, UPEP  Lab Results  Component Value Date   WBC 4.6 01/29/2024   NEUTROABS 2,696 01/29/2024   HGB 13.3 01/29/2024   HCT 40.5 01/29/2024   MCV 96.4 01/29/2024   PLT 201 01/29/2024      Chemistry      Component Value Date/Time   NA 139 01/29/2024 1428   NA 140 05/15/2020 0857   K 4.0 01/29/2024 1428   CL 102 01/29/2024 1428   CO2 27 01/29/2024 1428   BUN 12 01/29/2024 1428   BUN 13 05/15/2020 0857   CREATININE 0.61 01/29/2024 1428      Component Value Date/Time   CALCIUM 9.4 01/29/2024 1428   ALKPHOS 41 09/24/2023 0834   AST 23 01/29/2024 1428   AST 18 09/24/2023 0834   ALT 35 (H) 01/29/2024 1428   ALT 26 09/24/2023 0834   BILITOT 0.5 01/29/2024 1428   BILITOT 0.3 09/24/2023 0834       RADIOGRAPHIC STUDIES: I have personally reviewed the radiological images as listed and agreed with the findings in the report. MR LT BREAST BX W LOC DEV 1ST LESION IMAGE BX SPEC MR GUIDE Addendum Date: 04/07/2024 ADDENDUM REPORT: 04/07/2024 13:21 ADDENDUM: PATHOLOGY revealed: Site 1. Breast, right, needle core biopsy, upper slight outer posterior, cylinder clip - INVASIVE DUCTAL CARCINOMA, - OVERALL GRADE: 2 -- LYMPHOVASCULAR INVASION: NOT IDENTIFIED - CANCER LENGTH: 0.5 CM - CALCIFICATIONS: PRESENT, WITH  BENIGN BREAST PARENCHYMA -- OTHER FINDINGS: NONE Pathology results are CONCORDANT with imaging findings, per Dr. Curtistine Noble. PATHOLOGY revealed: Site 2. Breast, left, needle core biopsy, upper central anterior, cylinder clip - MILD FIBROCYSTIC CHANGE WITH CALCIFICATIONS - NEGATIVE FOR CARCINOMA. Pathology results are CONCORDANT with imaging findings, per Dr. Curtistine Noble. Pathology results and recommendations below were discussed with patient by telephone on 04/07/24 by Rock Hover RN. Patient reported biopsy site within normal limits with slight tenderness at the site. Post biopsy care instructions were reviewed, questions were answered and my direct phone number was provided to patient. Patient was instructed to call [facility] if any concerns or questions arise related to the biopsy. RECOMMENDATION: 1. Surgical and oncological consultation for site 1 only. On 04/07/2024, Rock Hover RN notified patient's surgeon (Dr. Donnice Bury) and oncologist (Dr. Onita Mattock) and team with request for patient appointment. 2. Recommend 6 month follow-up LEFT breast diagnostic mammogram (and ultrasound if deemed necessary) to ensure stability of biopsied area. Patient informed a reminder notice will be sent regarding this appointment and she may need to call mammography site to schedule this appointment. Pathology results reported by Mliss Molt RN 04/07/2024. Electronically Signed   By: Curtistine Noble   On: 04/07/2024 13:21   Result Date: 04/07/2024 CLINICAL DATA:  58 year old female here for bilateral breast MRI biopsy. History of DCIS x2 in the left breast, most recently August 2023. EXAM: MRI GUIDED CORE NEEDLE BIOPSY OF THE LEFT AND RIGHT BREAST TECHNIQUE: Multiplanar, multisequence MR imaging of both breasts was performed both before and after administration of intravenous contrast. CONTRAST:  7.5 ml Vueway  COMPARISON:  Mammogram dated September 29, 2022. Multiple prior breast MRIs, most  recently dated 03/26/2024. FINDINGS: I met with the patient, and we discussed the procedure of MRI guided biopsy, including risks, benefits, and alternatives. Specifically, we discussed the risks of infection, bleeding, tissue injury, clip migration, and inadequate sampling. Informed, written consent was given. The usual time out protocol was performed immediately prior to the procedure. Left breast: Using sterile technique, 1% Lidocaine  with and without epinephrine , MRI guidance, and a 9 gauge vacuum assisted device, biopsy was performed of the area of non mass enhancement about the upper central breast, anterior third using a lateral approach. At the conclusion of the procedure, a cylinder tissue marker clip was deployed into the biopsy cavity. Right breast: Using sterile technique, 1% Lidocaine  with and without epinephrine , MRI guidance, and a 9 gauge vacuum assisted device, biopsy was performed of the area of non mass enhancement about the upper slightly outer breast, posterior third using a lateral approach. At the conclusion of the procedure, a cylinder tissue marker clip was deployed into the biopsy cavity. Follow-up 2-view mammogram was performed and dictated separately. IMPRESSION: MRI guided biopsy of both breasts as above. No apparent complications. Electronically Signed: By: Curtistine Noble On: 04/06/2024 10:21   MR RT BREAST BX W LOC DEV 1ST LESION IMAGE BX SPEC MR GUIDE Addendum Date: 04/07/2024 ADDENDUM REPORT: 04/07/2024 13:21 ADDENDUM: PATHOLOGY revealed: Site 1. Breast, right, needle core biopsy, upper slight outer posterior, cylinder clip - INVASIVE DUCTAL CARCINOMA, - OVERALL GRADE: 2 -- LYMPHOVASCULAR INVASION: NOT IDENTIFIED - CANCER LENGTH: 0.5 CM - CALCIFICATIONS: PRESENT, WITH BENIGN BREAST PARENCHYMA -- OTHER FINDINGS: NONE Pathology results are CONCORDANT with imaging findings, per Dr. Curtistine Noble. PATHOLOGY revealed: Site 2. Breast, left, needle core biopsy, upper central  anterior, cylinder clip - MILD FIBROCYSTIC CHANGE WITH CALCIFICATIONS - NEGATIVE FOR CARCINOMA. Pathology results are CONCORDANT with imaging findings, per Dr. Curtistine Noble. Pathology results and recommendations below were discussed with patient by telephone on 04/07/24 by Rock Hover RN. Patient reported biopsy site within normal limits with slight tenderness at the site. Post biopsy care instructions were reviewed, questions were answered and my direct phone number was provided to patient. Patient was instructed to call [facility] if any concerns or questions arise related to the biopsy. RECOMMENDATION: 1. Surgical and oncological consultation for site 1 only. On 04/07/2024, Rock Hover RN notified patient's surgeon (Dr. Donnice Bury) and oncologist (Dr. Onita Mattock) and team with  request for patient appointment. 2. Recommend 6 month follow-up LEFT breast diagnostic mammogram (and ultrasound if deemed necessary) to ensure stability of biopsied area. Patient informed a reminder notice will be sent regarding this appointment and she may need to call mammography site to schedule this appointment. Pathology results reported by Mliss Molt RN 04/07/2024. Electronically Signed   By: Curtistine Noble   On: 04/07/2024 13:21   Result Date: 04/07/2024 CLINICAL DATA:  58 year old female here for bilateral breast MRI biopsy. History of DCIS x2 in the left breast, most recently August 2023. EXAM: MRI GUIDED CORE NEEDLE BIOPSY OF THE LEFT AND RIGHT BREAST TECHNIQUE: Multiplanar, multisequence MR imaging of both breasts was performed both before and after administration of intravenous contrast. CONTRAST:  7.5 ml Vueway  COMPARISON:  Mammogram dated September 29, 2022. Multiple prior breast MRIs, most recently dated 03/26/2024. FINDINGS: I met with the patient, and we discussed the procedure of MRI guided biopsy, including risks, benefits, and alternatives. Specifically, we discussed the risks of infection,  bleeding, tissue injury, clip migration, and inadequate sampling. Informed, written consent was given. The usual time out protocol was performed immediately prior to the procedure. Left breast: Using sterile technique, 1% Lidocaine  with and without epinephrine , MRI guidance, and a 9 gauge vacuum assisted device, biopsy was performed of the area of non mass enhancement about the upper central breast, anterior third using a lateral approach. At the conclusion of the procedure, a cylinder tissue marker clip was deployed into the biopsy cavity. Right breast: Using sterile technique, 1% Lidocaine  with and without epinephrine , MRI guidance, and a 9 gauge vacuum assisted device, biopsy was performed of the area of non mass enhancement about the upper slightly outer breast, posterior third using a lateral approach. At the conclusion of the procedure, a cylinder tissue marker clip was deployed into the biopsy cavity. Follow-up 2-view mammogram was performed and dictated separately. IMPRESSION: MRI guided biopsy of both breasts as above. No apparent complications. Electronically Signed: By: Curtistine Noble On: 04/06/2024 10:21   MM CLIP PLACEMENT RIGHT Result Date: 04/06/2024 CLINICAL DATA:  58 year old female status post bilateral MRI biopsy. EXAM: 3D DIAGNOSTIC BILATERAL MAMMOGRAM POST MRI BIOPSY COMPARISON:  Previous exam(s). ACR Breast Density Category d: The breasts are extremely dense, which lowers the sensitivity of mammography. FINDINGS: 3D Mammographic images were obtained following MRI guided biopsy of non mass enhancement in both breasts. The biopsy marking clips are in expected position at the site of biopsy. IMPRESSION: 1. Appropriate positioning of the cylinder shaped biopsy marking clip at the site of biopsy in the left upper central anterior breast. 2. Appropriate positioning of the cylinder shaped biopsy marking clip at the site of biopsy in the right upper slightly outer posterior breast. Final  Assessment: Post Procedure Mammograms for Marker Placement Electronically Signed   By: Curtistine Noble   On: 04/06/2024 10:25   MM CLIP PLACEMENT LEFT Result Date: 04/06/2024 CLINICAL DATA:  58 year old female status post bilateral MRI biopsy. EXAM: 3D DIAGNOSTIC BILATERAL MAMMOGRAM POST MRI BIOPSY COMPARISON:  Previous exam(s). ACR Breast Density Category d: The breasts are extremely dense, which lowers the sensitivity of mammography. FINDINGS: 3D Mammographic images were obtained following MRI guided biopsy of non mass enhancement in both breasts. The biopsy marking clips are in expected position at the site of biopsy. IMPRESSION: 1. Appropriate positioning of the cylinder shaped biopsy marking clip at the site of biopsy in the left upper central anterior breast. 2. Appropriate positioning of the cylinder shaped biopsy marking clip  at the site of biopsy in the right upper slightly outer posterior breast. Final Assessment: Post Procedure Mammograms for Marker Placement Electronically Signed   By: Curtistine Noble   On: 04/06/2024 10:25

## 2024-05-01 NOTE — Assessment & Plan Note (Deleted)
 stage I, ER 40%+/PR-/HER2- Newly diagnosed invasive ductal carcinoma of the right breast, stage I, with a 1.3 cm ER+ (40% positive), PR-, HER2- tumor. Negative lymphovascular invasion and lymph nodes on images. Grade 2 tumor with KI-67 at 5%, indicating low proliferation, however I am concerned this could be aggressive cancer due to the weak to moderate ER positivity in 40% tumor cells.  -Discussed lumpectomy with radiation versus mastectomy, noting similar local recurrence outcomes.  Patient is leaning towards double mastectomy due to previous left breast DCIS and her concern for future breast cancer.  However she is not able to have implant in the left breast due to previous radiation.  She is also concerned about flap reconstruction.   - Oncotype scoring on her surgical or biopsy sample was recommended to determine if she needs chemotherapy.  We discussed potential side effects including cytopenias, diarrhea, fatigue, alopecia, infection risk, and neuropathy. Consideration of port for chemotherapy administration. Oncotype score <=25 indicates low risk, >=26 suggests high risk and chemotherapy benefit. - Order oncotype test on biopsy sample, so we can decide if she needs a port during her breast surgery. - Switch from tamoxifen  to anastrozole due to postmenopausal status and invasive cancer. -We had a lengthy discussion about her lumpectomy versus mastectomy, at the end of the consultation, I think she is leaning towards double mastectomy, and may not want reconstruction. -Oncotype score is 20.

## 2024-05-02 ENCOUNTER — Inpatient Hospital Stay: Admitting: Nurse Practitioner

## 2024-05-02 DIAGNOSIS — C50411 Malignant neoplasm of upper-outer quadrant of right female breast: Secondary | ICD-10-CM

## 2024-05-10 ENCOUNTER — Other Ambulatory Visit: Payer: Self-pay | Admitting: Hematology

## 2024-05-24 ENCOUNTER — Other Ambulatory Visit: Payer: Self-pay | Admitting: Family Medicine

## 2024-05-24 DIAGNOSIS — E039 Hypothyroidism, unspecified: Secondary | ICD-10-CM

## 2024-06-13 ENCOUNTER — Ambulatory Visit: Payer: Self-pay

## 2024-06-13 NOTE — Telephone Encounter (Signed)
  FYI Only or Action Required?: FYI only for provider: appointment scheduled on 06/14/2024.  Patient was last seen in primary care on 01/29/2024 by [name], MD.  Called Nurse Triage reporting Diarrhea.  Symptoms began a week ago.  Interventions attempted: Dietary changes.  Symptoms are: unchanged.  Triage Disposition: See Physician Within 24 Hours  Patient/caregiver understands and will follow disposition?: Yes  Message from Alfonso ORN sent at 06/13/2024  4:18 PM EST  Reason for Triage: travelers diarrhea after came  back from [facility] [phone] (M) requesting medication    Reason for Disposition  [1] SEVERE diarrhea (e.g., 7 or more times / day more than normal) AND [2] present > 24 hours (1 day)  Answer Assessment - Initial Assessment Questions Patient is scheduled for double mastectomy next week and is concerned for her surgery  1. DIARRHEA SEVERITY: How bad is the diarrhea? How many more stools have you had in the past 24 hours than normal?      At least six times in the past 24 hours, after every meal and inbetween 2. ONSET: When did the diarrhea begin?      One week ago 3. STOOL DESCRIPTION:  How loose or watery is the diarrhea? What is the stool color? Is there any blood or mucous in the stool?     watery 4. VOMITING: Are you also vomiting? If Yes, ask: How many times in the past 24 hours?      denies 5. ABDOMEN PAIN: Are you having any abdomen pain? If Yes, ask: What does it feel like? (e.g., crampy, dull, intermittent, constant)      Cramping to upper abdomen 6. ABDOMEN PAIN SEVERITY: If present, ask: How bad is the pain?  (e.g., Scale 1-10; mild, moderate, or severe)     3/10 7. ORAL INTAKE: If vomiting, Have you been able to drink liquids? How much liquids have you had in the past 24 hours?     N/a 8. HYDRATION: Any signs of dehydration? (e.g., dry mouth [not just dry lips], too weak to stand, dizziness, new weight loss) When  did you last urinate?     denies 9. EXPOSURE: Have you traveled to a foreign country recently? Have you been exposed to anyone with diarrhea? Could you have eaten any food that was spoiled?     Recently returned from [facility] 10. ANTIBIOTIC USE: Are you taking antibiotics now or have you taken antibiotics in the past 2 months?       denies 11. OTHER SYMPTOMS: Do you have any other symptoms? (e.g., fever, blood in stool)       denies  Protocols used: Diarrhea-A-AH

## 2024-06-14 ENCOUNTER — Encounter: Payer: Self-pay | Admitting: Family Medicine

## 2024-06-14 ENCOUNTER — Ambulatory Visit: Admitting: Family Medicine

## 2024-06-14 VITALS — BP 122/60 | HR 72 | Temp 98.4°F | Ht 64.0 in | Wt 175.0 lb

## 2024-06-14 DIAGNOSIS — A09 Infectious gastroenteritis and colitis, unspecified: Secondary | ICD-10-CM

## 2024-06-14 MED ORDER — AZITHROMYCIN 500 MG PO TABS: 1000.0000 mg | ORAL_TABLET | Freq: Once | ORAL | 0 refills | Status: AC

## 2024-06-14 NOTE — Progress Notes (Signed)
 Acute Office Visit  Subjective:     Patient ID: Tara Yates, female    DOB: 09-26-1965, 58 y.o.   MRN: [id number]  Chief Complaint  Patient presents with   Diarrhea    X6 days, denies trial of OTC medication, recently returned from trip to [place]    Diarrhea   Discussed the use of AI scribe software for clinical note transcription with the patient, who gave verbal consent to proceed.  History of Present Illness   Tara Yates is a 58 year old female with breast cancer who presents with diarrhea after returning from [facility].  She developed watery diarrhea during a six-day trip to [facility], starting the day after a drink with coconut milk. Her husband had similar symptoms starting one day earlier. She has multiple loose stools daily, including between meals, with initial abdominal pain that has improved. She denies fever, chills, blood in stool, nausea, or vomiting. She has not used Imodium due to concern for constipation and its effect on her upcoming surgery. She is drinking about eighty ounces of water  daily.  She has breast cancer and is scheduled for lumpectomy and double mastectomy in one week. She takes anastrozole  and levothyroxine  and stopped other supplements before surgery.        Review of Systems  Gastrointestinal:  Positive for diarrhea.  All other systems reviewed and are negative.       Objective:    BP 122/60   Pulse 72   Temp 98.4 F (36.9 C) (Oral)   Ht 5' 4 (1.626 m)   Wt 175 lb (79.4 kg)   LMP 07/07/2018   SpO2 99%   BMI 30.04 kg/m    Physical Exam Vitals reviewed.  Constitutional:      Appearance: Normal appearance.  Cardiovascular:     Rate and Rhythm: Normal rate and regular rhythm.  Pulmonary:     Effort: Pulmonary effort is normal.  Abdominal:     General: Bowel sounds are normal. There is no distension.     Tenderness: There is no abdominal tenderness. There is no guarding or rebound.  Neurological:     Mental  Status: She is alert.     No results found for any visits on 06/14/24.      Assessment & Plan:   Problem List Items Addressed This Visit   None Visit Diagnoses       Traveler's diarrhea    -  Primary      Assessment and Plan    Traveler's diarrhea Likely due to exposure to pathogens while traveling in [place]. Symptoms include watery diarrhea multiple times a day, with initial abdominal pain. No fever, chills, blood in stool, nausea, or vomiting. Symptoms began after consuming coconut milk. Husband also experienced similar symptoms, suggesting a common exposure. Symptoms are improving, indicating likely resolution without intervention. Differential includes viral or parasitic infection, but less likely given symptomatology and travel history. Ninety percent of cases resolve spontaneously. Azithromycin  is considered if symptoms persist or worsen, especially with upcoming surgery. - Advised BRAT diet to manage symptoms. - Prescribed azithromycin  500 mg, two tablets as a single dose, if symptoms persist or worsen, especially with upcoming surgery. - Recommended taking Pepto Bismol daily during future travel to prevent traveler's diarrhea.       Meds ordered this encounter  Medications   azithromycin  (ZITHROMAX ) 500 MG tablet    Sig: Take 2 tablets (1,000 mg total) by mouth once for 1 dose.  Dispense:  2 tablet    Refill:  0    No follow-ups on file.  Heron CHRISTELLA Sharper, MD

## 2024-06-15 NOTE — Telephone Encounter (Signed)
 Patent seen 12/9

## 2024-06-17 ENCOUNTER — Ambulatory Visit

## 2024-06-17 ENCOUNTER — Encounter: Payer: Self-pay | Admitting: *Deleted

## 2024-06-17 ENCOUNTER — Other Ambulatory Visit: Payer: Self-pay

## 2024-06-17 ENCOUNTER — Encounter (HOSPITAL_BASED_OUTPATIENT_CLINIC_OR_DEPARTMENT_OTHER): Payer: Self-pay | Admitting: General Surgery

## 2024-06-17 VITALS — BP 116/81 | HR 72 | Ht 64.0 in | Wt 175.0 lb

## 2024-06-17 DIAGNOSIS — Z803 Family history of malignant neoplasm of breast: Secondary | ICD-10-CM | POA: Diagnosis not present

## 2024-06-17 DIAGNOSIS — Z9889 Other specified postprocedural states: Secondary | ICD-10-CM

## 2024-06-17 DIAGNOSIS — D0512 Intraductal carcinoma in situ of left breast: Secondary | ICD-10-CM

## 2024-06-17 DIAGNOSIS — Z01818 Encounter for other preprocedural examination: Secondary | ICD-10-CM

## 2024-06-17 DIAGNOSIS — C50411 Malignant neoplasm of upper-outer quadrant of right female breast: Secondary | ICD-10-CM | POA: Diagnosis not present

## 2024-06-17 MED ORDER — ONDANSETRON 4 MG PO TBDP: 4.0000 mg | ORAL_TABLET | Freq: Three times a day (TID) | ORAL | 0 refills | Status: AC | PRN

## 2024-06-17 MED ORDER — ENOXAPARIN SODIUM 150 MG/ML IJ SOSY: 40.0000 mg | PREFILLED_SYRINGE | INTRAMUSCULAR | Status: AC

## 2024-06-17 MED ORDER — CYCLOBENZAPRINE HCL 5 MG PO TABS: 5.0000 mg | ORAL_TABLET | Freq: Three times a day (TID) | ORAL | 0 refills | Status: AC | PRN

## 2024-06-17 MED ORDER — GABAPENTIN 300 MG PO CAPS: 300.0000 mg | ORAL_CAPSULE | Freq: Two times a day (BID) | ORAL | 0 refills | Status: DC

## 2024-06-17 MED ORDER — IBUPROFEN 600 MG PO TABS: 600.0000 mg | ORAL_TABLET | Freq: Three times a day (TID) | ORAL | 0 refills | Status: AC | PRN

## 2024-06-17 MED ORDER — ACETAMINOPHEN 500 MG PO TABS: 500.0000 mg | ORAL_TABLET | Freq: Four times a day (QID) | ORAL | 0 refills | Status: AC | PRN

## 2024-06-17 MED ORDER — OXYCODONE HCL 5 MG PO TABS: 5.0000 mg | ORAL_TABLET | ORAL | 0 refills | Status: AC | PRN

## 2024-06-17 NOTE — Progress Notes (Signed)
 Patient ID: Tara Yates, female    DOB: 1965/09/23, 58 y.o.   MRN: [id number]  Chief Complaint  Patient presents with   Pre-op Exam    No diagnosis found.   History of Present Illness: Tara Yates is a 58 y.o.  female  with a history of breast cancer.  She presents for preoperative evaluation for upcoming procedure, bilateral mastectomies we have agreed upon staged reconstruction with the first stage being immediate bilateral tissue expander placement with ADM/Mesh, scheduled for 12/18 with Dr. Ebbie and Zach Tietje.  The patient has not had problems with anesthesia.   Has had travelers diarrhea. Returned from [employer] 9 days ago. Symptoms have resolved per patient.  Past Medical History: Allergies: Allergies[1]  Current Medications: Current Medications[2]  Past Medical Problems: Past Medical History:  Diagnosis Date   Allergy    Anxiety    Back pain    Breast cancer (HCC)    Constipation    Depression    Ductal carcinoma in situ of left breast 11/09/2020   Family history of breast cancer 11/09/2020   HSV (herpes simplex virus) anogenital infection    Hyperlipidemia    Hypothyroidism    Joint pain    Plantar fasciitis, bilateral    SOBOE (shortness of breath on exertion)     Past Surgical History: Past Surgical History:  Procedure Laterality Date   BREAST BIOPSY Right 02/28/2015   BREAST BIOPSY Left 07/11/2021   BREAST EXCISIONAL BIOPSY Right 03/29/2015   had seed placement as wel   BREAST LUMPECTOMY WITH RADIOACTIVE SEED LOCALIZATION Left 12/18/2020   Procedure: LEFT BREAST LUMPECTOMY WITH RADIOACTIVE SEED LOCALIZATION;  Surgeon: [name], MD;  Location: [facility];  Service: General;  Laterality: Left;   CARPAL TUNNEL RELEASE Right 2022   ENDOMETRIAL ABLATION     MOUTH SURGERY     RADIOACTIVE SEED GUIDED EXCISIONAL BREAST BIOPSY Right 04/03/2015   Procedure: RADIOACTIVE SEED GUIDED EXCISIONAL BREAST BIOPSY;  Surgeon:  Cough Ebbie, MD;  Location: [facility];  Service: General;  Laterality: Right;   RADIOACTIVE SEED GUIDED EXCISIONAL BREAST BIOPSY Left 02/06/2022   Procedure: LEFT BREAST SEED GUIDED EXCISIONAL BIOPSY;  Surgeon: [name], MD;  Location: [facility] OR;  Service: General;  Laterality: Left;  LMA    Social History: Social History   Socioeconomic History   Marital status: Divorced    Spouse name: Not on file   Number of children: 2   Years of education: Not on file   Highest education level: Not on file  Occupational History   Not on file  Tobacco Use   Smoking status: Former    Current packs/day: 0.00    Average packs/day: 1 pack/day for 29.0 years (29.0 ttl pk-yrs)    Types: Cigarettes    Start date: 84    Quit date: 2000    Years since quitting: 25.9   Smokeless tobacco: Never   Tobacco comments:    Quit 20 years ago  Vaping Use   Vaping status: Never Used  Substance and Sexual Activity   Alcohol use: Yes    Alcohol/week: 7.0 - 14.0 standard drinks of alcohol    Types: 7 - 14 Glasses of wine per week    Comment: 1-2 glasses of wine a day, maybe a beer   Drug use: Never   Sexual activity: Not Currently    Birth control/protection: Post-menopausal    Comment: ablation  Other Topics Concern   Not on  file  Social History Narrative   Not on file   Social Drivers of Health   Tobacco Use: Medium Risk (06/14/2024)   Patient History    Smoking Tobacco Use: Former    Smokeless Tobacco Use: Never    Passive Exposure: Not on Actuary Strain: Not on file  Food Insecurity: Not on file  Transportation Needs: Not on file  Physical Activity: Not on file  Stress: Not on file  Social Connections: Not on file  Intimate Partner Violence: Not on file  Depression (PHQ2-9): Low Risk (04/14/2024)   Depression (PHQ2-9)    PHQ-2 Score: 0  Alcohol Screen: Not on file  Housing: Unknown (04/08/2024)   Received from [facility]    Epic    Unable to Pay for Housing in the Last Year: Not on file    Number of Times Moved in the Last Year: Not on file    At any time in the past 12 months, were you homeless or living in a shelter (including now)?: No  Utilities: Not on file  Health Literacy: Not on file    Family History: Family History  Problem Relation Age of Onset   Heart disease Mother    Heart failure Mother    Heart attack Mother    Hyperlipidemia Mother    Sudden death Mother    Colon polyps Father    Heart disease Father    Hyperlipidemia Father    Hypertension Father    Thyroid  disease Father    Sleep apnea Father    Obesity Father    Breast cancer Paternal Grandmother        dx 55s, d. 20   Breast cancer Other        PGM's sisters, x2, dx unknown age   Colon cancer Neg Hx    Esophageal cancer Neg Hx    Rectal cancer Neg Hx    Stomach cancer Neg Hx     Review of Systems: ROS  Physical Exam: Vital Signs BP 116/81 (BP Location: Left Arm, Patient Position: Sitting, Cuff Size: Normal)   Pulse 72   Ht 5' 4 (1.626 m)   Wt 175 lb (79.4 kg)   LMP 07/07/2018   SpO2 98%   BMI 30.04 kg/m   Physical Exam  MA as chaperone Constitutional:      General: Not in acute distress.    Appearance: Normal appearance. Not ill-appearing.  HENT:     Head: Normocephalic and atraumatic.  Eyes:     Pupils: Pupils are equal, round. Cardiovascular:     Rate and Rhythm: Normal rate.    Pulses: Normal pulses.  Pulmonary:     Effort: No respiratory distress or increased work of breathing.  Speaks in full sentences. Abdominal:     General: Abdomen is flat. No distension.   Musculoskeletal: Normal range of motion. No lower extremity swelling or edema. No varicosities.  Skin:    General: Skin is warm and dry.     Findings: No erythema or rash.  Neurological:     Mental Status: Alert and oriented to person, place, and time.  Psychiatric:        Mood and Affect: Mood normal.        Behavior: Behavior  normal.    Assessment/Plan: The patient is scheduled for bilateral mastectomies we have agreed upon staged reconstruction with the first stage being immediate bilateral tissue expander placement with ADM/Mesh with Dr. Ebbie and Dr. Dajana Gehrig.  Risks, benefits,  and alternatives of procedure discussed, questions answered and consent obtained.    Smoking Status: Fromer; Counseling Given? N/A Last Mammogram: 03/26/2024  Results: BI-RADS CATEGORY 4: Suspicious.   Caprini Score: 7; Risk Factors include: Age, BMI > 25, history of cancer, hormonal therapy and length of planned surgery. Recommendation for mechanical & pharmacological prophylaxis. Encourage early ambulation.   Pictures obtained: Yes  Post-op Rx sent to pharmacy: Yes, Tylenol , Motrin, Gabapentin, Oxycodone , Flexeril, Zofran , Lovenox SQ  Patient was provided with the General Surgical Risk consent document and Pain Medication Agreement prior to their appointment.  They had adequate time to read through the risk consent documents and Pain Medication Agreement. We also discussed them in person together during this preop appointment. All of their questions were answered to their satisfaction.  Recommended calling if they have any further questions.  Risk consent form and Pain Medication Agreement to be scanned into patient's chart. Picture consent obtained as well.    Electronically signed by: Elijahjames Fuelling M Twanisha Foulk, MD 06/17/2024 10:42 AM      [1]  Allergies Allergen Reactions   Short Ragweed Pollen Ext Other (See Comments)  [2]  Current Outpatient Medications:    anastrozole  (ARIMIDEX ) 1 MG tablet, TAKE 1 TABLET BY MOUTH EVERY DAY, Disp: 90 tablet, Rfl: 1   BIOTIN MAXIMUM PO, Take by mouth., Disp: , Rfl:    Calcium Carb-Cholecalciferol (CALCIUM 600 + D PO), Take by mouth., Disp: , Rfl:    cholecalciferol (VITAMIN D3) 25 MCG (1000 UNIT) tablet, Take 1,000 Units by mouth daily., Disp: , Rfl:    LAVENDER OIL PO, Take by mouth.,  Disp: , Rfl:    levothyroxine  (SYNTHROID ) 25 MCG tablet, TAKE 1 TABLET BY MOUTH EVERY DAY BEFORE BREAKFAST, Disp: 30 tablet, Rfl: 3   magnesium gluconate (MAGONATE) 500 (27 Mg) MG TABS tablet, Take 500 mg by mouth in the morning and at bedtime., Disp: , Rfl:    Menaquinone-7 (VITAMIN K2 PO), Take by mouth., Disp: , Rfl:    Multiple Vitamin (MULTI-VITAMIN) tablet, Take by mouth., Disp: , Rfl:    Omega-3 Fatty Acids (OMEGA 3 FISH OIL PO), Take by mouth., Disp: , Rfl:    Probiotic Product (ALIGN PO), Take by mouth., Disp: , Rfl:

## 2024-06-17 NOTE — H&P (View-Only) (Signed)
 Patient ID: Tara Yates, female    DOB: 1965/09/23, 58 y.o.   MRN: [id number]  Chief Complaint  Patient presents with   Pre-op Exam    No diagnosis found.   History of Present Illness: Tara Yates is a 58 y.o.  female  with a history of breast cancer.  She presents for preoperative evaluation for upcoming procedure, bilateral mastectomies we have agreed upon staged reconstruction with the first stage being immediate bilateral tissue expander placement with ADM/Mesh, scheduled for 12/18 with Dr. Ebbie and Zach Tietje.  The patient has not had problems with anesthesia.   Has had travelers diarrhea. Returned from [place] [employer] 9 days ago. Symptoms have resolved per patient.  Past Medical History: Allergies: Allergies[1]  Current Medications: Current Medications[2]  Past Medical Problems: Past Medical History:  Diagnosis Date   Allergy    Anxiety    Back pain    Breast cancer (HCC)    Constipation    Depression    Ductal carcinoma in situ of left breast 11/09/2020   Family history of breast cancer 11/09/2020   HSV (herpes simplex virus) anogenital infection    Hyperlipidemia    Hypothyroidism    Joint pain    Plantar fasciitis, bilateral    SOBOE (shortness of breath on exertion)     Past Surgical History: Past Surgical History:  Procedure Laterality Date   BREAST BIOPSY Right 02/28/2015   BREAST BIOPSY Left 07/11/2021   BREAST EXCISIONAL BIOPSY Right 03/29/2015   had seed placement as wel   BREAST LUMPECTOMY WITH RADIOACTIVE SEED LOCALIZATION Left 12/18/2020   Procedure: LEFT BREAST LUMPECTOMY WITH RADIOACTIVE SEED LOCALIZATION;  Surgeon: [name], MD;  Location: [facility];  Service: General;  Laterality: Left;   CARPAL TUNNEL RELEASE Right 2022   ENDOMETRIAL ABLATION     MOUTH SURGERY     RADIOACTIVE SEED GUIDED EXCISIONAL BREAST BIOPSY Right 04/03/2015   Procedure: RADIOACTIVE SEED GUIDED EXCISIONAL BREAST BIOPSY;  Surgeon:  Cough Ebbie, MD;  Location: [facility];  Service: General;  Laterality: Right;   RADIOACTIVE SEED GUIDED EXCISIONAL BREAST BIOPSY Left 02/06/2022   Procedure: LEFT BREAST SEED GUIDED EXCISIONAL BIOPSY;  Surgeon: [name], MD;  Location: [facility] OR;  Service: General;  Laterality: Left;  LMA    Social History: Social History   Socioeconomic History   Marital status: Divorced    Spouse name: Not on file   Number of children: 2   Years of education: Not on file   Highest education level: Not on file  Occupational History   Not on file  Tobacco Use   Smoking status: Former    Current packs/day: 0.00    Average packs/day: 1 pack/day for 29.0 years (29.0 ttl pk-yrs)    Types: Cigarettes    Start date: 84    Quit date: 2000    Years since quitting: 25.9   Smokeless tobacco: Never   Tobacco comments:    Quit 20 years ago  Vaping Use   Vaping status: Never Used  Substance and Sexual Activity   Alcohol use: Yes    Alcohol/week: 7.0 - 14.0 standard drinks of alcohol    Types: 7 - 14 Glasses of wine per week    Comment: 1-2 glasses of wine a day, maybe a beer   Drug use: Never   Sexual activity: Not Currently    Birth control/protection: Post-menopausal    Comment: ablation  Other Topics Concern   Not on  file  Social History Narrative   Not on file   Social Drivers of Health   Tobacco Use: Medium Risk (06/14/2024)   Patient History    Smoking Tobacco Use: Former    Smokeless Tobacco Use: Never    Passive Exposure: Not on Actuary Strain: Not on file  Food Insecurity: Not on file  Transportation Needs: Not on file  Physical Activity: Not on file  Stress: Not on file  Social Connections: Not on file  Intimate Partner Violence: Not on file  Depression (PHQ2-9): Low Risk (04/14/2024)   Depression (PHQ2-9)    PHQ-2 Score: 0  Alcohol Screen: Not on file  Housing: Unknown (04/08/2024)   Received from [facility]    Epic    Unable to Pay for Housing in the Last Year: Not on file    Number of Times Moved in the Last Year: Not on file    At any time in the past 12 months, were you homeless or living in a shelter (including now)?: No  Utilities: Not on file  Health Literacy: Not on file    Family History: Family History  Problem Relation Age of Onset   Heart disease Mother    Heart failure Mother    Heart attack Mother    Hyperlipidemia Mother    Sudden death Mother    Colon polyps Father    Heart disease Father    Hyperlipidemia Father    Hypertension Father    Thyroid  disease Father    Sleep apnea Father    Obesity Father    Breast cancer Paternal Grandmother        dx 55s, d. 20   Breast cancer Other        PGM's sisters, x2, dx unknown age   Colon cancer Neg Hx    Esophageal cancer Neg Hx    Rectal cancer Neg Hx    Stomach cancer Neg Hx     Review of Systems: ROS  Physical Exam: Vital Signs BP 116/81 (BP Location: Left Arm, Patient Position: Sitting, Cuff Size: Normal)   Pulse 72   Ht 5' 4 (1.626 m)   Wt 175 lb (79.4 kg)   LMP 07/07/2018   SpO2 98%   BMI 30.04 kg/m   Physical Exam  MA as chaperone Constitutional:      General: Not in acute distress.    Appearance: Normal appearance. Not ill-appearing.  HENT:     Head: Normocephalic and atraumatic.  Eyes:     Pupils: Pupils are equal, round. Cardiovascular:     Rate and Rhythm: Normal rate.    Pulses: Normal pulses.  Pulmonary:     Effort: No respiratory distress or increased work of breathing.  Speaks in full sentences. Abdominal:     General: Abdomen is flat. No distension.   Musculoskeletal: Normal range of motion. No lower extremity swelling or edema. No varicosities.  Skin:    General: Skin is warm and dry.     Findings: No erythema or rash.  Neurological:     Mental Status: Alert and oriented to person, place, and time.  Psychiatric:        Mood and Affect: Mood normal.        Behavior: Behavior  normal.    Assessment/Plan: The patient is scheduled for bilateral mastectomies we have agreed upon staged reconstruction with the first stage being immediate bilateral tissue expander placement with ADM/Mesh with Dr. Ebbie and Dr. Dajana Gehrig.  Risks, benefits,  and alternatives of procedure discussed, questions answered and consent obtained.    Smoking Status: Fromer; Counseling Given? N/A Last Mammogram: 03/26/2024  Results: BI-RADS CATEGORY 4: Suspicious.   Caprini Score: 7; Risk Factors include: Age, BMI > 25, history of cancer, hormonal therapy and length of planned surgery. Recommendation for mechanical & pharmacological prophylaxis. Encourage early ambulation.   Pictures obtained: Yes  Post-op Rx sent to pharmacy: Yes, Tylenol , Motrin, Gabapentin, Oxycodone , Flexeril, Zofran , Lovenox SQ  Patient was provided with the General Surgical Risk consent document and Pain Medication Agreement prior to their appointment.  They had adequate time to read through the risk consent documents and Pain Medication Agreement. We also discussed them in person together during this preop appointment. All of their questions were answered to their satisfaction.  Recommended calling if they have any further questions.  Risk consent form and Pain Medication Agreement to be scanned into patient's chart. Picture consent obtained as well.    Electronically signed by: Elijahjames Fuelling M Twanisha Foulk, MD 06/17/2024 10:42 AM      [1]  Allergies Allergen Reactions   Short Ragweed Pollen Ext Other (See Comments)  [2]  Current Outpatient Medications:    anastrozole  (ARIMIDEX ) 1 MG tablet, TAKE 1 TABLET BY MOUTH EVERY DAY, Disp: 90 tablet, Rfl: 1   BIOTIN MAXIMUM PO, Take by mouth., Disp: , Rfl:    Calcium Carb-Cholecalciferol (CALCIUM 600 + D PO), Take by mouth., Disp: , Rfl:    cholecalciferol (VITAMIN D3) 25 MCG (1000 UNIT) tablet, Take 1,000 Units by mouth daily., Disp: , Rfl:    LAVENDER OIL PO, Take by mouth.,  Disp: , Rfl:    levothyroxine  (SYNTHROID ) 25 MCG tablet, TAKE 1 TABLET BY MOUTH EVERY DAY BEFORE BREAKFAST, Disp: 30 tablet, Rfl: 3   magnesium gluconate (MAGONATE) 500 (27 Mg) MG TABS tablet, Take 500 mg by mouth in the morning and at bedtime., Disp: , Rfl:    Menaquinone-7 (VITAMIN K2 PO), Take by mouth., Disp: , Rfl:    Multiple Vitamin (MULTI-VITAMIN) tablet, Take by mouth., Disp: , Rfl:    Omega-3 Fatty Acids (OMEGA 3 FISH OIL PO), Take by mouth., Disp: , Rfl:    Probiotic Product (ALIGN PO), Take by mouth., Disp: , Rfl:

## 2024-06-17 NOTE — Progress Notes (Addendum)
 Pt seen by PCP on 06/14/24 and treated for Travelers Diarrhea. Reviewed with Dr Dorethea, surgery will need to be postponed. Dr Ebbie and Dr Luke offices notified. ----------------------------------  Pt's symptoms have resolved and she has completed antibiotic course. Pt may proceed as planned with upcoming surgery. Dr Ebbie, Dr Montorfano and Dr Stoltzfus in agreement.

## 2024-06-21 MED ORDER — CHLORHEXIDINE GLUCONATE CLOTH 2 % EX PADS: 6.0000 | MEDICATED_PAD | Freq: Once | CUTANEOUS | Status: DC

## 2024-06-21 MED ORDER — ENSURE PRE-SURGERY PO LIQD: 296.0000 mL | Freq: Once | ORAL | Status: DC

## 2024-06-21 NOTE — Progress Notes (Signed)

## 2024-06-22 NOTE — Anesthesia Preprocedure Evaluation (Signed)
 Anesthesia Evaluation  Patient identified by MRN, date of birth, ID band Patient awake    Reviewed: Allergy & Precautions, NPO status , Patient's Chart, lab work & pertinent test results  History of Anesthesia Complications Negative for: history of anesthetic complications  Airway Mallampati: II  TM Distance: >3 FB Neck ROM: Full    Dental  (+) Dental Advisory Given   Pulmonary neg shortness of breath, neg sleep apnea, neg COPD, neg recent URI, former smoker   Pulmonary exam normal breath sounds clear to auscultation       Cardiovascular (-) hypertension(-) angina (-) Past MI, (-) Cardiac Stents and (-) CABG (-) dysrhythmias  Rhythm:Regular Rate:Normal  HLD   Neuro/Psych  PSYCHIATRIC DISORDERS Anxiety Depression    negative neurological ROS     GI/Hepatic negative GI ROS, Neg liver ROS,,,  Endo/Other  Hypothyroidism    Renal/GU negative Renal ROS     Musculoskeletal   Abdominal   Peds  Hematology negative hematology ROS (+)   Anesthesia Other Findings   Reproductive/Obstetrics Right breast cancer, h/o left breast cancer                              Anesthesia Physical Anesthesia Plan  ASA: 2  Anesthesia Plan: General   Post-op Pain Management: Tylenol  PO (pre-op)* and Regional block*   Induction: Intravenous  PONV Risk Score and Plan: 3 and Ondansetron , Dexamethasone , Treatment may vary due to age or medical condition, Propofol  infusion and Midazolam   Airway Management Planned: Oral ETT  Additional Equipment:   Intra-op Plan:   Post-operative Plan: Extubation in OR  Informed Consent: I have reviewed the patients History and Physical, chart, labs and discussed the procedure including the risks, benefits and alternatives for the proposed anesthesia with the patient or authorized representative who has indicated his/her understanding and acceptance.     Dental advisory  given  Plan Discussed with: CRNA and Anesthesiologist  Anesthesia Plan Comments: (Discussed potential risks of nerve blocks including, but not limited to, infection, bleeding, nerve damage, seizures, pneumothorax, respiratory depression, and potential failure of the block. Alternatives to nerve blocks discussed. All questions answered.  Risks of general anesthesia discussed including, but not limited to, sore throat, hoarse voice, chipped/damaged teeth, injury to vocal cords, nausea and vomiting, allergic reactions, lung infection, heart attack, stroke, and death. All questions answered. )         Anesthesia Quick Evaluation

## 2024-06-22 NOTE — H&P (Signed)
 58 y.o. female I know from 2022 where she underwent a lumpectomy for DCIS that was estrogen receptor and progesterone receptor positive. This was intermediate. She did not proceed with radiation or antiestrogen at that time as she had a family emergency with her father and she took care of him during that time. Due to her risk she had undergone an MRI recently which shows C density breast. The right side is normal. The lymph nodes are all normal. There are in the left breast in the upper left breast there is some suspicious linear non-mass enhancement measuring 1.6 x 0.6 x 1.1 cm. This has undergone a biopsy which shows this to be lobular neoplasia involving a small sclerotic lesion. she had repeat mri that shows this area is larger now at 2.1x1.3 cm. It underwent a rebiopsy and is still alh. She is taking 5 mg tamoxifen  now since January. I did a seed guided excision of this area and it is grade II er/pr negative dcis with clear margins. She then got radiotherapy and remains on 10 mg qod of tam. She then had f/u mri as planned. This showed c density breasts. There is linear nme up to 3.5 cm in uiq of left breast. This is similary to previous biopsy etc she has had. This underwent biopsy again and is benign concordant with recommendation of f/u six month imaging. There are two areas of adjacent nme in uoq of right breast. These measure 13 and 18 mm This was biopsied and is grade II IDC. 40% er pos, pr neg, her 2 negative and Ki is 5%. She has seen rad onc, plastics and is here to discuss decision. She is on antiestrogen  Review of Systems: A complete review of systems was obtained from the patient. I have reviewed this information and discussed as appropriate with the patient. See HPI as well for other ROS.  Review of Systems  All other systems reviewed and are negative.  Medical History: Past Medical History:  Diagnosis Date  History of cancer  Thyroid  disease   Patient Active Problem List   Diagnosis  Carcinoma of upper-outer quadrant of right female breast (CMS/HHS-HCC)   Past Surgical History:  Procedure Laterality Date  MASTECTOMY PARTIAL / LUMPECTOMY 12/18/2020  MASTECTOMY PARTIAL / LUMPECTOMY Left 02/06/2022   No Known Allergies  Current Outpatient Medications on File Prior to Visit  Medication Sig Dispense Refill  acyclovir (ZOVIRAX) 5 % ointment acyclovir 5 % topical ointment APPLY TO THE AFFECTED AREA(S) BY TOPICAL ROUTE EVERY 3 HOURS 6 TIMES PER DAY x 7 days  b complex vitamins capsule Take 1 capsule by mouth once daily  benzonatate  (TESSALON ) 100 MG capsule benzonatate  100 mg capsule  biotin 10 mg Tab Take by mouth  cholecalciferol (VITAMIN D3) 1000 unit tablet Take by mouth  fluticasone  propionate (FLONASE ) 50 mcg/actuation nasal spray fluticasone  propionate 50 mcg/actuation nasal spray,suspension SHAKE LIQUID AND USE 1 SPRAY IN EACH NOSTRIL DAILY  L. acidophilus/Bifid. animalis 32 billion cell Cap Take by mouth once daily  levothyroxine  (SYNTHROID ) 25 MCG tablet levothyroxine  25 mcg tablet  lidocaine  (XYLOCAINE ) 2 % jelly lidocaine  HCl 2 % mucosal jelly  liothyronine (CYTOMEL) 5 MCG tablet TAKE 1 TAB EVERY MORNING X 7 DAYS, IF WELL TOLERATED THEN INCREASE TO 2 TABS EVERY MORNING  loratadine 10 mg Chew Claritin 10 mg chewable tablet 1 a day  meloxicam (MOBIC) 15 MG tablet Take 1 tablet (15 mg total) by mouth 3 (three) times daily  polyethylene glycol (MIRALAX) powder Take by  mouth  predniSONE 5 mg DsPk as directed  sertraline  (ZOLOFT ) 50 MG tablet sertraline  50 mg tablet  tamoxifen  (NOLVADEX ) 10 MG tablet Take 10 mg by mouth every other day  terconazole (TERAZOL 7) 0.4 % vaginal cream terconazole 0.4 % vaginal cream INSERT 1 APPLICATORFUL VAGINALLY EVERY DAY FOR 7 DAYS  traMADoL  (ULTRAM ) 50 mg tablet TAKE 2 TABLETS BY MOUTH EVERY 6 HOURS AS NEEDED  valACYclovir  (VALTREX ) 1000 MG tablet valacyclovir  1 gram tablet take 1 tablet po bid x 5 days prn  vitamin  E 1000 UNIT capsule Take by mouth    Family History  Problem Relation Age of Onset  Hyperlipidemia (Elevated cholesterol) Mother  Hyperlipidemia (Elevated cholesterol) Father  High blood pressure (Hypertension) Sister  Hyperlipidemia (Elevated cholesterol) Sister    Social History   Tobacco Use  Smoking Status Former  Types: Cigarettes  Smokeless Tobacco Never    Social History   Socioeconomic History  Marital status: Divorced  Tobacco Use  Smoking status: Former  Types: Cigarettes  Smokeless tobacco: Never  Substance and Sexual Activity  Alcohol use: Yes  Drug use: Never   Objective:   Physical Exam Constitutional:  Appearance: Normal appearance.  Chest:  Breasts: Right: No inverted nipple, mass or nipple discharge.  Left: No inverted nipple, mass or nipple discharge.  Lymphadenopathy:  Upper Body:  Right upper body: No supraclavicular or axillary adenopathy.  Left upper body: No supraclavicular or axillary adenopathy.  Neurological:  Mental Status: She is alert.    Assessment and Plan:   Bilateral SSM with right ax sn biopsy Combination with expander placement with plastic surgery  We again went over the differences between lumpectomy as well as mastectomy. She is elected to proceed with bilateral mastectomy with right axillary sentinel lymph node biopsy. She is seeing plastic surgery and will have expanders placed at the same time. We again discussed the risks as well as the recovery as well as the outcomes for her cancer based on this. We are going to schedule her after November 20 per her request. I do not think she is a candidate for nipple sparing mastectomies we will plan skin-sparing mastectomy. She does have an Oncotype pending that we will follow-up prior to surgery as well.

## 2024-06-23 ENCOUNTER — Encounter (HOSPITAL_BASED_OUTPATIENT_CLINIC_OR_DEPARTMENT_OTHER): Admission: RE | Disposition: A | Payer: Self-pay | Attending: General Surgery

## 2024-06-23 ENCOUNTER — Ambulatory Visit (HOSPITAL_BASED_OUTPATIENT_CLINIC_OR_DEPARTMENT_OTHER): Admitting: Anesthesiology

## 2024-06-23 ENCOUNTER — Encounter (HOSPITAL_BASED_OUTPATIENT_CLINIC_OR_DEPARTMENT_OTHER): Payer: Self-pay | Admitting: General Surgery

## 2024-06-23 ENCOUNTER — Other Ambulatory Visit: Payer: Self-pay

## 2024-06-23 ENCOUNTER — Observation Stay (HOSPITAL_BASED_OUTPATIENT_CLINIC_OR_DEPARTMENT_OTHER): Admission: RE | Admit: 2024-06-23 | Discharge: 2024-06-24 | Disposition: A | Discharge status: Home / Self Care

## 2024-06-23 DIAGNOSIS — N6022 Fibroadenosis of left breast: Secondary | ICD-10-CM | POA: Diagnosis not present

## 2024-06-23 DIAGNOSIS — C50912 Malignant neoplasm of unspecified site of left female breast: Secondary | ICD-10-CM | POA: Diagnosis not present

## 2024-06-23 DIAGNOSIS — N6021 Fibroadenosis of right breast: Secondary | ICD-10-CM | POA: Diagnosis not present

## 2024-06-23 DIAGNOSIS — Z421 Encounter for breast reconstruction following mastectomy: Secondary | ICD-10-CM | POA: Diagnosis not present

## 2024-06-23 DIAGNOSIS — E039 Hypothyroidism, unspecified: Secondary | ICD-10-CM | POA: Insufficient documentation

## 2024-06-23 DIAGNOSIS — C50911 Malignant neoplasm of unspecified site of right female breast: Secondary | ICD-10-CM

## 2024-06-23 DIAGNOSIS — C50919 Malignant neoplasm of unspecified site of unspecified female breast: Principal | ICD-10-CM | POA: Diagnosis present

## 2024-06-23 DIAGNOSIS — N6081 Other benign mammary dysplasias of right breast: Principal | ICD-10-CM | POA: Insufficient documentation

## 2024-06-23 DIAGNOSIS — Z87891 Personal history of nicotine dependence: Secondary | ICD-10-CM | POA: Diagnosis not present

## 2024-06-23 HISTORY — PX: TOTAL MASTECTOMY: SHX6129

## 2024-06-23 HISTORY — PX: AXILLARY SENTINEL NODE BIOPSY: SHX5738

## 2024-06-23 HISTORY — PX: ADJACENT TISSUE TRANSFER/TISSUE REARRANGEMENT: SHX6829

## 2024-06-23 HISTORY — PX: TISSUE EXPANDER PLACEMENT: SHX2530

## 2024-06-23 SURGERY — MASTECTOMY, SIMPLE
Anesthesia: General | Site: Chest | Laterality: Right

## 2024-06-23 MED ORDER — MIDAZOLAM HCL (PF) 2 MG/2ML IJ SOLN
2.0000 mg | Freq: Once | INTRAMUSCULAR | Status: AC
  Administered 2024-06-23: 07:00:00 2 mg via INTRAVENOUS

## 2024-06-23 MED ORDER — VASHE WOUND IRRIGATION OPTIME
TOPICAL | Status: DC | PRN
  Administered 2024-06-23: 10:00:00 34 [oz_av] via TOPICAL

## 2024-06-23 MED ORDER — PHENYLEPHRINE HCL (PRESSORS) 10 MG/ML IV SOLN
INTRAVENOUS | Status: DC | PRN
  Administered 2024-06-23 (×5): 160 ug via INTRAVENOUS
  Administered 2024-06-23: 11:00:00 80 ug via INTRAVENOUS

## 2024-06-23 MED ORDER — HEPARIN SODIUM (PORCINE) 5000 UNIT/ML IJ SOLN: 5000.0000 [IU] | Freq: Three times a day (TID) | INTRAMUSCULAR | Status: DC

## 2024-06-23 MED ORDER — OXYCODONE HCL 5 MG PO TABS
5.0000 mg | ORAL_TABLET | ORAL | Status: DC | PRN
  Administered 2024-06-23: 5 mg via ORAL
  Filled 2024-06-23: qty 1

## 2024-06-23 MED ORDER — STERILE WATER FOR INJECTION IV SOLN
INTRAMUSCULAR | Status: DC | PRN
  Administered 2024-06-23 (×2): 2 mL

## 2024-06-23 MED ORDER — AMISULPRIDE (ANTIEMETIC) 5 MG/2ML IV SOLN: 10.0000 mg | Freq: Once | INTRAVENOUS | Status: DC | PRN

## 2024-06-23 MED ORDER — PANTOPRAZOLE SODIUM 40 MG IV SOLR
40.0000 mg | Freq: Every day | INTRAVENOUS | Status: DC
  Administered 2024-06-23: 40 mg via INTRAVENOUS
  Filled 2024-06-23: qty 10

## 2024-06-23 MED ORDER — PHENYLEPHRINE HCL-NACL 20-0.9 MG/250ML-% IV SOLN
INTRAVENOUS | Status: DC | PRN
  Administered 2024-06-23: 09:00:00 25 ug/min via INTRAVENOUS

## 2024-06-23 MED ORDER — TRANEXAMIC ACID 1000 MG/10ML IV SOLN
Status: DC | PRN
  Administered 2024-06-23 (×2): 3000 mg via TOPICAL

## 2024-06-23 MED ORDER — HYDROMORPHONE HCL 1 MG/ML IJ SOLN
INTRAMUSCULAR | Status: DC | PRN
  Administered 2024-06-23 (×2): .5 mg via INTRAVENOUS

## 2024-06-23 MED ORDER — FENTANYL CITRATE (PF) 100 MCG/2ML IJ SOLN
INTRAMUSCULAR | Status: AC
  Filled 2024-06-23: qty 2

## 2024-06-23 MED ORDER — HEPARIN SODIUM (PORCINE) 5000 UNIT/ML IJ SOLN
INTRAMUSCULAR | Status: AC
  Filled 2024-06-23: qty 1

## 2024-06-23 MED ORDER — SCOPOLAMINE 1 MG/3DAYS TD PT72
MEDICATED_PATCH | TRANSDERMAL | Status: AC
  Filled 2024-06-23: qty 1

## 2024-06-23 MED ORDER — FENTANYL CITRATE (PF) 100 MCG/2ML IJ SOLN
INTRAMUSCULAR | Status: DC | PRN
  Administered 2024-06-23: 08:00:00 100 ug via INTRAVENOUS
  Administered 2024-06-23: 12:00:00 50 ug via INTRAVENOUS
  Administered 2024-06-23: 11:00:00 25 ug via INTRAVENOUS

## 2024-06-23 MED ORDER — CEFAZOLIN SODIUM-DEXTROSE 2-4 GM/100ML-% IV SOLN
2.0000 g | Freq: Three times a day (TID) | INTRAVENOUS | Status: DC
  Administered 2024-06-23 – 2024-06-24 (×2): 2 g via INTRAVENOUS
  Filled 2024-06-23 (×2): qty 100

## 2024-06-23 MED ORDER — PROCHLORPERAZINE EDISYLATE 10 MG/2ML IJ SOLN: 5.0000 mg | Freq: Four times a day (QID) | INTRAMUSCULAR | Status: DC | PRN

## 2024-06-23 MED ORDER — ALBUMIN HUMAN 5 % IV SOLN
INTRAVENOUS | Status: AC
  Filled 2024-06-23: qty 500

## 2024-06-23 MED ORDER — LACTATED RINGERS IV SOLN: INTRAVENOUS | Status: AC

## 2024-06-23 MED ORDER — GABAPENTIN 300 MG PO CAPS
ORAL_CAPSULE | ORAL | Status: AC
  Filled 2024-06-23: qty 1

## 2024-06-23 MED ORDER — MAGTRACE LYMPHATIC TRACER
INTRAMUSCULAR | Status: DC | PRN
  Administered 2024-06-23: 08:00:00 2 mL via INTRAMUSCULAR

## 2024-06-23 MED ORDER — IBUPROFEN 600 MG PO TABS
600.0000 mg | ORAL_TABLET | Freq: Four times a day (QID) | ORAL | Status: DC | PRN
  Administered 2024-06-23 – 2024-06-24 (×2): 600 mg via ORAL
  Filled 2024-06-23 (×2): qty 1

## 2024-06-23 MED ORDER — SCOPOLAMINE 1 MG/3DAYS TD PT72
1.0000 | MEDICATED_PATCH | TRANSDERMAL | Status: DC
  Administered 2024-06-23: 07:00:00 1 mg via TRANSDERMAL

## 2024-06-23 MED ORDER — CEFAZOLIN SODIUM-DEXTROSE 2-4 GM/100ML-% IV SOLN
INTRAVENOUS | Status: AC
  Filled 2024-06-23: qty 100

## 2024-06-23 MED ORDER — PROPOFOL 500 MG/50ML IV EMUL
INTRAVENOUS | Status: DC | PRN
  Administered 2024-06-23: 08:00:00 50 ug/kg/min via INTRAVENOUS

## 2024-06-23 MED ORDER — GABAPENTIN 300 MG PO CAPS
300.0000 mg | ORAL_CAPSULE | Freq: Once | ORAL | Status: AC
  Administered 2024-06-23: 07:00:00 300 mg via ORAL

## 2024-06-23 MED ORDER — STERILE WATER FOR INJECTION IV SOLN
INTRAMUSCULAR | Status: DC | PRN
  Administered 2024-06-23: 12:00:00 2 mL

## 2024-06-23 MED ORDER — METHOCARBAMOL 500 MG PO TABS
500.0000 mg | ORAL_TABLET | Freq: Three times a day (TID) | ORAL | Status: DC | PRN
  Administered 2024-06-23: 500 mg via ORAL
  Filled 2024-06-23: qty 1

## 2024-06-23 MED ORDER — METHOCARBAMOL 1000 MG/10ML IJ SOLN: 500.0000 mg | Freq: Three times a day (TID) | INTRAMUSCULAR | Status: DC | PRN

## 2024-06-23 MED ORDER — PROCHLORPERAZINE MALEATE 10 MG PO TABS
10.0000 mg | ORAL_TABLET | Freq: Four times a day (QID) | ORAL | Status: DC | PRN
  Filled 2024-06-23: qty 1

## 2024-06-23 MED ORDER — BACITRACIN ZINC 500 UNIT/GM EX OINT
TOPICAL_OINTMENT | CUTANEOUS | Status: DC | PRN
  Administered 2024-06-23: 12:00:00 1 via TOPICAL

## 2024-06-23 MED ORDER — BUPIVACAINE HCL (PF) 0.25 % IJ SOLN
INTRAMUSCULAR | Status: DC | PRN
  Administered 2024-06-23 (×2): 30 mL via PERINEURAL

## 2024-06-23 MED ORDER — HYDROMORPHONE HCL 1 MG/ML IJ SOLN
INTRAMUSCULAR | Status: AC
  Filled 2024-06-23: qty 0.5

## 2024-06-23 MED ORDER — LIDOCAINE HCL (CARDIAC) PF 100 MG/5ML IV SOSY
PREFILLED_SYRINGE | INTRAVENOUS | Status: DC | PRN
  Administered 2024-06-23: 08:00:00 80 mg via INTRAVENOUS

## 2024-06-23 MED ORDER — SODIUM CHLORIDE (PF) 0.9 % IJ SOLN
INTRAMUSCULAR | Status: AC
  Filled 2024-06-23: qty 20

## 2024-06-23 MED ORDER — SODIUM CHLORIDE 0.9 % IV SOLN
INTRAVENOUS | Status: DC | PRN
  Administered 2024-06-23: 10:00:00 500 mL

## 2024-06-23 MED ORDER — DEXAMETHASONE SODIUM PHOSPHATE 4 MG/ML IJ SOLN
INTRAMUSCULAR | Status: DC | PRN
  Administered 2024-06-23: 08:00:00 5 mg via INTRAVENOUS

## 2024-06-23 MED ORDER — ACETAMINOPHEN 500 MG PO TABS: 1000.0000 mg | ORAL_TABLET | Freq: Once | ORAL | Status: AC

## 2024-06-23 MED ORDER — BACITRACIN ZINC 500 UNIT/GM EX OINT
TOPICAL_OINTMENT | CUTANEOUS | Status: AC
  Filled 2024-06-23: qty 28.35

## 2024-06-23 MED ORDER — OXYCODONE HCL 5 MG PO TABS: 5.0000 mg | ORAL_TABLET | Freq: Once | ORAL | Status: DC | PRN

## 2024-06-23 MED ORDER — DOCUSATE SODIUM 100 MG PO CAPS
100.0000 mg | ORAL_CAPSULE | Freq: Two times a day (BID) | ORAL | Status: DC
  Filled 2024-06-23: qty 1

## 2024-06-23 MED ORDER — MIDAZOLAM HCL 2 MG/2ML IJ SOLN
INTRAMUSCULAR | Status: AC
  Filled 2024-06-23: qty 2

## 2024-06-23 MED ORDER — OXYCODONE HCL 5 MG/5ML PO SOLN: 5.0000 mg | Freq: Once | ORAL | Status: DC | PRN

## 2024-06-23 MED ORDER — ONDANSETRON 4 MG PO TBDP
4.0000 mg | ORAL_TABLET | Freq: Four times a day (QID) | ORAL | Status: DC | PRN
  Administered 2024-06-24: 4 mg via ORAL
  Filled 2024-06-23: qty 1

## 2024-06-23 MED ORDER — ONDANSETRON HCL 4 MG/2ML IJ SOLN: 4.0000 mg | Freq: Four times a day (QID) | INTRAMUSCULAR | Status: DC | PRN

## 2024-06-23 MED ORDER — SCOPOLAMINE 1 MG/3DAYS TD PT72: 1.0000 | MEDICATED_PATCH | TRANSDERMAL | Status: DC

## 2024-06-23 MED ORDER — NITROGLYCERIN 2 % TD OINT
TOPICAL_OINTMENT | TRANSDERMAL | Status: AC
  Filled 2024-06-23: qty 30

## 2024-06-23 MED ORDER — HYDROMORPHONE HCL 1 MG/ML IJ SOLN: 0.5000 mg | INTRAMUSCULAR | Status: DC | PRN

## 2024-06-23 MED ORDER — HEPARIN SODIUM (PORCINE) 5000 UNIT/ML IJ SOLN
5000.0000 [IU] | Freq: Once | INTRAMUSCULAR | Status: AC
  Administered 2024-06-23: 07:00:00 5000 [IU] via SUBCUTANEOUS

## 2024-06-23 MED ORDER — SUGAMMADEX SODIUM 200 MG/2ML IV SOLN
INTRAVENOUS | Status: DC | PRN
  Administered 2024-06-23: 12:00:00 200 mg via INTRAVENOUS

## 2024-06-23 MED ORDER — HYDROMORPHONE HCL 1 MG/ML IJ SOLN: 0.2500 mg | INTRAMUSCULAR | Status: DC | PRN

## 2024-06-23 MED ORDER — LIDOCAINE 2% (20 MG/ML) 5 ML SYRINGE
INTRAMUSCULAR | Status: AC
  Filled 2024-06-23: qty 5

## 2024-06-23 MED ORDER — LACTATED RINGERS IV BOLUS
500.0000 mL | Freq: Once | INTRAVENOUS | Status: AC
  Administered 2024-06-23: 14:00:00 500 mL via INTRAVENOUS

## 2024-06-23 MED ORDER — ALBUMIN HUMAN 5 % IV SOLN
12.5000 g | Freq: Once | INTRAVENOUS | Status: AC
  Administered 2024-06-23: 15:00:00 12.5 g via INTRAVENOUS

## 2024-06-23 MED ORDER — ACETAMINOPHEN 500 MG PO TABS
1000.0000 mg | ORAL_TABLET | ORAL | Status: AC
  Administered 2024-06-23: 07:00:00 1000 mg via ORAL

## 2024-06-23 MED ORDER — SODIUM CHLORIDE (PF) 0.9 % IJ SOLN
INTRAMUSCULAR | Status: AC
  Filled 2024-06-23: qty 100

## 2024-06-23 MED ORDER — ROCURONIUM BROMIDE 100 MG/10ML IV SOLN
INTRAVENOUS | Status: DC | PRN
  Administered 2024-06-23: 09:00:00 30 mg via INTRAVENOUS
  Administered 2024-06-23: 11:00:00 10 mg via INTRAVENOUS
  Administered 2024-06-23: 10:00:00 30 mg via INTRAVENOUS
  Administered 2024-06-23: 08:00:00 60 mg via INTRAVENOUS

## 2024-06-23 MED ORDER — PROPOFOL 10 MG/ML IV BOLUS
INTRAVENOUS | Status: DC | PRN
  Administered 2024-06-23: 08:00:00 150 mg via INTRAVENOUS

## 2024-06-23 MED ORDER — EPHEDRINE SULFATE (PRESSORS) 25 MG/5ML IV SOSY
PREFILLED_SYRINGE | INTRAVENOUS | Status: DC | PRN
  Administered 2024-06-23: 11:00:00 5 mg via INTRAVENOUS
  Administered 2024-06-23 (×2): 10 mg via INTRAVENOUS

## 2024-06-23 MED ORDER — CEFAZOLIN SODIUM-DEXTROSE 2-4 GM/100ML-% IV SOLN
2.0000 g | INTRAVENOUS | Status: AC
  Administered 2024-06-23: 08:00:00 2 g via INTRAVENOUS

## 2024-06-23 MED ORDER — ACETAMINOPHEN 500 MG PO TABS
ORAL_TABLET | ORAL | Status: AC
  Filled 2024-06-23: qty 2

## 2024-06-23 MED ORDER — FENTANYL CITRATE (PF) 100 MCG/2ML IJ SOLN
50.0000 ug | Freq: Once | INTRAMUSCULAR | Status: AC
  Administered 2024-06-23: 07:00:00 50 ug via INTRAVENOUS

## 2024-06-23 MED ORDER — NITROGLYCERIN 2 % TD OINT
TOPICAL_OINTMENT | TRANSDERMAL | Status: DC | PRN
  Administered 2024-06-23: 12:00:00 .25 [in_us] via TOPICAL

## 2024-06-23 MED ORDER — ACETAMINOPHEN 500 MG PO TABS
1000.0000 mg | ORAL_TABLET | Freq: Four times a day (QID) | ORAL | Status: DC
  Administered 2024-06-23 – 2024-06-24 (×3): 1000 mg via ORAL
  Filled 2024-06-23 (×3): qty 2

## 2024-06-23 MED ADMIN — Albumin, Human Inj 5%: 12.5 g | INTRAVENOUS | @ 16:00:00 | NDC 44206031090

## 2024-06-23 MED ADMIN — Dexmedetomidine HCl in NaCl 0.9% IV Soln 80 MCG/20ML: 8 ug | INTRAVENOUS | @ 12:00:00 | NDC 00781349395

## 2024-06-23 MED FILL — Propofol IV Emul 200 MG/20ML (10 MG/ML): INTRAVENOUS | Qty: 20 | Status: AC

## 2024-06-23 MED FILL — Rocuronium Bromide IV Soln Pref Syr 100 MG/10ML (10 MG/ML): INTRAVENOUS | Qty: 20 | Status: AC

## 2024-06-23 MED FILL — Ondansetron HCl Inj 4 MG/2ML (2 MG/ML): INTRAMUSCULAR | Qty: 2 | Status: AC

## 2024-06-23 MED FILL — Tranexamic Acid IV Soln 1000 MG/10ML (100 MG/ML): INTRAVENOUS | Qty: 60 | Status: AC

## 2024-06-23 SURGICAL SUPPLY — 100 items
BAG DECANTER FOR FLEXI CONT (MISCELLANEOUS) ×3 IMPLANT
BENZOIN TINCTURE PRP APPL 2/3 (GAUZE/BANDAGES/DRESSINGS) IMPLANT
BINDER BREAST LRG (GAUZE/BANDAGES/DRESSINGS) IMPLANT
BINDER BREAST MEDIUM (GAUZE/BANDAGES/DRESSINGS) IMPLANT
BINDER BREAST XLRG (GAUZE/BANDAGES/DRESSINGS) IMPLANT
BINDER BREAST XXLRG (GAUZE/BANDAGES/DRESSINGS) IMPLANT
BIOPATCH RED 1 DISK 7.0 (GAUZE/BANDAGES/DRESSINGS) ×3 IMPLANT
BLADE CLIPPER SURG (BLADE) IMPLANT
BLADE HEX COATED 2.75 (ELECTRODE) ×3 IMPLANT
BLADE SURG 10 STRL SS (BLADE) ×3 IMPLANT
BLADE SURG 15 STRL LF DISP TIS (BLADE) ×3 IMPLANT
BNDG GAUZE DERMACEA FLUFF 4 (GAUZE/BANDAGES/DRESSINGS) ×6 IMPLANT
CANISTER SUCT 1200ML W/VALVE (MISCELLANEOUS) ×3 IMPLANT
CHLORAPREP W/TINT 26 (MISCELLANEOUS) ×3 IMPLANT
CLIP APPLIE 11 MED OPEN (CLIP) IMPLANT
CLIP APPLIE 9.375 MED OPEN (MISCELLANEOUS) IMPLANT
CLIP TI WIDE RED SMALL 6 (CLIP) IMPLANT
COVER BACK TABLE 60X90IN (DRAPES) ×3 IMPLANT
COVER MAYO STAND STRL (DRAPES) ×3 IMPLANT
COVER PROBE W GEL 5X96 (DRAPES) ×3 IMPLANT
DERMABOND ADVANCED .7 DNX12 (GAUZE/BANDAGES/DRESSINGS) ×3 IMPLANT
DRAIN CHANNEL 15F RND FF W/TCR (WOUND CARE) IMPLANT
DRAIN CHANNEL 19F RND (DRAIN) ×3 IMPLANT
DRAPE LAPAROSCOPIC ABDOMINAL (DRAPES) ×3 IMPLANT
DRAPE TOP ARMCOVERS (MISCELLANEOUS) ×3 IMPLANT
DRAPE U-SHAPE 76X120 STRL (DRAPES) ×3 IMPLANT
DRAPE UTILITY XL STRL (DRAPES) ×3 IMPLANT
DRSG IV CHG TEGADERM 3.5X4.5 (GAUZE/BANDAGES/DRESSINGS) IMPLANT
DRSG OPSITE POSTOP 4X6 (GAUZE/BANDAGES/DRESSINGS) IMPLANT
DRSG TEGADERM 4X4.75 (GAUZE/BANDAGES/DRESSINGS) IMPLANT
ELECT COATED BLADE 2.86 ST (ELECTRODE) IMPLANT
ELECTRODE BLDE 4.0 EZ CLN MEGD (MISCELLANEOUS) ×3 IMPLANT
ELECTRODE REM PT RTRN 9FT ADLT (ELECTROSURGICAL) ×3 IMPLANT
EVACUATOR SILICONE 100CC (DRAIN) ×3 IMPLANT
FUNNEL KELLER 2 DISP (MISCELLANEOUS) IMPLANT
GAUZE PAD ABD 8X10 STRL (GAUZE/BANDAGES/DRESSINGS) ×6 IMPLANT
GAUZE SPONGE 4X4 12PLY STRL LF (GAUZE/BANDAGES/DRESSINGS) IMPLANT
GLOVE BIO SURGEON STRL SZ 6.5 (GLOVE) IMPLANT
GLOVE BIO SURGEON STRL SZ7 (GLOVE) ×3 IMPLANT
GLOVE BIO SURGEON STRL SZ7.5 (GLOVE) ×6 IMPLANT
GLOVE BIO SURGEON STRL SZ8 (GLOVE) IMPLANT
GLOVE BIOGEL PI IND STRL 6.5 (GLOVE) IMPLANT
GLOVE BIOGEL PI IND STRL 7.0 (GLOVE) IMPLANT
GLOVE BIOGEL PI IND STRL 7.5 (GLOVE) ×3 IMPLANT
GLOVE BIOGEL PI IND STRL 8 (GLOVE) ×3 IMPLANT
GLOVE ECLIPSE 6.5 STRL STRAW (GLOVE) IMPLANT
GOWN STRL REUS W/ TWL LRG LVL3 (GOWN DISPOSABLE) ×9 IMPLANT
GOWN STRL REUS W/ TWL XL LVL3 (GOWN DISPOSABLE) IMPLANT
HEMOSTAT ARISTA ABSORB 3G PWDR (HEMOSTASIS) IMPLANT
IMPL EXPANDER BREAST 475CC (Breast) IMPLANT
IV NS 1000ML BAXH (IV SOLUTION) IMPLANT
IV NS 500ML BAXH (IV SOLUTION) ×3 IMPLANT
KIT FILL ASEPTIC TRANSFER (MISCELLANEOUS) IMPLANT
LIGHT WAVEGUIDE WIDE FLAT (MISCELLANEOUS) ×3 IMPLANT
MESH OVITEX RESRB RECT 20X20.5 (Mesh General) IMPLANT
NDL HYPO 25X1 1.5 SAFETY (NEEDLE) IMPLANT
NDL SAFETY ECLIPSE 18X1.5 (NEEDLE) IMPLANT
NEEDLE HYPO 25X1 1.5 SAFETY (NEEDLE) IMPLANT
PACK BASIN DAY SURGERY FS (CUSTOM PROCEDURE TRAY) ×3 IMPLANT
PACK SPY-PHI (KITS) IMPLANT
PACK UNIVERSAL I (CUSTOM PROCEDURE TRAY) IMPLANT
PAD FOAM SILICONE BACKED (GAUZE/BANDAGES/DRESSINGS) IMPLANT
PENCIL SMOKE EVACUATOR (MISCELLANEOUS) ×3 IMPLANT
PIN SAFETY STERILE (MISCELLANEOUS) ×3 IMPLANT
RETRACTOR ONETRAX LX 90X20 (MISCELLANEOUS) IMPLANT
SHEET MEDIUM DRAPE 40X70 STRL (DRAPES) IMPLANT
SLEEVE SCD COMPRESS KNEE MED (STOCKING) ×3 IMPLANT
SOLN 0.9% NACL POUR BTL 1000ML (IV SOLUTION) ×3 IMPLANT
SPIKE FLUID TRANSFER (MISCELLANEOUS) IMPLANT
SPONGE T-LAP 18X18 ~~LOC~~+RFID (SPONGE) ×6 IMPLANT
SPONGE T-LAP 4X18 ~~LOC~~+RFID (SPONGE) IMPLANT
STAPLER SKIN PROX WIDE 3.9 (STAPLE) IMPLANT
STRIP CLOSURE SKIN 1/2X4 (GAUZE/BANDAGES/DRESSINGS) IMPLANT
STRIP SUTURE WOUND CLOSURE 1/2 (MISCELLANEOUS) IMPLANT
SUT ETHILON 2 0 FS 18 (SUTURE) ×3 IMPLANT
SUT ETHILON 3 0 PS 1 (SUTURE) IMPLANT
SUT MNCRL AB 4-0 PS2 18 (SUTURE) ×3 IMPLANT
SUT MON AB 3-0 SH27 (SUTURE) ×3 IMPLANT
SUT MON AB 5-0 PS2 18 (SUTURE) IMPLANT
SUT PDS AB 2-0 CT2 27 (SUTURE) IMPLANT
SUT PDS II 3-0 CT2 27 ABS (SUTURE) IMPLANT
SUT PLAIN GUT FAST 5-0 (SUTURE) IMPLANT
SUT PROLENE 2 0 CT2 30 (SUTURE) IMPLANT
SUT SILK 2 0 SH (SUTURE) IMPLANT
SUT SILK 3 0 PS 1 (SUTURE) ×3 IMPLANT
SUT VIC AB 0 CT1 27XCR 8 STRN (SUTURE) IMPLANT
SUT VIC AB 2-0 SH 27XBRD (SUTURE) ×6 IMPLANT
SUT VIC AB 3-0 54X BRD REEL (SUTURE) IMPLANT
SUT VIC AB 3-0 SH 27X BRD (SUTURE) IMPLANT
SUT VICRYL 3-0 CR8 SH (SUTURE) ×3 IMPLANT
SYR BULB EAR ULCER 3OZ GRN STR (SYRINGE) IMPLANT
SYR BULB IRRIG 60ML STRL (SYRINGE) ×3 IMPLANT
SYR CONTROL 10ML LL (SYRINGE) IMPLANT
TOWEL GREEN STERILE FF (TOWEL DISPOSABLE) ×6 IMPLANT
TRACER MAGTRACE VIAL (MISCELLANEOUS) IMPLANT
TRAY DSU PREP LF (CUSTOM PROCEDURE TRAY) ×3 IMPLANT
TRAY FOLEY W/BAG SLVR 14FR LF (SET/KITS/TRAYS/PACK) IMPLANT
TUBE CONNECTING 20X1/4 (TUBING) ×3 IMPLANT
UNDERPAD 30X36 HEAVY ABSORB (UNDERPADS AND DIAPERS) ×6 IMPLANT
YANKAUER SUCT BULB TIP NO VENT (SUCTIONS) ×3 IMPLANT

## 2024-06-23 NOTE — Op Note (Signed)
 Preoperative diagnosis: Clinical stage I right breast cancer Postoperative diagnosis: Same as above Procedure: 1.  Left risk-reducing mastectomy 2.  Right mastectomy 3.  Injection of mag trace for sentinel lymph node identification 4.  Right deep axillary sentinel lymph node biopsy Surgeon: Dr. [name] Anesthesia: General With bilateral pectoral blocks Estimated blood loss: 100 cc Specimens: 1.  Left risk-reducing mastectomy marked short superior, long lateral 2.  Right mastectomy marked short superior, long lateral 3.  Right deep axillary sentinel lymph nodes Complications: None Drains: Per plastic surgery Sponge needle count was correct at the end of my portion Disposition Case turned over to plastic surgery for reconstruction  Indications: This a 58 year old female I know well from prior surgery for breast cancer.  She then had a couple of areas of non-mass and its enhancement on imaging that were biopsied and showed a grade 2 invasive ductal carcinoma is ER positive and PR negative.  She elected to proceed with bilateral mastectomy and we were going to do a right axillary sentinel lymph node biopsy.  This will be combined with plastic surgery reconstruction.  Procedure: After informed consent was obtained she first underwent bilateral pectoral blocks.  She was given antibiotics.  SCDs were placed.  She was placed under general anesthesia without complication.  She was prepped and draped in a sterile sterile surgical fashion.  Surgical timeout was then performed.  I injected 2 cc of mag trace in the subareolar position on the right side.  I massaged this for 5 minutes.  I used the probe and it appeared to track to the axillary nodes.  I did the left mastectomy first.  Dr. Montorfano had already marked a Wise pattern on both breast.  I used this incision on the left side.  I then created flaps to the clavicle, parasternal region, inframammary fold and latissimus laterally.  She had  some scar tissue on this side that made a little bit more difficult.  I then remove the breast and the pectoralis fascia from the pectoralis muscle and passed this off the table.  I then obtained hemostasis.  I placed a TXA soaked sponge and evaluated this late area later again.  It was not bleeding.  I then packed the sponge in this area and turned my attention to the other side.  I did the same incision on the right sided mastectomy.  I created flaps in a similar fashion remove the breast tissue just as I did on the left side.  This was marked as above.  I then used the probe to identify what appeared to be a couple of axillary lymph nodes.  I remove these.  The count was 1583.  There is no background activity.  There were no brown or palpable nodes present either.  I then obtained hemostasis.  I placed a TXA soaked sponge in this side and I turned the case over to plastic surgery for reconstruction.

## 2024-06-23 NOTE — Interval H&P Note (Signed)
 History and Physical Interval Note:  06/23/2024 7:23 AM  Tara Yates  has presented today for surgery, with the diagnosis of RIGHT BREAST CANCER.  The various methods of treatment have been discussed with the patient and family. After consideration of risks, benefits and other options for treatment, the patient has consented to  Procedures with comments: MASTECTOMY, SIMPLE (Bilateral) - GEN w/ PEC BLOCK RIGHT MASTECTOMY LEFT RISK REDUCING MASTECTOMY BIOPSY, LYMPH NODE, SENTINEL, AXILLARY (Right) - RIGHT AXILLARY SENTINEL NODE BIOPSY INSERTION, TISSUE EXPANDER (Bilateral) ADJACENT TISSUE TRANSFER (Bilateral) as a surgical intervention.  The patient's history has been reviewed, patient examined, no change in status, stable for surgery.  I have reviewed the patient's chart and labs.  Questions were answered to the patient's satisfaction.     Donnice Bury

## 2024-06-23 NOTE — Anesthesia Procedure Notes (Signed)
 Procedure Name: Intubation Date/Time: 06/23/2024 7:50 AM  Performed by: Burnard Rosaline HERO, CRNAPre-anesthesia Checklist: Patient identified, Emergency Drugs available, Suction available and Patient being monitored Patient Re-evaluated:Patient Re-evaluated prior to induction Oxygen Delivery Method: Circle system utilized Preoxygenation: Pre-oxygenation with 100% oxygen Induction Type: IV induction Ventilation: Mask ventilation without difficulty Laryngoscope Size: Mac and 4 Grade View: Grade I Tube type: Oral Tube size: 7.0 mm Number of attempts: 1 Airway Equipment and Method: Stylet and Oral airway Placement Confirmation: ETT inserted through vocal cords under direct vision, positive ETCO2, breath sounds checked- equal and bilateral and CO2 detector Secured at: 23 cm Tube secured with: Tape Dental Injury: Teeth and Oropharynx as per pre-operative assessment

## 2024-06-23 NOTE — Op Note (Addendum)
 Operative Note   DATE OF OPERATION: 06/23/2024  LOCATION: [facility]  SURGICAL DEPARTMENT: Plastic Surgery  PREOPERATIVE DIAGNOSES:  Breast cancer  POSTOPERATIVE DIAGNOSES:  same  PROCEDURE: Bilateral Breast reconstruction with insertion of tissue expanders (CPT CODE 19357-50). Right breast: [employer] Artoura High profile tissue expander 475cc REF: [id number]; SN: [id number] Left breast: Mentor Artoura High profile tissue expander 475cc REF: SCD130H; SN: [id number] Bilateral implantation of mesh for tissue reinforcement, Ovitex PRS biologic mesh 20 cm x 20.5 cm x2 (CPT CODE 15777-50) Injection ICG dye for assessment of vascular flow to bilateral mastectomy flaps via Fluorescein ICG SPY Angiography (CPT CODE 84139)  SURGEON: Nieves Client, MD  ASSISTANT: Estefana Peck, PA  ANESTHESIA:  General.   COMPLICATIONS: None.   EBL: < 100  INDICATIONS FOR PROCEDURE:  The patient, Tara Yates is a 58 y.o. female born on August 23, 1965, is here for treatment of breast cancer MRN: [id number]  CONSENT:  Informed consent was obtained directly from the patient. Risks, benefits and alternatives were fully discussed. Specific risks including but not limited to bleeding, infection, hematoma, seroma, scarring, pain, infection, contracture, asymmetry, wound healing problems, and need for further surgery were all discussed. The patient did have an ample opportunity to have questions answered to satisfaction.   DESCRIPTION OF PROCEDURE: The patient was brought into the OR with surgical oncology and placed on the operating room in a supine position. A formal surgical checklist was completed to ensure the correct patient was undergoing correct operation. The patient had SCDs on and functioning prior to induction. General anaesthesia was next induced and endotracheal intubation was performed. Preoperative antibiotics were administered to the patient. The patient was positioned with  bilateral arms out and all bony prominences were padded.  The beginning portion of the procedure will be dictated by Dr. Ebbie - please refer to his note for more information.  At conclusion of the  mastectomies, ICG SPY angiography demonstrated skin flap necrosis along the T-junctions of the bilateral mastectomy flaps. The skin in these areas was sharply excised and debrided until more clinically well perfused mastectomy skin was encountered.   Her chest wall diameter measured 13 cm and therefore the above tissue expanders were selected. They were opened from their packaging and soaked in triple antibiotic solution. The same procedure was performed on the other tissue expander.   Hemostasis was achieved within the right breast pocket and it was irrigated thoroughly with Vashe. One 43 French JP drain was placed within the right breast pocket and secured into place with a 3-0 nylon. The expander was fully expanded with air and placed face down on the mesh material. The edges of the mesh were wrapped around the device and small holes were created to accommodate exposure of the tabs All prominent folded edges of the mesh were trimmed. Air was removed from the Mesh/TE construct and was placed aside in antibiotic solution. Using 2-0 Prolene suture, several figure-of-eight sutures were placed in the chest wall in appropriate position for the TE tabs and tagged with needle driver. The TE was then parachuted into the field with care to avoid touching the skin. Sutures were tied down tightly. The tissue expander was not filled due to skin being a bit congested. \]Nitropaste & Bacitracin  applied to the skin.   The same procedure was performed on the left breast. Hemostasis was achieved within the left breast pocket and it was irrigated thoroughly with Vashe. One 18 French JP drain was placed within the right breast pocket and  secured into place with a 3-0 nylon. The expander was fully expanded with air and  placed face down on the mesh material. The edges of the mesh were wrapped around the device and small holes were created to accommodate exposure of the tabs and it was secured with 0 Vycrils. All prominent folded edges of the mesh were trimmed. Air was removed from the Mesh/TE construct and was placed aside in antibiotic solution. Using 2-0 Prolene suture, several figure-of-eight sutures were placed in the chest wall in appropriate position for the TE tabs and tagged with needle driver. The TE was then parachuted into the field with care to avoid touching the skin. Sutures were tied down tightly. The tissue expander was not filled due to skin being a bit congested. Nitropaste & Bacitracin  applied to the skin.   At the end of the case, SPY imaging demonstrated delayed perfusion to the T junction bilaterally but still lit up on imaging and so it was deemed clinically adequate perfusion to be able to leave the tissue expanders in place.   The entire chest was cleansed with lap sponge soaked in antibiotic solution and dried. Dermabond and steri strips were placed on the incisions. The drains were dressed with BioPatches and CHG Tegaderm dressings. Symmetry, as assessed on the table, was acceptable.    She was awakened from general anesthesia and extubated without incident. All sponge, instrument and needle counts were correct at the conclusion of the procedure. Patient tolerated the procedure well with no known complications. Surgical wounds were dressed with Kerlix fluff gauze, ABD and loose-fitting surgical bra.  The advanced practice practitioner (APP) assisted throughout the case.  The APP was essential in retraction and counter traction when needed to make the case progress smoothly.  This retraction and assistance made it possible to see the tissue plans for the procedure.  The assistance was needed for blood control, tissue re-approximation and assisted with closure of the incision  site.  DISPOSITION: The patient tolerated the procedure well and was transferred to PACU in a stable, satisfactory condition. The patient tolerated the procedure well.  There were no complications. The patient was allowed to wake from anesthesia, extubated and taken to the recovery room in satisfactory condition.   Carmelia Tiner M. Squire Withey, MD [facility]

## 2024-06-23 NOTE — Progress Notes (Signed)
Assisted Dr. Neoma Laming with left, right, pectoralis, ultrasound guided block. Side rails up, monitors on throughout procedure. See vital signs in flow sheet. Tolerated Procedure well.

## 2024-06-23 NOTE — Anesthesia Postprocedure Evaluation (Signed)
 Anesthesia Post Note  Patient: Tara Yates  Procedure(s) Performed: BILATERAL MASTECTOMY, SIMPLE (Bilateral: Breast) RIGHT AXILLARY SENTINEL LYMPH NODE BIOPSY (Right: Axilla) BILATERAL TISSUE EXPANDER PLACEMENT (Bilateral: Chest) ADJACENT TISSUE TRANSFER WITH MESH PLACEMENT (Bilateral: Chest)     Patient location during evaluation: PACU Anesthesia Type: General and Regional Level of consciousness: awake and alert Pain management: pain level controlled Vital Signs Assessment: post-procedure vital signs reviewed and stable Respiratory status: spontaneous breathing, nonlabored ventilation, respiratory function stable and patient connected to nasal cannula oxygen Cardiovascular status: blood pressure returned to baseline and stable Postop Assessment: no apparent nausea or vomiting Anesthetic complications: no   No notable events documented.  Last Vitals:  Vitals:   06/23/24 1610 06/23/24 1645  BP: (!) 98/53 (!) 100/49  Pulse: 95 87  Resp: 17 18  Temp:  36.6 C  SpO2: 93% 94%    Last Pain:  Vitals:   06/23/24 1709  TempSrc:   PainSc: 4                  [name] L Kristina Bertone

## 2024-06-23 NOTE — Interval H&P Note (Signed)
 History and Physical Interval Note:  06/23/2024 7:06 AM  Tara Yates  has presented today for surgery, with the diagnosis of RIGHT BREAST CANCER.  The various methods of treatment have been discussed with the patient and family. After consideration of risks, benefits and other options for treatment, the patient has consented to  Procedures with comments: MASTECTOMY, SIMPLE (Bilateral) - GEN w/ PEC BLOCK RIGHT MASTECTOMY LEFT RISK REDUCING MASTECTOMY BIOPSY, LYMPH NODE, SENTINEL, AXILLARY (Right) - RIGHT AXILLARY SENTINEL NODE BIOPSY INSERTION, TISSUE EXPANDER (Bilateral) ADJACENT TISSUE TRANSFER (Bilateral) as a surgical intervention.  The patient's history has been reviewed, patient examined, no change in status, stable for surgery.  I have reviewed the patient's chart and labs.  Questions were answered to the patient's satisfaction.     Henlee Donovan M Oris Staffieri

## 2024-06-23 NOTE — Anesthesia Procedure Notes (Signed)
 Anesthesia Regional Block: Pectoralis block   Pre-Anesthetic Checklist: , timeout performed,  Correct Patient, Correct Site, Correct Laterality,  Correct Procedure, Correct Position, site marked,  Risks and benefits discussed,  Surgical consent,  Pre-op evaluation,  At surgeon's request and post-op pain management  Laterality: Left and Right  Prep: chloraprep       Needles:  Injection technique: Single-shot  Needle Type: Echogenic Stimulator Needle     Needle Length: 9cm  Needle Gauge: 21     Additional Needles:   Procedures:,,,, ultrasound used (permanent image in chart),,    Narrative:  Start time: 06/23/2024 6:57 AM End time: 06/23/2024 7:04 AM Injection made incrementally with aspirations every 5 mL.  Performed by: Personally  Anesthesiologist: Peggye Delon Brunswick, MD  Additional Notes: Discussed risks and benefits of nerve block including, but not limited to, prolonged and/or permanent nerve injury involving sensory and/or motor function. Monitors were applied and a time-out was performed. The nerve and associated structures were visualized under ultrasound guidance. After negative aspiration, local anesthetic was slowly injected around the nerve. There was no evidence of high pressure during the procedure. There were no paresthesias. VSS remained stable and the patient tolerated the procedure well.

## 2024-06-23 NOTE — Progress Notes (Signed)
 Pt BP MAP less than 65 x2 readings after bolus per Dr Dasie. Wiped off nitropaste from bilateral breasts per Dr Montorfano. Dr Montorfano notified.

## 2024-06-23 NOTE — Transfer of Care (Signed)
 Immediate Anesthesia Transfer of Care Note  Patient: Tara Yates  Procedure(s) Performed: BILATERAL MASTECTOMY, SIMPLE (Bilateral: Breast) RIGHT AXILLARY SENTINEL LYMPH NODE BIOPSY (Right: Axilla) BILATERAL TISSUE EXPANDER PLACEMENT (Bilateral: Chest) ADJACENT TISSUE TRANSFER WITH MESH PLACEMENT (Bilateral: Chest)  Patient Location: PACU  Anesthesia Type:GA combined with regional for post-op pain  Level of Consciousness: drowsy and patient cooperative  Airway & Oxygen Therapy: Patient Spontanous Breathing and Patient connected to face mask oxygen  Post-op Assessment: Report given to RN and Post -op Vital signs reviewed and stable  Post vital signs: Reviewed and stable  Last Vitals:  Vitals Value Taken Time  BP 97/56 06/23/24 12:15  Temp 36.7 C 06/23/24 12:15  Pulse 91 06/23/24 12:22  Resp 15 06/23/24 12:22  SpO2 96 % 06/23/24 12:22  Vitals shown include unfiled device data.  Last Pain:  Vitals:   06/23/24 0629  TempSrc: Temporal  PainSc: 0-No pain      Patients Stated Pain Goal: 6 (06/23/24 9370)  Complications: No notable events documented.

## 2024-06-24 ENCOUNTER — Encounter (HOSPITAL_BASED_OUTPATIENT_CLINIC_OR_DEPARTMENT_OTHER): Payer: Self-pay | Admitting: General Surgery

## 2024-06-24 DIAGNOSIS — N6081 Other benign mammary dysplasias of right breast: Secondary | ICD-10-CM | POA: Diagnosis not present

## 2024-06-24 LAB — CBC WITH DIFFERENTIAL/PLATELET
Abs Immature Granulocytes: 0.03 K/uL (ref 0.00–0.07)
Basophils Absolute: 0 K/uL (ref 0.0–0.1)
Basophils Relative: 0 %
Eosinophils Absolute: 0 K/uL (ref 0.0–0.5)
Eosinophils Relative: 0 %
HCT: 28.4 % — ABNORMAL LOW (ref 36.0–46.0)
Hemoglobin: 9.5 g/dL — ABNORMAL LOW (ref 12.0–15.0)
Immature Granulocytes: 1 %
Lymphocytes Relative: 23 %
Lymphs Abs: 1.3 K/uL (ref 0.7–4.0)
MCH: 31.8 pg (ref 26.0–34.0)
MCHC: 33.5 g/dL (ref 30.0–36.0)
MCV: 95 fL (ref 80.0–100.0)
Monocytes Absolute: 0.5 K/uL (ref 0.1–1.0)
Monocytes Relative: 8 %
Neutro Abs: 3.9 K/uL (ref 1.7–7.7)
Neutrophils Relative %: 68 %
Platelets: 154 K/uL (ref 150–400)
RBC: 2.99 MIL/uL — ABNORMAL LOW (ref 3.87–5.11)
RDW: 12.6 % (ref 11.5–15.5)
WBC: 5.7 K/uL (ref 4.0–10.5)
nRBC: 0 % (ref 0.0–0.2)

## 2024-06-24 LAB — BASIC METABOLIC PANEL WITH GFR
Anion gap: 9 (ref 5–15)
BUN: 8 mg/dL (ref 6–20)
CO2: 27 mmol/L (ref 22–32)
Calcium: 8.3 mg/dL — ABNORMAL LOW (ref 8.9–10.3)
Chloride: 107 mmol/L (ref 98–111)
Creatinine, Ser: 0.68 mg/dL (ref 0.44–1.00)
GFR, Estimated: >60 mL/min
Glucose, Bld: 124 mg/dL — ABNORMAL HIGH (ref 70–99)
Potassium: 3.7 mmol/L (ref 3.5–5.1)
Sodium: 142 mmol/L (ref 135–145)

## 2024-06-24 MED ORDER — DOXYCYCLINE HYCLATE 100 MG PO CAPS: 100.0000 mg | ORAL_CAPSULE | Freq: Two times a day (BID) | ORAL | 0 refills | Status: DC

## 2024-06-24 NOTE — Discharge Summary (Addendum)
 Physician Discharge Summary  Patient ID: Tara Yates MRN: [id number] DOB/AGE: 56/10/1965 56 y.o.  Admit date: 06/23/2024 Discharge date: 06/24/2024  Admission Diagnoses: Breast cancer  Discharge Diagnoses:  Principal Problem:   Breast cancer [facility])   Discharged Condition: good  Hospital Course: Complicated  Consults: general surgery  Treatments: Status post bilateral mastectomies, right axillary sentinel lymph node biopsy (Dr. Ebbie) and bilateral tissue expander placement with mesh reinforcement and ICG angiography.   Discharge Exam: Blood pressure 99/65, pulse 72, temperature 98.4 F (36.9 C), resp. rate 20, height 5' 4 (1.626 m), weight 78.7 kg, last menstrual period 07/07/2018, SpO2 99%. On exam RN as chaperone patient is alert oriented x 3, hemodynamically normal, incisions are clean dry intact, JP drains with serosanguineous fluid adequate output.  Bilateral skin flaps well-perfused.  Disposition: Discharge disposition: 01-Home or Self Care       Discharge Instructions     Call MD for:  difficulty breathing, headache or visual disturbances   Complete by: As directed    Call MD for:  extreme fatigue   Complete by: As directed    Call MD for:  hives   Complete by: As directed    Call MD for:  persistant dizziness or light-headedness   Complete by: As directed    Call MD for:  persistant nausea and vomiting   Complete by: As directed    Call MD for:  redness, tenderness, or signs of infection (pain, swelling, redness, odor or green/yellow discharge around incision site)   Complete by: As directed    Call MD for:  severe uncontrolled pain   Complete by: As directed    Call MD for:  temperature >100.4   Complete by: As directed    Diet general   Complete by: As directed    Increase activity slowly   Complete by: As directed    No wound care   Complete by: As directed       Allergies as of 06/24/2024       Reactions   Short Ragweed Pollen Ext  Other (See Comments)        Medication List     TAKE these medications    acetaminophen  500 MG tablet Commonly known as: TYLENOL  Take 1 tablet (500 mg total) by mouth every 6 (six) hours as needed.   ALIGN PO Take by mouth.   anastrozole  1 MG tablet Commonly known as: ARIMIDEX  TAKE 1 TABLET BY MOUTH EVERY DAY   BIOTIN MAXIMUM PO Take by mouth.   CALCIUM 600 + D PO Take by mouth.   cholecalciferol 25 MCG (1000 UNIT) tablet Commonly known as: VITAMIN D3 Take 1,000 Units by mouth daily.   cyclobenzaprine  5 MG tablet Commonly known as: FLEXERIL  Take 1 tablet (5 mg total) by mouth 3 (three) times daily as needed for muscle spasms.   doxycycline 100 MG capsule Commonly known as: VIBRAMYCIN Take 1 capsule (100 mg total) by mouth 2 (two) times daily for 14 days.   ibuprofen  600 MG tablet Commonly known as: ADVIL  Take 1 tablet (600 mg total) by mouth every 8 (eight) hours as needed.   LAVENDER OIL PO Take by mouth.   levothyroxine  25 MCG tablet Commonly known as: SYNTHROID  TAKE 1 TABLET BY MOUTH EVERY DAY BEFORE BREAKFAST   magnesium gluconate 500 (27 Mg) MG Tabs tablet Commonly known as: MAGONATE Take 500 mg by mouth in the morning and at bedtime.   Multi-Vitamin tablet Take by mouth.   OMEGA 3 FISH  OIL PO Take by mouth.   ondansetron  4 MG disintegrating tablet Commonly known as: ZOFRAN -ODT Take 1 tablet (4 mg total) by mouth every 8 (eight) hours as needed for nausea or vomiting.   oxyCODONE  5 MG immediate release tablet Commonly known as: Roxicodone  Take 1 tablet (5 mg total) by mouth every 4 (four) hours as needed for severe pain (pain score 7-10).   VITAMIN K2 PO Take by mouth.       Signed: Louden Houseworth M Saleemah Mollenhauer 06/24/2024, 7:54 AM

## 2024-06-24 NOTE — Discharge Instructions (Addendum)
 Activity (include date of return to work if known) As tolerated: NO showers. Ok to sponge bath NO driving No heavy activities  Diet:regular No restrictions  Wound Care: Keep dressing clean & dry  Do not change dressings Special Instructions: Do not raise arms up Continue to empty, recharge, & record drainage from J-P drains &/or Hemovacs 2-3 times a day, as needed. Call Doctor if any unusual problems occur such as pain, excessive Bleeding, unrelieved Nausea/vomiting, Fever &/or chills When lying down, keep head elevated on 2-3 pillows or back-rest Follow-up appointment: Please call the office.  The patient received discharge instruction from:___________________________________________   Patient signature ________________________________________ / Date___________    Signature of individual providing instructions/ Date________________        Post Anesthesia Home Care Instructions  Activity: Get plenty of rest for the remainder of the day. A responsible individual must stay with you for 24 hours following the procedure.  For the next 24 hours, DO NOT: -Drive a car -Advertising copywriter -Drink alcoholic beverages -Take any medication unless instructed by your physician -Make any legal decisions or sign important papers.  Meals: Start with liquid foods such as gelatin or soup. Progress to regular foods as tolerated. Avoid greasy, spicy, heavy foods. If nausea and/or vomiting occur, drink only clear liquids until the nausea and/or vomiting subsides. Call your physician if vomiting continues.  Special Instructions/Symptoms: Your throat may feel dry or sore from the anesthesia or the breathing tube placed in your throat during surgery. If this causes discomfort, gargle with warm salt water . The discomfort should disappear within 24 hours.  If you had a scopolamine  patch placed behind your ear for the management of post- operative nausea and/or vomiting:  1. The medication in the  patch is effective for 72 hours, after which it should be removed.  Wrap patch in a tissue and discard in the trash. Wash hands thoroughly with soap and water . 2. You may remove the patch earlier than 72 hours if you experience unpleasant side effects which may include dry mouth, dizziness or visual disturbances. 3. Avoid touching the patch. Wash your hands with soap and water  after contact with the patch.             No tylenol  until after 1:15pm No ibuprofen /motrin /NSAIDs until after 11am No zofran  until after 9am

## 2024-06-24 NOTE — Progress Notes (Addendum)
 Patient seen and examined this morning. Status post bilateral mastectomies, right axillary sentinel lymph node biopsy (Dr. Ebbie) and bilateral tissue expander placement with mesh reinforcement and ICG angiography. Pain has been under control.  Patient has been tolerating diet. Had an episode of hypotension yesterday after surgery, Nitropaste was applied of the flaps and blood pressure improved.  Now within normal limits. On exam RN as chaperone patient is alert oriented x 3, hemodynamically normal, incisions are clean dry intact, JP drains with serosanguineous fluid adequate output.  Bilateral skin flaps well-perfused.  Patient is okay to be discharged home, we discussed postoperative care with patient and husband.  Patient was allowed to ask questions.  All his questions were answered to patient's satisfaction.  Instructed to call with any questions or concerns.  Patient will follow-up with me 1 to 2 weeks to start up expansions.  Kenedi Cilia M. Willodean Leven, MD [facility]

## 2024-06-25 NOTE — Progress Notes (Signed)
 Patient called the on-call service stating that she was having some burning sensation that was coming and going. Discussed with the patient that this is most likely nerve pain and should resolve with time. Also discuss that the gabapentin  should help with this. Cheeks misunderstanding. Patient also states that she developed ringing in her ears this afternoon. She denies any headaches, changes in her vision, chest pain or shortness of breath. I discussed with her that she should go to an emergency room or an urgent care to have this evaluated. Patient expressed understanding.

## 2024-06-26 ENCOUNTER — Ambulatory Visit (HOSPITAL_COMMUNITY)

## 2024-06-27 ENCOUNTER — Emergency Department (HOSPITAL_BASED_OUTPATIENT_CLINIC_OR_DEPARTMENT_OTHER)
Admission: EM | Admit: 2024-06-27 | Discharge: 2024-06-27 | Disposition: A | Discharge status: Home / Self Care | Attending: Emergency Medicine | Admitting: Emergency Medicine

## 2024-06-27 ENCOUNTER — Inpatient Hospital Stay: Attending: Hematology | Admitting: Hematology

## 2024-06-27 ENCOUNTER — Encounter (HOSPITAL_BASED_OUTPATIENT_CLINIC_OR_DEPARTMENT_OTHER): Payer: Self-pay

## 2024-06-27 ENCOUNTER — Other Ambulatory Visit: Payer: Self-pay

## 2024-06-27 DIAGNOSIS — Y828 Other medical devices associated with adverse incidents: Secondary | ICD-10-CM | POA: Insufficient documentation

## 2024-06-27 DIAGNOSIS — T8579XA Infection and inflammatory reaction due to other internal prosthetic devices, implants and grafts, initial encounter: Secondary | ICD-10-CM | POA: Diagnosis present

## 2024-06-27 DIAGNOSIS — Z79899 Other long term (current) drug therapy: Secondary | ICD-10-CM | POA: Insufficient documentation

## 2024-06-27 DIAGNOSIS — T85848A Pain due to other internal prosthetic devices, implants and grafts, initial encounter: Secondary | ICD-10-CM

## 2024-06-27 LAB — CBC WITH DIFFERENTIAL/PLATELET
Abs Immature Granulocytes: 0.01 K/uL (ref 0.00–0.07)
Basophils Absolute: 0 K/uL (ref 0.0–0.1)
Basophils Relative: 0 %
Eosinophils Absolute: 0.2 K/uL (ref 0.0–0.5)
Eosinophils Relative: 5 %
HCT: 34 % — ABNORMAL LOW (ref 36.0–46.0)
Hemoglobin: 11.5 g/dL — ABNORMAL LOW (ref 12.0–15.0)
Immature Granulocytes: 0 %
Lymphocytes Relative: 24 %
Lymphs Abs: 1.1 K/uL (ref 0.7–4.0)
MCH: 31.9 pg (ref 26.0–34.0)
MCHC: 33.8 g/dL (ref 30.0–36.0)
MCV: 94.4 fL (ref 80.0–100.0)
Monocytes Absolute: 0.4 K/uL (ref 0.1–1.0)
Monocytes Relative: 9 %
Neutro Abs: 2.9 K/uL (ref 1.7–7.7)
Neutrophils Relative %: 62 %
Platelets: 199 K/uL (ref 150–400)
RBC: 3.6 MIL/uL — ABNORMAL LOW (ref 3.87–5.11)
RDW: 12.5 % (ref 11.5–15.5)
WBC: 4.7 K/uL (ref 4.0–10.5)
nRBC: 0 % (ref 0.0–0.2)

## 2024-06-27 LAB — BASIC METABOLIC PANEL WITH GFR
Anion gap: 11 (ref 5–15)
BUN: 13 mg/dL (ref 6–20)
CO2: 27 mmol/L (ref 22–32)
Calcium: 9.4 mg/dL (ref 8.9–10.3)
Chloride: 104 mmol/L (ref 98–111)
Creatinine, Ser: 0.61 mg/dL (ref 0.44–1.00)
GFR, Estimated: >60 mL/min
Glucose, Bld: 119 mg/dL — ABNORMAL HIGH (ref 70–99)
Potassium: 4.5 mmol/L (ref 3.5–5.1)
Sodium: 142 mmol/L (ref 135–145)

## 2024-06-27 LAB — SURGICAL PATHOLOGY

## 2024-06-27 MED ORDER — ACETAMINOPHEN 500 MG PO TABS
500.0000 mg | ORAL_TABLET | Freq: Once | ORAL | Status: AC
  Administered 2024-06-27: 500 mg via ORAL
  Filled 2024-06-27: qty 1

## 2024-06-27 MED ORDER — BACITRACIN ZINC 500 UNIT/GM EX OINT: 1.0000 | TOPICAL_OINTMENT | Freq: Two times a day (BID) | CUTANEOUS | 0 refills | Status: AC

## 2024-06-27 MED ORDER — BACITRACIN ZINC 500 UNIT/GM EX OINT
TOPICAL_OINTMENT | Freq: Two times a day (BID) | CUTANEOUS | Status: DC
  Administered 2024-06-27: 1 via TOPICAL
  Filled 2024-06-27: qty 28.35

## 2024-06-27 MED ORDER — NITROGLYCERIN 2 % TD OINT: 0.5000 [in_us] | TOPICAL_OINTMENT | Freq: Two times a day (BID) | TRANSDERMAL | 0 refills | Status: AC

## 2024-06-27 MED ORDER — NITROGLYCERIN 2 % TD OINT
0.5000 [in_us] | TOPICAL_OINTMENT | Freq: Once | TRANSDERMAL | Status: AC
  Administered 2024-06-27: 0.5 [in_us] via TOPICAL
  Filled 2024-06-27: qty 1

## 2024-06-27 NOTE — ED Triage Notes (Signed)
 Reports double mastectomy on thursday.  Red spot noted to left surgical area. States bilateral chest swelling starting today. Denies fevers.  2 drains placed at site. Red drainage.  Plastic surgeon  appt tomorrow.

## 2024-06-27 NOTE — ED Notes (Signed)
 Pt ambulated in hallway w/ NT. Pt denied dizziness, CP, SOB.

## 2024-06-27 NOTE — Telephone Encounter (Signed)
 Patient called the on-call service reporting that she went to the emergency room based on the recommendations of general surgery.  Called the patient.  Stated that she should be seen in the emergency room.  I did discuss with her that I reviewed the photos she sent over MyChart.  We discussed the possibility of irritation versus infection.  Encouraged her to be seen in the emergency room, and then follow-up in our clinic tomorrow.  Patient expressed understanding.

## 2024-06-27 NOTE — ED Provider Notes (Signed)
 " [place] EMERGENCY DEPARTMENT AT [facility] Provider Note   CSN: [id number] Arrival date & time: 06/27/24  1747     Patient presents with: Post-op Problem   Tara Yates is a 58 y.o. female.   HPI Patient status post placement of breast expander for mastectomy 4 days ago.  She is developing a purpleish red area to the top aspect of the expander on the left.  Patient has been in communication with her plastic surgeon Dr. Montoforano.  He recommended evaluation in the emergency department and has been monitoring via text images.  The concern was for possible tissue necrosis given that the area was already somewhat thinned and possibly susceptible.  Patient otherwise been well.  No fevers no chills no constitutional symptoms.    Prior to Admission medications  Medication Sig Start Date End Date Taking? Authorizing Provider  acetaminophen  (TYLENOL ) 500 MG tablet Take 1 tablet (500 mg total) by mouth every 6 (six) hours as needed. 06/17/24   Montorfano, Lisandro M, MD  anastrozole  (ARIMIDEX ) 1 MG tablet TAKE 1 TABLET BY MOUTH EVERY DAY 05/10/24   [name], MD  BIOTIN MAXIMUM PO Take by mouth.    [provider]  Calcium Carb-Cholecalciferol (CALCIUM 600 + D PO) Take by mouth.    [provider]  cholecalciferol (VITAMIN D3) 25 MCG (1000 UNIT) tablet Take 1,000 Units by mouth daily.    [provider]  cyclobenzaprine  (FLEXERIL ) 5 MG tablet Take 1 tablet (5 mg total) by mouth 3 (three) times daily as needed for muscle spasms. 06/17/24   Montorfano, Lisandro M, MD  doxycycline  (VIBRAMYCIN ) 100 MG capsule Take 1 capsule (100 mg total) by mouth 2 (two) times daily for 14 days. 06/24/24 07/08/24  Montorfano, Lisandro M, MD  ibuprofen  (ADVIL ) 600 MG tablet Take 1 tablet (600 mg total) by mouth every 8 (eight) hours as needed. 06/17/24   Montorfano, Lisandro M, MD  LAVENDER OIL PO Take by mouth.    [provider]  levothyroxine  (SYNTHROID ) 25 MCG  tablet TAKE 1 TABLET BY MOUTH EVERY DAY BEFORE BREAKFAST 05/24/24   [name], MD  magnesium gluconate (MAGONATE) 500 (27 Mg) MG TABS tablet Take 500 mg by mouth in the morning and at bedtime.    [provider]  Menaquinone-7 (VITAMIN K2 PO) Take by mouth.    [provider]  Multiple Vitamin (MULTI-VITAMIN) tablet Take by mouth. 12/25/17   [provider]  Omega-3 Fatty Acids (OMEGA 3 FISH OIL PO) Take by mouth.    [provider]  ondansetron  (ZOFRAN -ODT) 4 MG disintegrating tablet Take 1 tablet (4 mg total) by mouth every 8 (eight) hours as needed for nausea or vomiting. 06/17/24   Montorfano, Lisandro M, MD  oxyCODONE  (ROXICODONE ) 5 MG immediate release tablet Take 1 tablet (5 mg total) by mouth every 4 (four) hours as needed for severe pain (pain score 7-10). 06/17/24   Montorfano, Lisandro M, MD  Probiotic Product (ALIGN PO) Take by mouth.    [provider]    Allergies: Short ragweed pollen ext    Review of Systems  Updated Vital Signs BP (!) 148/86 (BP Location: Left Arm)   Pulse 84   Temp 98.2 F (36.8 C)   Resp 16   LMP 07/07/2018   SpO2 98%   Physical Exam Constitutional:      Comments: Alert nontoxic no acute distress  Pulmonary:     Effort: Pulmonary effort is normal.  Comments: Expander on left has some violaceous hue to the tissue over the top outer margin at about the 1 to 2 o'clock position.  See attached images Musculoskeletal:        General: Normal range of motion.  Skin:    General: Skin is warm and dry.  Neurological:     General: No focal deficit present.        (all labs ordered are listed, but only abnormal results are displayed) Labs Reviewed  CBC WITH DIFFERENTIAL/PLATELET - Abnormal; Notable for the following components:      Result Value   RBC 3.60 (*)    Hemoglobin 11.5 (*)    HCT 34.0 (*)    All other components within normal limits  BASIC METABOLIC PANEL WITH GFR - Abnormal;  Notable for the following components:   Glucose, Bld 119 (*)    All other components within normal limits    EKG: None  Radiology: No results found.   Procedures   Medications Ordered in the ED  acetaminophen  (TYLENOL ) tablet 500 mg (has no administration in time range)                                    Medical Decision Making Risk OTC drugs. Prescription drug management.  Patient nontoxic alert.  She has been in contact with her plastic surgeon and at this time they have made recommendations. Consult:Dr. Montorfano.  Recommends half inch Nitropaste mixed with bacitracin  ointment and applied to area of tissue over the expander to the upper outer edge where tissue is violaceous and somewhat erythematous.  Patient is going to be seen tomorrow.  Requests patient be prescribed Nitropaste ointment and bacitracin  for home use twice daily He had put Nitropaste on in the OR postoperative and patient became hypotensive and had to be removed.  Will trial in the ED to make sure patient tolerates it.  She is otherwise well in appearance.  No signs of secondary infection.  Patient has been on doxycycline .  Recheck 21: 30 systolic blood pressure 110.  Patient is been tolerating Nitropaste will try position change and ambulating.  Patient successfully ambulated without lightheadedness.  Will plan for discharge     Final diagnoses:  None    ED Discharge Orders     None          Armenta Canning, MD 06/27/24 2240  "

## 2024-06-27 NOTE — Discharge Instructions (Signed)
 1.  Mix equal parts bacitracin  and nitroglycerin  ointment.  Apply to the involved skin at the breast expander.  You may cover with a large nonadherent bandage. 2.  Follow-up with your plastic surgeon tomorrow as planned. 3.  If you feel very lightheaded, weak or close to passing out, remove the nitroglycerin  ointment immediately.  If it does not resolve, return to the emergency department.

## 2024-06-27 NOTE — Telephone Encounter (Signed)
 I spoke with the patient and asked for her to send photos. The patient denies fevers, but stated there is an area on her left side at the top of the expander that feels hot to the touch, red, and inflamed. She is added to the schedule for tomorrow at 3:15.

## 2024-06-28 ENCOUNTER — Ambulatory Visit (INDEPENDENT_AMBULATORY_CARE_PROVIDER_SITE_OTHER): Admitting: Physician Assistant

## 2024-06-28 DIAGNOSIS — Z9889 Other specified postprocedural states: Secondary | ICD-10-CM

## 2024-06-28 NOTE — Progress Notes (Signed)
 Patient is a pleasant 58 year old female with right breast cancer s/p bilateral mastectomy with placement of tissue expanders performed 06/23/2024 by Dr. Ebbie and Dr. Montorfano who presents to clinic for postoperative follow-up.  Reviewed operative report and 475 cc tissue expanders were placed, but not filled due to skin being a bit congested.  Nitropaste and bacitracin  applied to the skin.  She was then seen in the ED 06/27/2024.  At that time, there was concern for possible tissue necrosis given the mastectomy flaps.  Pictures were obtained and placed in chart.  She is taking doxycycline  antibiotics and was prescribed Nitropaste.  Today, patient is accompanied by husband at bedside.  She states that yesterday she had a burning sensation across the entire chest in addition to the purpleish hue which prompted her to go to the ED for evaluation.  She has since been informed that it was likely neuropathic.  Today she is doing okay.  She finally had a bowel movement.  She is tolerating p.o. intake without difficulty and ambulatory at home.  Denies any difficulty breathing, fevers, or leg swelling.  On exam, expanders are appropriately placed.  There is an area at the 2 o'clock position left reconstructed breast, away from the incisions, that demonstrates possible ischemia.  See pictures.  No palpable underlying fluid collection.  Superficial ulceration over the ridge of the expander at that location.  Mastectomy flap is thin.  Dr. Montorfano was able to personally evaluate patient at today's encounter.  Evidently the spy device was used intraoperatively which showed slight ischemia at that location intraoperatively.  The hope is that with Nitropaste application regularly there will be angiogenesis and blood flow from collaterals to perfuse the area and avoid it turning into a wound.  She understands that if it progresses to exposed expander she would need to return to the OR for removal and possible  transition to DIEP flap reconstruction.  Patient to continue with Nitropaste twice daily until she returns later this week.  Will then continue to reevaluate patient.  Will hold off on expander fill until her exam begins to improve.  Continue with activity modifications and compressive garments in interim.  Picture(s) obtained of the patient and placed in the chart were with the patient's or guardian's permission.

## 2024-07-01 ENCOUNTER — Ambulatory Visit

## 2024-07-01 ENCOUNTER — Other Ambulatory Visit: Payer: Self-pay

## 2024-07-01 VITALS — BP 121/75 | HR 87 | Ht 64.0 in | Wt 173.0 lb

## 2024-07-01 DIAGNOSIS — D0512 Intraductal carcinoma in situ of left breast: Secondary | ICD-10-CM

## 2024-07-01 DIAGNOSIS — C50411 Malignant neoplasm of upper-outer quadrant of right female breast: Secondary | ICD-10-CM

## 2024-07-01 DIAGNOSIS — Z09 Encounter for follow-up examination after completed treatment for conditions other than malignant neoplasm: Secondary | ICD-10-CM

## 2024-07-01 DIAGNOSIS — Z9889 Other specified postprocedural states: Secondary | ICD-10-CM

## 2024-07-01 MED ORDER — GABAPENTIN 300 MG PO CAPS: 300.0000 mg | ORAL_CAPSULE | Freq: Two times a day (BID) | ORAL | 0 refills | Status: DC

## 2024-07-01 NOTE — Progress Notes (Signed)
 Patient is a pleasant 58 year old female with right breast cancer s/p bilateral mastectomy with placement of tissue expanders performed 06/23/2024 by Dr. Ebbie and Dr. Montorfano who presents to clinic for postoperative follow-up.   She was seen most recently here in clinic on 07/01/2024.  At that time, her area of congestion appeared to be healing a bit on the left radiated side.  She was doing overall well from a postoperative standpoint.  Follow-up following week and continue with Nitropaste in the interim.  Can consider removal of drains depending on output.  Today, patient is accompanied by husband at bedside.  Drain output was approximately 35 cc from each drain yesterday.  She understands that they likely will need to remain in place for a few more days.  She has some pain towards the axillary regions bilaterally, but overall no considerable discomfort.  She does have some itching from underneath her drain bandages.  She asked if she can take antihistamines.  She did have some tinnitus earlier which she attributed to her NSAIDs which she has since stopped.  Patient denies any leg swelling, chest pain, difficulty breathing, or fevers.  On examination, expanders are appropriately placed.  Steri-Strips remain firmly intact with no incisional drainage noted.  The area of congestion has seemingly resolved.  She does still have small superficial ulceration on the peripheral curve of the expander at the 2 o'clock position, but mildly improved compared to last encounter.  She is using a pad there to help protect the skin.  Will continue to hold off on expander fill today.  Patient and husband to call the office once drain output is less than 30 cc consecutively for a few days.  At that time, we we will work her into our schedules for drain removal.

## 2024-07-01 NOTE — Progress Notes (Signed)
" ° °  Established Patient Office Visit  Subjective   Patient ID: Tara Yates, female    DOB: [birth year]  Age: 58 y.o. MRN: [id number]  Chief Complaint  Patient presents with   Post-op Follow-up    HPI 58 year old female with right breast cancer s/p bilateral mastectomy with placement of tissue expanders performed 06/23/2024 by Dr. Ebbie and Dr. Christofer Shen who presents to clinic for postoperative follow-up.  Accompanied by husband at bedside.  Patient has been doing overall well. Burning sensation has improved since last encounter but still present.  Patient requesting gabapentin  refill. Congested skin has been improving on the left radiated side, the small bruised area of skin seems to be improving as well with the current Nitropaste therapy. She is tolerating p.o. intake without difficulty and ambulatory at home.  Denies any difficulty breathing, fevers, or leg swelling.  Patient is currently finishing course of p.o. antibiotics.   Objective:     BP 121/75 (BP Location: Left Arm, Patient Position: Sitting, Cuff Size: Normal)   Pulse 87   Ht 5' 4 (1.626 m)   Wt 173 lb (78.5 kg)   LMP 07/07/2018   SpO2 96%   BMI 29.70 kg/m  BP Readings from Last 3 Encounters:  07/01/24 121/75  06/27/24 109/70  06/24/24 99/65      Physical Exam MA as chaperone Bilateral breast tissue expanders in place.  Incisions covered with Steri-Strips.  JP drains with serosanguineous fluid.  On the left breast skin is less congested, there is a small wound 1 cm x 1 cm and looks bruised but improving.  No exposed expander noticed.    Assessment & Plan:   Problem List Items Addressed This Visit       Other   Ductal carcinoma in situ of left breast   Malignant neoplasm of upper-outer quadrant of right female breast [facility])   Other Visit Diagnoses       Follow-up exam    -  Primary     S/P breast reconstruction          Patient to continue with Nitropaste twice daily until she returns next week.  Will then continue to reevaluate patient. Will hold off on expander fill until her skin flap health improves. Continue with activity modifications and postoperative garments in interim.  Axillary JP removed today.  We will potentially remove both JP drains that remain in the next follow-up appointment granted that they are less than 30 cc/day for 3 days.  Follow-up with PA in 1 week and with me in 2 weeks.  Adell Koval M Reef Achterberg, MD  "

## 2024-07-04 ENCOUNTER — Ambulatory Visit: Admitting: Physician Assistant

## 2024-07-04 VITALS — BP 119/77 | HR 71

## 2024-07-04 DIAGNOSIS — Z9889 Other specified postprocedural states: Secondary | ICD-10-CM

## 2024-07-05 NOTE — Telephone Encounter (Signed)
 I think that's just another auto-generated refill request that we can ignore until patient reaches out. Thank you

## 2024-07-08 ENCOUNTER — Telehealth: Payer: Self-pay

## 2024-07-08 ENCOUNTER — Encounter: Payer: Self-pay | Admitting: Student

## 2024-07-08 ENCOUNTER — Ambulatory Visit: Admitting: Student

## 2024-07-08 VITALS — BP 128/78 | HR 89

## 2024-07-08 DIAGNOSIS — Z9889 Other specified postprocedural states: Secondary | ICD-10-CM

## 2024-07-08 MED ORDER — GABAPENTIN 300 MG PO CAPS: 300.0000 mg | ORAL_CAPSULE | Freq: Two times a day (BID) | ORAL | 0 refills | Status: AC

## 2024-07-08 NOTE — Telephone Encounter (Signed)
 Returned patients call from VM requesting to be seen earlier due to lower output in drains. She is scheduled to come into today to see Honora at 11:00 AM.

## 2024-07-08 NOTE — Progress Notes (Signed)
 Patient is a 59 year old female who recently underwent bilateral mastectomies with bilateral breast reconstruction with insertion of tissue expanders with Dr. Montorfano on 06/23/2024.  Intraoperatively, patient had 475 cc expanders placed.  Patient is 2 weeks postop.  She presents to the clinic today for postoperative follow-up and possible drain removal.  Patient was last seen in the clinic on 07/04/2024.  At this visit, drain output was approximately 35 cc from each drain the day prior.  On exam, expanders were appropriately placed.  Steri-Strips were intact with no incisional drainage noted.  The area of congestion had seemingly resolved.  There was superficial ulceration on the peripheral curve of the expander at the 2 o'clock position, but mildly improved from the previous exam.  Plan was to continue to hold off on expander fill.  Patient and the husband were to call the office once the drain output was less than 30 consecutively for a few days.  Today, patient presents with her husband at bedside.  She reports she is overall doing well.  She states that she is having some discomfort in her right axilla, she states that she feels like the area is chafing.  She is requesting more gabapentin  given this ongoing pain.  She states that otherwise she is taking Tylenol .  She denies any fevers or chills.  She reports that her drain output has been 30 cc or less per drain for 24-hour period for the past 3 days.  Yeah she does not report any other issues or concerns.  Chaperone present on exam.  On exam, patient sitting upright in no acute distress.  Expanders are in place bilaterally.  There is a superficial abrasion to the left superior/lateral breast with resolving congestion.  There are otherwise no overlying skin changes.  There is no overlying erythema.  No obvious fluid collections on exam.  There does appear to be a little bit of scar tissue noted just above the left breast expander.  Steri-Strips are in  place over the incisions bilaterally.  JP drains are in place and functioning with serous drainage in the bulbs.  There are no signs of infection on exam.  JP drains were removed without any difficulty.  Patient tolerated well.  Drain sites were cleaned with Vashe and Vaseline was applied over the drain sites followed by Mepilex border dressing.  Discussed with the patient that given small superficial abrasion/resolving congestion, recommend we hold off on expander fill for now.  Discussed with her that if the area continues to resolve, an expander fill could possibly be considered next week.  She expressed understanding.  Discussed with the patient the importance of continuing with compression at all times and avoiding strenuous activities.  Recommended that she gently massage the scar tissue on her left side.  Patient expressed understanding.  Discussed with the patient that I can refill her gabapentin , but if her pain continues, she will need to follow-up with her PCP for ongoing management of her gabapentin /pain.  Also discussed with her that physical therapy at [facility] may be an option for her to help with pain.  She states that she is already planning on seeing them.  Discussed with the patient that she should not shower for at least 48 hours.  Discussed with her that she should apply Vaseline over the drain sites daily and apply gauze and tape.  Patient expressed understanding.  Patient to follow back up next week.  Instructed her to call with any questions or concerns.  Pictures were obtained  of the patient and placed in the chart with the patient's or guardian's permission.

## 2024-07-13 NOTE — Therapy (Incomplete)
 " OUTPATIENT PHYSICAL THERAPY BREAST CANCER POST OP FOLLOW UP   Patient Name: Tara Yates MRN: [id number] DOB:Jun 30, 1966, 59 y.o., female Today's Date: 07/13/2024  END OF SESSION:   Past Medical History:  Diagnosis Date   Allergy    Anxiety    Back pain    Breast cancer (HCC)    Constipation    Depression    Ductal carcinoma in situ of left breast 11/09/2020   Family history of breast cancer 11/09/2020   HSV (herpes simplex virus) anogenital infection    Hyperlipidemia    Hypothyroidism    Joint pain    Plantar fasciitis, bilateral    SOBOE (shortness of breath on exertion)    Past Surgical History:  Procedure Laterality Date   ADJACENT TISSUE TRANSFER/TISSUE REARRANGEMENT Bilateral 06/23/2024   Procedure: ADJACENT TISSUE TRANSFER WITH MESH PLACEMENT;  Surgeon: Montorfano, Lisandro M, MD;  Location: [facility];  Service: Plastics;  Laterality: Bilateral;   AXILLARY SENTINEL NODE BIOPSY Right 06/23/2024   Procedure: RIGHT AXILLARY SENTINEL LYMPH NODE BIOPSY;  Surgeon: [name], MD;  Location: [facility];  Service: General;  Laterality: Right;  RIGHT AXILLARY SENTINEL NODE BIOPSY   BREAST BIOPSY Right 02/28/2015   BREAST BIOPSY Left 07/11/2021   BREAST EXCISIONAL BIOPSY Right 03/29/2015   had seed placement as wel   BREAST LUMPECTOMY WITH RADIOACTIVE SEED LOCALIZATION Left 12/18/2020   Procedure: LEFT BREAST LUMPECTOMY WITH RADIOACTIVE SEED LOCALIZATION;  Surgeon: [name], MD;  Location: [facility];  Service: General;  Laterality: Left;   CARPAL TUNNEL RELEASE Right 2022   ENDOMETRIAL ABLATION     MOUTH SURGERY     RADIOACTIVE SEED GUIDED EXCISIONAL BREAST BIOPSY Right 04/03/2015   Procedure: RADIOACTIVE SEED GUIDED EXCISIONAL BREAST BIOPSY;  Surgeon: Cough Ebbie, MD;  Location: [facility];  Service: General;  Laterality: Right;   RADIOACTIVE SEED GUIDED EXCISIONAL BREAST BIOPSY Left  02/06/2022   Procedure: LEFT BREAST SEED GUIDED EXCISIONAL BIOPSY;  Surgeon: [name], MD;  Location: [facility];  Service: General;  Laterality: Left;  LMA   TISSUE EXPANDER PLACEMENT Bilateral 06/23/2024   Procedure: BILATERAL TISSUE EXPANDER PLACEMENT;  Surgeon: Montorfano, Lisandro M, MD;  Location: [facility];  Service: Plastics;  Laterality: Bilateral;   TOTAL MASTECTOMY Bilateral 06/23/2024   Procedure: BILATERAL MASTECTOMY, SIMPLE;  Surgeon: [name], MD;  Location: [facility];  Service: General;  Laterality: Bilateral;  GEN w/ PEC BLOCK RIGHT MASTECTOMY LEFT RISK REDUCING MASTECTOMY   Patient Active Problem List   Diagnosis Date Noted   Breast cancer (HCC) 06/23/2024   Malignant neoplasm of upper-outer quadrant of right female breast (HCC) 04/08/2024   History of breast biopsy 01/04/2021   Genetic testing 11/16/2020   Ductal carcinoma in situ of left breast 11/09/2020   Family history of breast cancer 11/09/2020   At risk for dehydration 06/13/2020   Mood disorder (HCC), with emotional eating 05/29/2020   Other hyperlipidemia 05/29/2020   Insulin resistance 05/29/2020   Vitamin D  deficiency 05/29/2020   Hyperlipidemia 05/15/2020   Other fatigue 05/15/2020   SOBOE (shortness of breath on exertion) 05/15/2020   GAD (generalized anxiety disorder) 05/15/2020   History of constipation 05/15/2020   Depression 05/15/2020   At risk for heart disease 05/15/2020   Plantar fasciitis 01/04/2020   Hypocholesterolemia 08/24/2018   Palpitations 03/19/2018   Adjustment disorder with depressed mood 10/01/2016   Hot flashes 05/20/2016   Preventative health care 05/20/2016  Depressive disorder 04/14/2016   Radial scar of breast 03/15/2015   Shingles rash 02/14/2015   Arthralgia 09/14/2014   Bilateral low back pain 09/14/2014   Bloating 09/14/2014   Constipation 09/14/2014   Hypothyroidism 09/14/2014   Insomnia 09/14/2014    PCP:  ***  REFERRING PROVIDER: ***  REFERRING DIAG: ***  THERAPY DIAG:  No diagnosis found.  Rationale for Evaluation and Treatment: {HABREHAB:27488}  ONSET DATE: ***  SUBJECTIVE:                                                                                                                                                                                           SUBJECTIVE STATEMENT: ***  PERTINENT HISTORY:  Bil Mastectomy 06/23/24 with expanders. 2 neg nodes removed.   PATIENT GOALS:  Reassess how my recovery is going related to arm function, pain, and swelling.  PAIN:  Are you having pain? {OPRCPAIN:27236}  PRECAUTIONS: Recent Surgery, {RIGHT/LEFT:21944} UE Lymphedema risk, {Therapy precautions:24002}  RED FLAGS: {PT Red Flags:29287}   ACTIVITY LEVEL / LEISURE: ***   OBJECTIVE:   PATIENT SURVEYS:  QUICK DASH: ***  OBSERVATIONS: ***  POSTURE:  ***  LYMPHEDEMA ASSESSMENT:   COPY ROM & LYMPHEDEMA ASSESSMENT TABLES FROM EVAL  Surgery type/Date: *** Number of lymph nodes removed: *** Current/past treatment (chemo, radiation, hormone therapy): *** Other symptoms:  Heaviness/tightness {yes/no:20286} Pain {yes/no:20286} Pitting edema {yes/no:20286} Infections {yes/no:20286} Decreased scar mobility {yes/no:20286} Stemmer sign {yes/no:20286}  PATIENT EDUCATION:  Education details: *** Person educated: {Person educated:25204} Education method: {Education Method:25205} Education comprehension: {Education Comprehension:25206}  HOME EXERCISE PROGRAM: Reviewed previously given post op HEP. ***  ASSESSMENT:  CLINICAL IMPRESSION: ***  Pt will benefit from skilled therapeutic intervention to improve on the following deficits: Decreased knowledge of precautions, impaired UE functional use, pain, decreased ROM, postural dysfunction.   PT treatment/interventions: ADL/Self care home management, {rehab planned interventions:25118::97110-Therapeutic  exercises,97530- Therapeutic [phone]- Neuromuscular re-education,97535- Self Rjmz,02859- Manual therapy,Patient/Family education}   GOALS: Goals reviewed with patient? {yes/no:20286}  GOALS MET AT EVAL:  GOALS Name Target Date Goal status  1 Pt will be able to verbalize understanding of pertinent lymphedema risk reduction practices relevant to her dx specifically related to skin care.  Baseline:  No knowledge Eval Achieved at eval  2 Pt will be able to return demo and/or verbalize understanding of the post op HEP related to regaining shoulder ROM. Baseline:  No knowledge Eval Achieved at eval  3 Pt will be able to verbalize understanding of the importance of viewing the post op After Breast CA Class video for further lymphedema risk reduction education and therapeutic exercise.  Baseline:  No knowledge Eval Achieved  at eval   LONG TERM GOALS:  (STG=LTG)  GOALS Name Target Date  Goal status  1 Pt will demonstrate she has regained full shoulder ROM and function post operatively compared to baselines.  Baseline: *** INITIAL  2  *** INITIAL  3  *** INITIAL  4  *** INITIAL     PLAN:  PT FREQUENCY/DURATION: ***  PLAN FOR NEXT SESSION: ***   [facility]  [address]  [place] [address]  [phone]  After Breast Cancer Class Video It is recommended you view the ABC class video to be educated on lymphedema risk reduction. This video lasts for about 30 minutes. It can be viewed on our website here: [email or web address]  Scar massage You can begin gentle scar massage to you incision sites. Gently place one hand on the incision and move the skin (without sliding on the skin) in various directions. Do this for a few minutes and then you can gently massage either coconut oil or vitamin E cream into the scars.  Compression garment You should continue wearing your  compression bra until you feel like you no longer have swelling.  Home exercise Program Continue doing the exercises you were given until you feel like you can do them without feeling any tightness at the end.   Walking Program Studies show that 30 minutes of walking per day (fast enough to elevate your heart rate) can significantly reduce the risk of a cancer recurrence. If you can't walk due to other medical reasons, we encourage you to find another activity you could do (like a stationary bike or water  exercise).  Posture After breast cancer surgery, people frequently sit with rounded shoulders posture because it puts their incisions on slack and feels better. If you sit like this and scar tissue forms in that position, you can become very tight and have pain sitting or standing with good posture. Try to be aware of your posture and sit and stand up tall to heal properly.  Follow up PT: It is recommended you return every 3 months for the first 3 years following surgery to be assessed on the SOZO machine for an L-Dex score. This helps prevent clinically significant lymphedema in 95% of patients. These follow up screens are 10 minute appointments that you are not billed for.  Larue Saddie SAUNDERS, PT 07/13/2024, 10:18 PM  "

## 2024-07-14 ENCOUNTER — Ambulatory Visit: Admitting: Rehabilitation

## 2024-07-14 ENCOUNTER — Ambulatory Visit (INDEPENDENT_AMBULATORY_CARE_PROVIDER_SITE_OTHER)

## 2024-07-14 ENCOUNTER — Encounter: Payer: Self-pay | Admitting: *Deleted

## 2024-07-14 VITALS — BP 117/75 | HR 62

## 2024-07-14 DIAGNOSIS — Z09 Encounter for follow-up examination after completed treatment for conditions other than malignant neoplasm: Secondary | ICD-10-CM

## 2024-07-14 DIAGNOSIS — D0512 Intraductal carcinoma in situ of left breast: Secondary | ICD-10-CM

## 2024-07-14 DIAGNOSIS — Z9889 Other specified postprocedural states: Secondary | ICD-10-CM

## 2024-07-14 NOTE — Progress Notes (Signed)
" ° °  Established Patient Office Visit  Subjective   Patient ID: Tara Yates, female    DOB: [birth year]  Age: 59 y.o. MRN: [id number]  Chief Complaint  Patient presents with   post op    HPI  59 year old female who recently underwent bilateral mastectomies with bilateral breast reconstruction with insertion of tissue expanders with Dr. Lemond Griffee on 06/23/2024. Intraoperatively, patient had 475 cc expanders placed. Patient is 2 weeks postop. She presents to the clinic today for postoperative follow-up     Objective:     BP 117/75 (BP Location: Left Arm, Patient Position: Sitting, Cuff Size: Large)   Pulse 62   LMP 07/07/2018   SpO2 97%  BP Readings from Last 3 Encounters:  07/14/24 117/75  07/08/24 128/78  07/04/24 119/77    Physical Exam Chaperone present on exam. On exam, patient sitting upright in no acute distress. Expanders are in place bilaterally. There is a superficial abrasion to the left superior/lateral breast with resolving congestion. There are otherwise no overlying skin changes. There is no overlying erythema. Mild amount of fluid after drain removal bilaterally on exam. Steri-Strips are in place over the incisions bilaterally. On the right medial incision steri strip a bit humid with underlying skin maceration. Removed, area clean with Vashe and replaced.  JP drains sites well healed. There are no signs of infection on exam.   Last CBC Lab Results  Component Value Date   WBC 4.7 06/27/2024   HGB 11.5 (L) 06/27/2024   HCT 34.0 (L) 06/27/2024   MCV 94.4 06/27/2024   MCH 31.9 06/27/2024   RDW 12.5 06/27/2024   PLT 199 06/27/2024   Last metabolic panel Lab Results  Component Value Date   GLUCOSE 119 (H) 06/27/2024   NA 142 06/27/2024   K 4.5 06/27/2024   CL 104 06/27/2024   CO2 27 06/27/2024   BUN 13 06/27/2024   CREATININE 0.61 06/27/2024   GFRNONAA >60 06/27/2024   CALCIUM 9.4 06/27/2024   PROT 6.9 01/29/2024   ALBUMIN  4.4 09/24/2023   LABGLOB 2.7  05/15/2020   AGRATIO 1.8 05/15/2020   BILITOT 0.5 01/29/2024   ALKPHOS 41 09/24/2023   AST 23 01/29/2024   ALT 35 (H) 01/29/2024   ANIONGAP 11 06/27/2024    The 10-year ASCVD risk score (Arnett DK, et al., 2019) is: 1.7%    Assessment & Plan:   Problem List Items Addressed This Visit   None Visit Diagnoses       Follow-up exam    -  Primary     S/P breast reconstruction          Continue no shower protocol until wounds completely resolved. Can sponge bath. Ok for light bra pressure.  Continue nitropaste one more week on left breast. We will hold of on expansion today. May start next week depending on how everything looks.  Patient will follow up with me next Wednesday or earlier if any issues.  All questions answered to patient satisfaction.    Phillipa Morden M Qasim Diveley, MD  "

## 2024-07-15 ENCOUNTER — Encounter

## 2024-07-15 ENCOUNTER — Encounter: Admitting: Student

## 2024-07-19 ENCOUNTER — Inpatient Hospital Stay: Admitting: Hematology

## 2024-07-20 ENCOUNTER — Ambulatory Visit

## 2024-07-20 VITALS — BP 118/76 | HR 74

## 2024-07-20 DIAGNOSIS — Z09 Encounter for follow-up examination after completed treatment for conditions other than malignant neoplasm: Secondary | ICD-10-CM

## 2024-07-20 DIAGNOSIS — Z9889 Other specified postprocedural states: Secondary | ICD-10-CM

## 2024-07-20 NOTE — Progress Notes (Signed)
" ° °  Established Patient Office Visit  Subjective   Patient ID: Tara Yates, female    DOB: 11/06/1965  Age: 59 y.o. MRN: [id number]  Chief Complaint  Patient presents with   post op    HPI  59 year old female who recently underwent bilateral mastectomies with bilateral breast reconstruction with insertion of tissue expanders with Dr. Kariss Longmire on 06/23/2024. Intraoperatively, patient had 475 cc expanders placed. Patient is 3 weeks postop. She presents to the clinic today for postoperative follow-up.   Objective:     BP 118/76 (BP Location: Left Arm, Patient Position: Sitting, Cuff Size: Normal)   Pulse 74   LMP 07/07/2018   SpO2 96%  BP Readings from Last 3 Encounters:  07/20/24 118/76  07/14/24 117/75  07/08/24 128/78    Physical Exam RN as chaperone Bilateral breasts Steri-Strips still in place.  There is an area on the left medial incision with what looks like a rash from the Dermabond that is resolving.  No signs of infection.  Mild amount of fluid bilaterally. Superficial abrasion to the left superior/lateral breast has resolved and reepithelialized.  That area of the skin of the breast on the radiated side is still mildly congested. We placed injectable saline in the Expander using a sterile technique: Right: 50 cc for a total of 50 cc Left: 50 cc for a total of 50 cc  Last CBC Lab Results  Component Value Date   WBC 4.7 06/27/2024   HGB 11.5 (L) 06/27/2024   HCT 34.0 (L) 06/27/2024   MCV 94.4 06/27/2024   MCH 31.9 06/27/2024   RDW 12.5 06/27/2024   PLT 199 06/27/2024   Last metabolic panel Lab Results  Component Value Date   GLUCOSE 119 (H) 06/27/2024   NA 142 06/27/2024   K 4.5 06/27/2024   CL 104 06/27/2024   CO2 27 06/27/2024   BUN 13 06/27/2024   CREATININE 0.61 06/27/2024   GFRNONAA >60 06/27/2024   CALCIUM 9.4 06/27/2024   PROT 6.9 01/29/2024   ALBUMIN  4.4 09/24/2023   LABGLOB 2.7 05/15/2020   AGRATIO 1.8 05/15/2020   BILITOT 0.5 01/29/2024    ALKPHOS 41 09/24/2023   AST 23 01/29/2024   ALT 35 (H) 01/29/2024   ANIONGAP 11 06/27/2024    The 10-year ASCVD risk score (Arnett DK, et al., 2019) is: 1.8%    Assessment & Plan:   Problem List Items Addressed This Visit   None Visit Diagnoses       Follow-up exam    -  Primary     S/P breast reconstruction          Continue no shower protocol. Can sponge bath. Ok for light bra pressure.  Continue nitropaste one more week on left breast. Tolerated today's expansion well. Patient will follow up with me next week.for further expansion  All questions answered to patient satisfaction.   Saran Laviolette M Eletha Culbertson, MD  "

## 2024-07-25 ENCOUNTER — Ambulatory Visit

## 2024-07-25 VITALS — BP 122/76 | HR 74

## 2024-07-25 DIAGNOSIS — C50411 Malignant neoplasm of upper-outer quadrant of right female breast: Secondary | ICD-10-CM

## 2024-07-25 DIAGNOSIS — Z9889 Other specified postprocedural states: Secondary | ICD-10-CM

## 2024-07-25 DIAGNOSIS — Z09 Encounter for follow-up examination after completed treatment for conditions other than malignant neoplasm: Secondary | ICD-10-CM

## 2024-07-25 DIAGNOSIS — Z923 Personal history of irradiation: Secondary | ICD-10-CM

## 2024-07-25 MED ORDER — AMOXICILLIN-POT CLAVULANATE 875-125 MG PO TABS: 1.0000 | ORAL_TABLET | Freq: Two times a day (BID) | ORAL | 0 refills | Status: AC

## 2024-07-25 NOTE — Progress Notes (Signed)
" ° °  Established Patient Office Visit  Subjective   Patient ID: Tara Yates, female    DOB: 1966/02/15  Age: 59 y.o. MRN: [id number]  Chief Complaint  Patient presents with   Post-op Follow-up    HPI 59 year old female who recently underwent bilateral mastectomies with bilateral breast reconstruction with insertion of tissue expanders with Dr. Kattaleya Alia on 06/23/2024. Intraoperatively, patient had 475 cc expanders placed. Patient is 4 weeks postop. She presents to the clinic today for postoperative follow-up. She has a small opening on the left side at the T-point and another small opening at medial IMF incision on the right. Otherwise no issues reported.    Objective:     BP 122/76 (BP Location: Left Arm, Patient Position: Sitting, Cuff Size: Normal)   Pulse 74   LMP 07/07/2018   SpO2 95%  BP Readings from Last 3 Encounters:  07/25/24 122/76  07/20/24 118/76  07/14/24 117/75   Bilateral breasts Steri-Strips still in place. No signs of infection.  Mild amount of fluid bilaterally. Superficial abrasion to the left superior/lateral breast has resolved and reepithelialized.  That area of the skin of the breast on the radiated side is still mildly congested.  She has a small opening on the left side at the T-point and another small opening at medial IMF incision on the right. Under sterile conditions the openings were cauterized with silver nitrate and closed with a 4-0 Nylon bilaterally.   The 10-year ASCVD risk score (Arnett DK, et al., 2019) is: 1.9%    Assessment & Plan:   Problem List Items Addressed This Visit       Other   Malignant neoplasm of upper-outer quadrant of right female breast (HCC)   Relevant Medications   amoxicillin -clavulanate (AUGMENTIN ) 875-125 MG tablet   Other Visit Diagnoses       S/P breast reconstruction    -  Primary     Follow-up exam         History of radiation therapy          Patient presented today with 2 small open areas on at the T  point on the left and 1 on the medial IMF on the right.  No expander or mesh exposed.  I decided to hold off on expansion today.  These areas were cleaned with Vashe and cauterized/debrided with silver nitrate and a few 4 nylon stitches were placed to approximate the skin and allow for healing.  Patient will be placed on antibiotics prophylactically.  I explained to the patient that the goal is to try to save the reconstructions.  Instructed to monitor for signs and symptoms of infection, including fevers chills redness or swelling of the breasts.  Patient will follow-up with me closely, next follow-up appointment will be on Wednesday or earlier if any questions or concerns.  All questions answered to patient satisfaction.  Tara Engelson M Rhodia Acres, MD  "

## 2024-07-25 NOTE — Progress Notes (Unsigned)
 "     [facility]   [phone] Fax:([phone]    Patient Care Team: [name], MD as PCP - General (Family Medicine) [name], RN as Oncology Nurse Navigator [name], MD as Consulting Physician (General Surgery) [name], MD as Consulting Physician (Hematology) [name], MD as Consulting Physician (Radiation Oncology) [name], NP as Nurse Practitioner (Nurse Practitioner) [name], RN as Oncology Nurse Navigator   CHIEF COMPLAINT: Postop follow-up right breast cancer and history of left breast DCIS  Oncology History  Ductal carcinoma in situ of left breast  10/16/2020 Pathology Results   Breast, left, needle core biopsy, UOQ posterior - DUCTAL CARCINOMA IN SITU WITH CALCIFICATIONS, INTERMEDIATE NUCLEAR GRADE  PROGNOSTIC INDICATORS Results: IMMUNOHISTOCHEMICAL AND MORPHOMETRIC ANALYSIS PERFORMED MANUALLY Estrogen Receptor: 95%, POSITIVE, STRONG STAINING INTENSITY Progesterone Receptor: 20%, POSITIVE, STRONG STAINING INTENSITY   11/09/2020 Initial Diagnosis   Ductal carcinoma in situ of left breast   11/16/2020 Genetic Testing   Negative hereditary cancer genetic testing: no pathogenic variants detected in [employer] BRCAPlus Panel.  The report date is Nov 16, 2020. The BRCAplus panel offered by [employer] and includes sequencing and deletion/duplication analysis for the following 8 genes: ATM, BRCA1, BRCA2, CDH1, CHEK2, PALB2, PTEN, and TP53.    Negative hereditary cancer genetic testing: no pathogenic variants detected in [employer] CustomNext-Cancer +RNAinsight Panel.  Variant of uncertain significance detected in CDKN2A at c.440C>T (p.A147V). The report date is Nov 26, 2020.   The CustomNext-Cancer+RNAinsight panel offered by [employer] includes sequencing and rearrangement analysis for the following 47 genes:  APC, ATM, AXIN2, BARD1, BMPR1A, BRCA1, BRCA2, BRIP1, CDH1, CDK4, CDKN2A, CHEK2, DICER1, EPCAM,  GREM1, HOXB13, MEN1, MLH1, MSH2, MSH3, MSH6, MUTYH, NBN, NF1, NF2, NTHL1, PALB2, PMS2, POLD1, POLE, PTEN, RAD51C, RAD51D, RECQL, RET, SDHA, SDHAF2, SDHB, SDHC, SDHD, SMAD4, SMARCA4, STK11, TP53, TSC1, TSC2, and VHL.  RNA data is routinely analyzed for use in variant interpretation for all genes.    12/18/2020 Surgery   Left Lumpectomy  FINAL MICROSCOPIC DIAGNOSIS:   A. BREAST, LEFT, LUMPECTOMY:  - Ductal carcinoma in situ, intermediate grade, involving areas of  sclerosing adenosis, see comment  - Resection margins are negative for DCIS; closest is the posterior  margin at 3 mm  - Biopsy site changes  - Calcifications  - See oncology table   B. BREAST, LEFT ADDITIONAL LATERAL MARGIN, EXCISION:  - Fibrocystic change with sclerosing adenosis and calcifications  - Negative for carcinoma   C. BREAST, LEFT ADDITIONAL POSTERIOR MARGIN, EXCISION:  - Benign breast parenchyma, negative for carcinoma    12/18/2020 Cancer Staging   Staging form: Breast, AJCC 8th Edition - Pathologic stage from 12/18/2020: Stage Unknown (pTis (DCIS), pNX, cM0, G2, ER+, PR+, HER2: Not Assessed) - Signed by [name], NP on 06/27/2021 Stage prefix: Initial diagnosis Histologic grading system: 3 grade system   06/27/2021 Survivorship   SCP delivered by [name], NP   07/2021 -  Anti-estrogen oral therapy   Pending start of chemoprevention with tamoxifen  in 07/2021    12/19/2021 Imaging   IMPRESSION: Breast MRI impression:  1. Area of non mass enhancement in the left breast has increased in size from the prior MRI. It lies just below cylinder shaped biopsy clip. Given the position of the post biopsy marker clip and the interval increase in size of this enhancement, re-biopsy is recommended. 2. No other areas of abnormal enhancement in the left breast. 3. No evidence of right breast  malignancy.   RECOMMENDATION: 1. MRI guided core needle biopsy of the area of non mass enhancement in the left breast.    12/27/2021 Pathology Results   Diagnosis Breast, left, needle core biopsy, upper inner quadrant, cylinder clip - LOBULAR NEOPLASIA (ATYPICAL LOBULAR HYPERPLASIA). - FIBROCYSTIC CHANGES WITH USUAL DUCTAL HYPERPLASIA AND CALCIFICATIONS. - SEE NOTE.   02/06/2022 Surgery   Left breast lumpectomy by Dr. [name]   02/06/2022 Pathology Results   FINAL MICROSCOPIC DIAGNOSIS: A. BREAST, LEFT, LUMPECTOMY: Ductal carcinoma in-situ, apocrine type with clear margins of resection. Radial sclerosing lesion with extensive atypical lobular hyperplasia in the immediate vicinity and elsewhere in the breast. Multiple foci of sclerosing adenosis. Invasive carcinoma is not identified.  Estrogen receptors and progesterone receptors both negative   03/18/2022 -  Radiation Therapy   Adjuvant radiation by Dr. Dewey, plan to complete 04/15/2022   Malignant neoplasm of upper-outer quadrant of right female breast (HCC)  04/08/2024 Initial Diagnosis   Malignant neoplasm of upper-outer quadrant of right female breast (HCC)   04/21/2024 Cancer Staging   Staging form: Breast, AJCC 8th Edition - Clinical stage from 04/21/2024: Stage IA (cT1c, cN0, cM0, G2, ER+, PR-, HER2-) - Signed by [name], PA-C on 04/21/2024 Stage prefix: Initial diagnosis Method of lymph node assessment: Clinical Histologic grading system: 3 grade system      CURRENT THERAPY: S/p bilateral mastectomy and reconstruction, antiestrogen therapy with anastrozole  starting 04/2024 (switched from tamoxifen  previously)  INTERVAL HISTORY Tara Yates returns for postop follow-up, last seen by Dr. Bronson 04/14/2024.  In the interim she had Oncotype testing on the breast biopsy which showed RS of 20, no chemo indicated.  She switched tamoxifen  to anastrozole  due to the invasive nature of the new breast cancer.  She underwent bilateral mastectomy and reconstruction 06/23/2024 by Dr. Bury and Dr. Montorfano.  Final path showed no  invasive carcinoma, negative lymph nodes, clear margins, and no lymphatic or vascular invasion.  She has been followed closely by plastics for small openings at the right and left reconstructed breasts.  Most recently she had bilateral seromas drained yesterday.  ROS   Past Medical History:  Diagnosis Date   Allergy    Anxiety    Back pain    Breast cancer (HCC)    Constipation    Depression    Ductal carcinoma in situ of left breast 11/09/2020   Family history of breast cancer 11/09/2020   HSV (herpes simplex virus) anogenital infection    Hyperlipidemia    Hypothyroidism    Joint pain    Plantar fasciitis, bilateral    SOBOE (shortness of breath on exertion)      Past Surgical History:  Procedure Laterality Date   ADJACENT TISSUE TRANSFER/TISSUE REARRANGEMENT Bilateral 06/23/2024   Procedure: ADJACENT TISSUE TRANSFER WITH MESH PLACEMENT;  Surgeon: Montorfano, Lisandro M, MD;  Location: [facility];  Service: Plastics;  Laterality: Bilateral;   AXILLARY SENTINEL NODE BIOPSY Right 06/23/2024   Procedure: RIGHT AXILLARY SENTINEL LYMPH NODE BIOPSY;  Surgeon: Bury Cough, MD;  Location: [facility];  Service: General;  Laterality: Right;  RIGHT AXILLARY SENTINEL NODE BIOPSY   BREAST BIOPSY Right 02/28/2015   BREAST BIOPSY Left 07/11/2021   BREAST EXCISIONAL BIOPSY Right 03/29/2015   had seed placement as wel   BREAST LUMPECTOMY WITH RADIOACTIVE SEED LOCALIZATION Left 12/18/2020   Procedure: LEFT BREAST LUMPECTOMY WITH RADIOACTIVE SEED LOCALIZATION;  Surgeon: Bury Cough, MD;  Location: [facility];  Service: General;  Laterality: Left;   CARPAL TUNNEL RELEASE Right 2022   ENDOMETRIAL ABLATION     MOUTH SURGERY     RADIOACTIVE SEED GUIDED EXCISIONAL BREAST BIOPSY Right 04/03/2015   Procedure: RADIOACTIVE SEED GUIDED EXCISIONAL BREAST BIOPSY;  Surgeon: Donnice Bury, MD;  Location: [facility];  Service:  General;  Laterality: Right;   RADIOACTIVE SEED GUIDED EXCISIONAL BREAST BIOPSY Left 02/06/2022   Procedure: LEFT BREAST SEED GUIDED EXCISIONAL BIOPSY;  Surgeon: Bury Donnice, MD;  Location: [facility];  Service: General;  Laterality: Left;  LMA   TISSUE EXPANDER PLACEMENT Bilateral 06/23/2024   Procedure: BILATERAL TISSUE EXPANDER PLACEMENT;  Surgeon: Montorfano, Lisandro M, MD;  Location: [facility];  Service: Plastics;  Laterality: Bilateral;   TOTAL MASTECTOMY Bilateral 06/23/2024   Procedure: BILATERAL MASTECTOMY, SIMPLE;  Surgeon: Bury Donnice, MD;  Location:  SURGERY CENTER;  Service: General;  Laterality: Bilateral;  GEN w/ PEC BLOCK RIGHT MASTECTOMY LEFT RISK REDUCING MASTECTOMY     Outpatient Encounter Medications as of 07/28/2024  Medication Sig   acetaminophen  (TYLENOL ) 500 MG tablet Take 1 tablet (500 mg total) by mouth every 6 (six) hours as needed.   amoxicillin -clavulanate (AUGMENTIN ) 875-125 MG tablet Take 1 tablet by mouth 2 (two) times daily for 14 days.   anastrozole  (ARIMIDEX ) 1 MG tablet TAKE 1 TABLET BY MOUTH EVERY DAY   bacitracin  ointment Apply 1 Application topically 2 (two) times daily.   BIOTIN MAXIMUM PO Take by mouth.   Calcium Carb-Cholecalciferol (CALCIUM 600 + D PO) Take by mouth.   cholecalciferol (VITAMIN D3) 25 MCG (1000 UNIT) tablet Take 1,000 Units by mouth daily.   cyclobenzaprine  (FLEXERIL ) 5 MG tablet Take 1 tablet (5 mg total) by mouth 3 (three) times daily as needed for muscle spasms.   gabapentin  (NEURONTIN ) 300 MG capsule Take 1 capsule (300 mg total) by mouth 2 (two) times daily. (Patient not taking: Reported on 07/25/2024)   ibuprofen  (ADVIL ) 600 MG tablet Take 1 tablet (600 mg total) by mouth every 8 (eight) hours as needed.   LAVENDER OIL PO Take by mouth.   levothyroxine  (SYNTHROID ) 25 MCG tablet TAKE 1 TABLET BY MOUTH EVERY DAY BEFORE BREAKFAST   magnesium gluconate (MAGONATE) 500 (27 Mg) MG TABS tablet Take 500  mg by mouth in the morning and at bedtime.   Menaquinone-7 (VITAMIN K2 PO) Take by mouth.   Multiple Vitamin (MULTI-VITAMIN) tablet Take by mouth.   nitroGLYCERIN  (NITROGLYN) 2 % ointment Apply 0.5 inches topically in the morning and at bedtime.   Omega-3 Fatty Acids (OMEGA 3 FISH OIL PO) Take by mouth.   ondansetron  (ZOFRAN -ODT) 4 MG disintegrating tablet Take 1 tablet (4 mg total) by mouth every 8 (eight) hours as needed for nausea or vomiting.   oxyCODONE  (ROXICODONE ) 5 MG immediate release tablet Take 1 tablet (5 mg total) by mouth every 4 (four) hours as needed for severe pain (pain score 7-10).   Probiotic Product (ALIGN PO) Take by mouth.   No facility-administered encounter medications on file as of 07/28/2024.     There were no vitals filed for this visit. There is no height or weight on file to calculate BMI.   ECOG PERFORMANCE STATUS: {CHL ONC ECOG [phone]}  PHYSICAL EXAM GENERAL:alert, no distress and comfortable SKIN: no rash  EYES: sclera clear NECK: without mass LYMPH:  no palpable cervical or supraclavicular lymphadenopathy  LUNGS: clear with normal breathing effort HEART: regular rate & rhythm, no lower extremity edema ABDOMEN: abdomen soft, non-tender and  normal bowel sounds NEURO: alert & oriented x 3 with fluent speech, no focal motor/sensory deficits Breast exam:  PAC without erythema    CBC    Latest Ref Rng & Units 06/27/2024    7:01 PM 06/24/2024    5:00 AM 01/29/2024    2:28 PM  CBC  WBC 4.0 - 10.5 K/uL 4.7  5.7  4.6   Hemoglobin 12.0 - 15.0 g/dL 88.4  9.5  86.6   Hematocrit 36.0 - 46.0 % 34.0  28.4  40.5   Platelets 150 - 400 K/uL 199  154  201       CMP     Latest Ref Rng & Units 06/27/2024    7:01 PM 06/24/2024    5:00 AM 01/29/2024    2:28 PM  CMP  Glucose 70 - 99 mg/dL 880  875  87   BUN 6 - 20 mg/dL 13  8  12    Creatinine 0.44 - 1.00 mg/dL 9.38  9.31  9.38   Sodium 135 - 145 mmol/L 142  142  139   Potassium 3.5 - 5.1 mmol/L 4.5   3.7  4.0   Chloride 98 - 111 mmol/L 104  107  102   CO2 22 - 32 mmol/L 27  27  27    Calcium 8.9 - 10.3 mg/dL 9.4  8.3  9.4   Total Protein 6.1 - 8.1 g/dL   6.9   Total Bilirubin 0.2 - 1.2 mg/dL   0.5   AST 10 - 35 U/L   23   ALT 6 - 29 U/L   35       ASSESSMENT & PLAN:  PLAN:  No orders of the defined types were placed in this encounter.     All questions were answered. The patient knows to call the clinic with any problems, questions or concerns. No barriers to learning were detected. I spent *** counseling the patient face to face. The total time spent in the appointment was *** and more than 50% was on counseling, review of test results, and coordination of care.   Wayne Wicklund K Anaiz Qazi, NP 07/25/2024 4:34 PM  "

## 2024-07-27 ENCOUNTER — Ambulatory Visit

## 2024-07-27 VITALS — BP 122/76 | HR 72

## 2024-07-27 DIAGNOSIS — C50411 Malignant neoplasm of upper-outer quadrant of right female breast: Secondary | ICD-10-CM

## 2024-07-27 DIAGNOSIS — Z923 Personal history of irradiation: Secondary | ICD-10-CM

## 2024-07-27 DIAGNOSIS — Z09 Encounter for follow-up examination after completed treatment for conditions other than malignant neoplasm: Secondary | ICD-10-CM

## 2024-07-27 DIAGNOSIS — L7634 Postprocedural seroma of skin and subcutaneous tissue following other procedure: Secondary | ICD-10-CM

## 2024-07-27 DIAGNOSIS — Z9889 Other specified postprocedural states: Secondary | ICD-10-CM

## 2024-07-27 NOTE — Progress Notes (Addendum)
" ° °  Established Patient Office Visit  Subjective   Patient ID: Tara Yates, female    DOB: [birth year]  Age: 59 y.o. MRN: [id number]  Chief Complaint  Patient presents with   Post-op Follow-up    HPI  59 year old female who recently underwent bilateral mastectomies with bilateral breast reconstruction with insertion of tissue expanders with Dr. Zoye Chandra on 06/23/2024. Intraoperatively, patient had 475 cc expanders placed. Patient is 4 weeks postop. She presents to the clinic today for postoperative follow-up. She has a small opening on the left side at the T-point and another small opening at medial IMF incision on the right. Otherwise no issues reported.     Objective:     BP 122/76 (BP Location: Left Arm, Patient Position: Sitting, Cuff Size: Normal)   Pulse 72   LMP 07/07/2018   SpO2 99%  BP Readings from Last 3 Encounters:  07/27/24 122/76  07/25/24 122/76  07/20/24 118/76    Physical Exam MA as chaperone Bilateral breasts Steri-Strips still in place. No signs of infection.  I have noticed moderate of fluid bilaterally, not improving. Superficial abrasion to the left superior/lateral breast has resolved and reepithelialized. New steri strips on incisions in place.   Procedure: Sterile aspiration of bilateral breast seromas  Indication: Postoperative seroma formation following bilateral tissue expander placement.  Description: Verbal consent was obtained. The patient was positioned supine and the bilateral breast areas were exposed. The bilateral breasts were prepped in standard sterile fashion using antiseptic solution and allowed to dry. Sterile gloves were donned, and a sterile field was maintained throughout the procedure.  Ultrasound was performed and confirmed bilateral seromas.   Using sterile technique, a needle and syringe were introduced into the seroma cavity of the right breast, and 60 cc of clear serous fluid was aspirated without difficulty. The needle was  withdrawn, and hemostasis was achieved with gentle pressure.  Attention was then turned to the left breast, where a separate sterile needle and syringe were used. 120 cc of clear serous fluid was aspirated in a similar sterile manner. The needle was removed, and hemostasis was again achieved.  The patient tolerated the procedure well. No immediate complications were noted. Sterile dressings were applied to both aspiration sites.   Assessment & Plan:   Problem List Items Addressed This Visit       Other   Malignant neoplasm of upper-outer quadrant of right female breast [facility])   Other Visit Diagnoses       S/P breast reconstruction    -  Primary     Follow-up exam         History of radiation therapy           Bilateral seromas drained today without complication. Instructed to monitor for signs and symptoms of infection, including fevers chills redness or swelling of the breasts.  Patient will follow-up with me closely, next follow-up appointment will be on Friday or earlier if any questions or concerns.  All questions answered to patient satisfaction.    Eann Cleland M Sharaine Delange, MD  "

## 2024-07-28 ENCOUNTER — Inpatient Hospital Stay: Admitting: Nurse Practitioner

## 2024-07-29 ENCOUNTER — Encounter (INDEPENDENT_AMBULATORY_CARE_PROVIDER_SITE_OTHER): Payer: Self-pay

## 2024-07-29 ENCOUNTER — Ambulatory Visit

## 2024-07-29 VITALS — BP 120/68 | HR 77

## 2024-07-29 DIAGNOSIS — Z09 Encounter for follow-up examination after completed treatment for conditions other than malignant neoplasm: Secondary | ICD-10-CM

## 2024-07-29 DIAGNOSIS — Z9889 Other specified postprocedural states: Secondary | ICD-10-CM

## 2024-07-29 DIAGNOSIS — Z923 Personal history of irradiation: Secondary | ICD-10-CM

## 2024-07-29 NOTE — Progress Notes (Signed)
" ° °  Established Patient Office Visit  Subjective   Patient ID: Tara Yates, female    DOB: [birth year]  Age: 59 y.o. MRN: [id number]  Chief Complaint  Patient presents with   post op    HPI 59 year old female who recently underwent bilateral mastectomies with bilateral breast reconstruction with insertion of tissue expanders on 06/23/2024. Intraoperatively, patient had 475 cc expanders placed. Patient is 5 weeks postop. She presents to the clinic today for postoperative follow-up. She had a small opening on the left side at the T-point that seem to have healed and another small opening at medial IMF incision on the right that was treated with silver nitrate and it does not seem to be communicating with a tissue expander. Otherwise no issues reported.  Pain has been doing somewhat controlled.  Reports no fevers chills erythema or any other signs or symptoms of infection.    Objective:     BP 120/68 (BP Location: Left Arm, Patient Position: Sitting, Cuff Size: Normal)   Pulse 77   LMP 07/07/2018   SpO2 100%  BP Readings from Last 3 Encounters:  07/29/24 120/68  07/27/24 122/76  07/25/24 122/76    Physical Exam Bilateral breasts Steri-Strips still in place. No signs of infection.  Superficial abrasion to the left superior/lateral breast has resolved and reepithelialized.  I removed the Steri-Strips on the medial IMF of the right breast clean the necrotic tissue from silver nitrate treatment the wound edges look clean.  Under sterile technique two 4-0 nylon stitches were placed to approximate the wound leaving a gap between stitches to allow for drainage.  The T point breakdown on the left chest has improved seems to be healing. New Steri-Strips applied on the incisions.  No recurrence of seroma on the left side, mild amount of fluid on the right side.  No signs of infection.    The 10-year ASCVD risk score (Arnett DK, et al., 2019) is: 1.8%    Assessment & Plan:   Problem List  Items Addressed This Visit   None Visit Diagnoses       S/P breast reconstruction    -  Primary     Follow-up exam         History of radiation therapy          We will hold off on expansion until the wounds are completely healed.  Will hold off on physical therapy until the wounds are completely healed.  Continue antibiotic therapy for now.  Instructed to monitor for signs and symptoms of infection, including fevers chills redness or swelling of the breasts.  Patient will follow-up with me closely, next follow-up appointment will be on Thursday or earlier if any questions or concerns.  All questions answered to patient satisfaction.    Malahki Gasaway M Sadik Piascik, MD  "

## 2024-08-01 ENCOUNTER — Telehealth: Payer: Self-pay | Admitting: Nurse Practitioner

## 2024-08-01 NOTE — Telephone Encounter (Signed)
 Rescheduled appointment due to cancer center not opening up until 10am on Tuesday 1/27.The patients appointment was changed to a telephone visit. Called and left VM with the appointment changes for the patient.

## 2024-08-02 ENCOUNTER — Inpatient Hospital Stay: Attending: Hematology | Admitting: Nurse Practitioner

## 2024-08-02 ENCOUNTER — Encounter: Payer: Self-pay | Admitting: Nurse Practitioner

## 2024-08-02 DIAGNOSIS — Z86000 Personal history of in-situ neoplasm of breast: Secondary | ICD-10-CM | POA: Diagnosis not present

## 2024-08-02 DIAGNOSIS — Z1721 Progesterone receptor positive status: Secondary | ICD-10-CM | POA: Diagnosis not present

## 2024-08-02 DIAGNOSIS — Z170 Estrogen receptor positive status [ER+]: Secondary | ICD-10-CM

## 2024-08-02 DIAGNOSIS — Z1722 Progesterone receptor negative status: Secondary | ICD-10-CM | POA: Diagnosis not present

## 2024-08-02 DIAGNOSIS — C50411 Malignant neoplasm of upper-outer quadrant of right female breast: Secondary | ICD-10-CM | POA: Diagnosis not present

## 2024-08-02 DIAGNOSIS — D0512 Intraductal carcinoma in situ of left breast: Secondary | ICD-10-CM

## 2024-08-02 DIAGNOSIS — Z1732 Human epidermal growth factor receptor 2 negative status: Secondary | ICD-10-CM | POA: Diagnosis not present

## 2024-08-02 NOTE — Progress Notes (Signed)
 "     [facility]   Telephone:([phone]) [phone] Fax:([phone]) [phone]    Patient Care Team: [name], MD as PCP - General (Family Medicine) [name], RN as Oncology Nurse Navigator [name], MD as Consulting Physician (General Surgery) [name], MD as Consulting Physician (Hematology) [name], MD as Consulting Physician (Radiation Oncology) Caz Weaver K, NP as Nurse Practitioner (Nurse Practitioner) [name], RN as Oncology Nurse Navigator  Date of service: 08/02/2024   I connected with Tara Yates on 08/02/24 at 11:00 AM EST by telephone visit and verified that I am speaking with the correct person using two identifiers.   I discussed the limitations, risks, security and privacy concerns of performing an evaluation and management service by telemedicine and the availability of in-person appointments. I also discussed with the patient that there may be a patient responsible charge related to this service. The patient expressed understanding and agreed to proceed.   Other persons participating in the visit and their role in the encounter: N/A   Patients location: Home  Providers location: [facility] office    CHIEF COMPLAINT: Postop follow-up right breast cancer and history of left breast DCIS  Oncology History  Ductal carcinoma in situ of left breast  10/16/2020 Pathology Results   Breast, left, needle core biopsy, UOQ posterior - DUCTAL CARCINOMA IN SITU WITH CALCIFICATIONS, INTERMEDIATE NUCLEAR GRADE  PROGNOSTIC INDICATORS Results: IMMUNOHISTOCHEMICAL AND MORPHOMETRIC ANALYSIS PERFORMED MANUALLY Estrogen Receptor: 95%, POSITIVE, STRONG STAINING INTENSITY Progesterone Receptor: 20%, POSITIVE, STRONG STAINING INTENSITY   11/09/2020 Initial Diagnosis   Ductal carcinoma in situ of left breast   11/16/2020 Genetic Testing   Negative hereditary cancer genetic testing: no pathogenic variants detected in [facility] BRCAPl[facility] Panel.  The  report date is Nov 16, 2020. The BRCAplus panel offered by [employer] and includes sequencing and deletion/duplication analysis for the following 8 genes: ATM, BRCA1, BRCA2, CDH1, CHEK2, PALB2, PTEN, and TP53.    Negative hereditary cancer genetic testing: no pathogenic variants detected in [employer] CustomNext-Cancer +RNAinsight Panel.  Variant of uncertain significance detected in CDKN2A at c.440C>T (p.A147V). The report date is Nov 26, 2020.   The CustomNext-Cancer+RNAinsight panel offered by [employer] includes sequencing and rearrangement analysis for the following 47 genes:  APC, ATM, AXIN2, BARD1, BMPR1A, BRCA1, BRCA2, BRIP1, CDH1, CDK4, CDKN2A, CHEK2, DICER1, EPCAM, GREM1, HOXB13, MEN1, MLH1, MSH2, MSH3, MSH6, MUTYH, NBN, NF1, NF2, NTHL1, PALB2, PMS2, POLD1, POLE, PTEN, RAD51C, RAD51D, RECQL, RET, SDHA, SDHAF2, SDHB, SDHC, SDHD, SMAD4, SMARCA4, STK11, TP53, TSC1, TSC2, and VHL.  RNA data is routinely analyzed for use in variant interpretation for all genes.    12/18/2020 Surgery   Left Lumpectomy  FINAL MICROSCOPIC DIAGNOSIS:   A. BREAST, LEFT, LUMPECTOMY:  - Ductal carcinoma in situ, intermediate grade, involving areas of  sclerosing adenosis, see comment  - Resection margins are negative for DCIS; closest is the posterior  margin at 3 mm  - Biopsy site changes  - Calcifications  - See oncology table   B. BREAST, LEFT ADDITIONAL LATERAL MARGIN, EXCISION:  - Fibrocystic change with sclerosing adenosis and calcifications  - Negative for carcinoma   C. BREAST, LEFT ADDITIONAL POSTERIOR MARGIN, EXCISION:  - Benign breast parenchyma, negative for carcinoma    12/18/2020 Cancer Staging   Staging form: Breast, AJCC 8th Edition - Pathologic stage from 12/18/2020: Stage Unknown (pTis (DCIS), pNX, cM0, G2, ER+, PR+, HER2: Not Assessed) - Signed by Anginette Espejo K, NP on 06/27/2021 Stage prefix: Initial  diagnosis Histologic grading system: 3 grade system   06/27/2021 Survivorship    SCP delivered by Adahlia Stembridge, NP   07/2021 -  Anti-estrogen oral therapy   Pending start of chemoprevention with tamoxifen  in 07/2021    12/19/2021 Imaging   IMPRESSION: Breast MRI impression:  1. Area of non mass enhancement in the left breast has increased in size from the prior MRI. It lies just below cylinder shaped biopsy clip. Given the position of the post biopsy marker clip and the interval increase in size of this enhancement, re-biopsy is recommended. 2. No other areas of abnormal enhancement in the left breast. 3. No evidence of right breast malignancy.   RECOMMENDATION: 1. MRI guided core needle biopsy of the area of non mass enhancement in the left breast.   12/27/2021 Pathology Results   Diagnosis Breast, left, needle core biopsy, upper inner quadrant, cylinder clip - LOBULAR NEOPLASIA (ATYPICAL LOBULAR HYPERPLASIA). - FIBROCYSTIC CHANGES WITH USUAL DUCTAL HYPERPLASIA AND CALCIFICATIONS. - SEE NOTE.   02/06/2022 Surgery   Left breast lumpectomy by Dr. [name]   02/06/2022 Pathology Results   FINAL MICROSCOPIC DIAGNOSIS: A. BREAST, LEFT, LUMPECTOMY: Ductal carcinoma in-situ, apocrine type with clear margins of resection. Radial sclerosing lesion with extensive atypical lobular hyperplasia in the immediate vicinity and elsewhere in the breast. Multiple foci of sclerosing adenosis. Invasive carcinoma is not identified.  Estrogen receptors and progesterone receptors both negative   03/18/2022 -  Radiation Therapy   Adjuvant radiation by Dr. Dewey, plan to complete 04/15/2022   Malignant neoplasm of upper-outer quadrant of right female breast (HCC)  04/08/2024 Initial Diagnosis   Malignant neoplasm of upper-outer quadrant of right female breast (HCC)   04/21/2024 Cancer Staging   Staging form: Breast, AJCC 8th Edition - Clinical stage from 04/21/2024: Stage IA (cT1c, cN0, cM0, G2, ER+, PR-, HER2-) - Signed by [name], PA-C on 04/21/2024 Stage  prefix: Initial diagnosis Method of lymph node assessment: Clinical Histologic grading system: 3 grade system      CURRENT THERAPY: S/p bilateral mastectomy and reconstruction, antiestrogen therapy with anastrozole  starting 04/2024 (switched from tamoxifen  previously)  INTERVAL HISTORY [name] returns for postop follow-up, last seen by Dr. Bronson 04/14/2024.  In the interim she had Oncotype testing on the breast biopsy which showed RS of 20, no chemo indicated.  She switched tamoxifen  to anastrozole  due to the invasive nature of the new breast cancer.  She underwent bilateral mastectomy and reconstruction 06/23/2024 by Dr. Bury and Dr. Montorfano.  Final path showed no invasive carcinoma, negative lymph nodes, clear margins, and no lymphatic or vascular invasion.  She has been followed closely by plastics for small openings at the right and left reconstructed breasts.  Most recently she had bilateral seromas drained 1/23.   She is doing OK overall. Will see Dr. Montorfano again this week. A suture site has been draining all weekend so she is not sure if the expander will be filled. Left side is doing well. Denies fever/chills. She has been taking anastrozole  without difficulties. After surgery she is experiencing tinnitus and has been referred to ENT.   ROS  All other systems reviewed and negative   Past Medical History:  Diagnosis Date   Allergy    Anxiety    Back pain    Breast cancer (HCC)    Constipation    Depression    Ductal carcinoma in situ of left breast 11/09/2020   Family history of breast cancer 11/09/2020   HSV (herpes  simplex virus) anogenital infection    Hyperlipidemia    Hypothyroidism    Joint pain    Plantar fasciitis, bilateral    SOBOE (shortness of breath on exertion)      Past Surgical History:  Procedure Laterality Date   ADJACENT TISSUE TRANSFER/TISSUE REARRANGEMENT Bilateral 06/23/2024   Procedure: ADJACENT TISSUE TRANSFER WITH MESH PLACEMENT;   Surgeon: Montorfano, Lisandro M, MD;  Location: [facility];  Service: Plastics;  Laterality: Bilateral;   AXILLARY SENTINEL NODE BIOPSY Right 06/23/2024   Procedure: RIGHT AXILLARY SENTINEL LYMPH NODE BIOPSY;  Surgeon: [name], MD;  Location: [facility];  Service: General;  Laterality: Right;  RIGHT AXILLARY SENTINEL NODE BIOPSY   BREAST BIOPSY Right 02/28/2015   BREAST BIOPSY Left 07/11/2021   BREAST EXCISIONAL BIOPSY Right 03/29/2015   had seed placement as wel   BREAST LUMPECTOMY WITH RADIOACTIVE SEED LOCALIZATION Left 12/18/2020   Procedure: LEFT BREAST LUMPECTOMY WITH RADIOACTIVE SEED LOCALIZATION;  Surgeon: [name], MD;  Location: [facility];  Service: General;  Laterality: Left;   CARPAL TUNNEL RELEASE Right 2022   ENDOMETRIAL ABLATION     MOUTH SURGERY     RADIOACTIVE SEED GUIDED EXCISIONAL BREAST BIOPSY Right 04/03/2015   Procedure: RADIOACTIVE SEED GUIDED EXCISIONAL BREAST BIOPSY;  Surgeon: Cough Ebbie, MD;  Location: [facility];  Service: General;  Laterality: Right;   RADIOACTIVE SEED GUIDED EXCISIONAL BREAST BIOPSY Left 02/06/2022   Procedure: LEFT BREAST SEED GUIDED EXCISIONAL BIOPSY;  Surgeon: [name], MD;  Location: [facility];  Service: General;  Laterality: Left;  LMA   TISSUE EXPANDER PLACEMENT Bilateral 06/23/2024   Procedure: BILATERAL TISSUE EXPANDER PLACEMENT;  Surgeon: Montorfano, Lisandro M, MD;  Location: [facility];  Service: Plastics;  Laterality: Bilateral;   TOTAL MASTECTOMY Bilateral 06/23/2024   Procedure: BILATERAL MASTECTOMY, SIMPLE;  Surgeon: [name], MD;  Location: [facility];  Service: General;  Laterality: Bilateral;  GEN w/ PEC BLOCK RIGHT MASTECTOMY LEFT RISK REDUCING MASTECTOMY     Outpatient Encounter Medications as of 08/02/2024  Medication Sig   acetaminophen  (TYLENOL ) 500 MG tablet Take 1 tablet (500 mg total) by  mouth every 6 (six) hours as needed.   amoxicillin -clavulanate (AUGMENTIN ) 875-125 MG tablet Take 1 tablet by mouth 2 (two) times daily for 14 days.   anastrozole  (ARIMIDEX ) 1 MG tablet TAKE 1 TABLET BY MOUTH EVERY DAY   bacitracin  ointment Apply 1 Application topically 2 (two) times daily.   BIOTIN MAXIMUM PO Take by mouth.   Calcium Carb-Cholecalciferol (CALCIUM 600 + D PO) Take by mouth.   cholecalciferol (VITAMIN D3) 25 MCG (1000 UNIT) tablet Take 1,000 Units by mouth daily.   cyclobenzaprine  (FLEXERIL ) 5 MG tablet Take 1 tablet (5 mg total) by mouth 3 (three) times daily as needed for muscle spasms.   gabapentin  (NEURONTIN ) 300 MG capsule Take 1 capsule (300 mg total) by mouth 2 (two) times daily. (Patient not taking: Reported on 07/25/2024)   ibuprofen  (ADVIL ) 600 MG tablet Take 1 tablet (600 mg total) by mouth every 8 (eight) hours as needed.   LAVENDER OIL PO Take by mouth.   levothyroxine  (SYNTHROID ) 25 MCG tablet TAKE 1 TABLET BY MOUTH EVERY DAY BEFORE BREAKFAST   magnesium gluconate (MAGONATE) 500 (27 Mg) MG TABS tablet Take 500 mg by mouth in the morning and at bedtime.   Menaquinone-7 (VITAMIN K2 PO) Take by mouth.   Multiple Vitamin (MULTI-VITAMIN) tablet Take by mouth.   nitroGLYCERIN  (NITROGLYN)  2 % ointment Apply 0.5 inches topically in the morning and at bedtime.   Omega-3 Fatty Acids (OMEGA 3 FISH OIL PO) Take by mouth.   ondansetron  (ZOFRAN -ODT) 4 MG disintegrating tablet Take 1 tablet (4 mg total) by mouth every 8 (eight) hours as needed for nausea or vomiting.   oxyCODONE  (ROXICODONE ) 5 MG immediate release tablet Take 1 tablet (5 mg total) by mouth every 4 (four) hours as needed for severe pain (pain score 7-10).   Probiotic Product (ALIGN PO) Take by mouth.   No facility-administered encounter medications on file as of 08/02/2024.     There were no vitals filed for this visit. There is no height or weight on file to calculate BMI.   ECOG PERFORMANCE STATUS: 0 -  Asymptomatic  PHYSICAL EXAM Patient appears well by phone.  Voice is strong, speech is clear.  Mood/affect appear normal for situation.  No cough or conversational dyspnea   LAB DATA No labs for this encounter     ASSESSMENT & PLAN: 59 year old female  Invasive ductal carcinoma of right breast, stage I, ER 40% weak-mod+/PR-/HER2- -Diagnosed 04/2024 new invasive ductal carcinoma of the right breast, stage I, with a 1.3 cm ER+ (40% positive), PR-, HER2- tumor. Negative lymphovascular invasion and lymph nodes on images. Grade 2 tumor with KI-67 at 5%, indicating low proliferation, however concerning for aggressive cancer due to the weak to moderate ER positivity in 40% tumor cells.  -Oncotype 20, low risk in postmenopause, she did not require chemotherapy -S/p bilateral mastectomy by Dr. Ebbie with immediate reconstruction but Dr. Montorfano 06/23/2024. I reviewed surgical path which showed ALH on left breast, no residual carcinoma in either breast, and 0/2 LNs - it appears cancer was removed on biopsy  -She did not require postmastectomy radiation  -Ms. Ballman is healing from surgery, following closely with plastics. She will proceed on surveillance. Hold anastrozole  for a month for healing -Due to bilateral mastectomy, no role for mammograms -We will see her back in 2 months as scheduled, or sooner if needed    Left breast DCIS, grade 2, ER/PR+  -Screening detected; Biopsy on 10/16/20 confirmed intermediate grade DCIS, ER/PR+. -S/p left lumpectomy on 12/18/20 showed grade 2 DCIS with clear margins  -She declined adjuvant radiation due to family situation, she needed to be in [place] to take care of her father -she eventually started Tamoxifen  at survivorship visit 06/27/21. She developed joint pain about a month later, and her dose was dropped to 10 mg. She had fatigue, joint pain, and hot flashes and dose was further reduced to 5 mg 09/2021, tolerated much better -she has had two left  breast biopsies, on 04/29/21 and 07/11/21, both showing ALH. She continues surveillance with alternating mammography and breast MRI staggered 6 months apart -She developed a new left breast DCIS, grade 2, ER/PR - found on screening breast MRI 12/19/21 which was monitoring ALH (left breast biopsies 04/09/21 and 07/11/21); one of the abnormal areas had enlarged since the previous MRI and was rebiopsied 12/27/2021 again showing [facility] -Given the enlargement she underwent left lumpectomy by Dr. Ebbie 02/06/22, path showed grade 2 DCIS, apocrine type, with clear margins and extensive ALH within the breast.  ER/PR negative -This is a new DCIS, not a recurrence, but has been completely removed in surgery which was the curative treatment -S/p adjuvant radiation by Dr. Dewey 03/18/2022- 04/15/2022 -She began tamoxifen  after radiation, tolerated lower dose 10 mg every other day. Switched to Anastrozole  04/2024 with postmenopause and invasive  cancer see above    PLAN: -Reviewed surgical path -Continue close postop follow-up with plastic surgery -ENT referral for tinnitus per Dr. Ebbie is pending -Resume anastrozole  in a month as long as she has healed well from surgery -Follow-up in March as scheduled, or sooner if needed -I reviewed the case with Dr. Lanny    I discussed the assessment and treatment plan with the patient. The patient was provided an opportunity to ask questions and all were answered. The patient agreed with the plan and demonstrated an understanding of the instructions.   The patient was advised to call back or seek an in-person evaluation if the symptoms worsen or if the condition fails to improve as anticipated. I spent 12 minutes counseling the patient non face to face. The total time spent in the appointment was 15 minutes and more than 50% was on counseling, review of test results, and coordination of care.   Yocelin Vanlue K Champion Corales, NP 08/02/2024  "

## 2024-08-04 ENCOUNTER — Ambulatory Visit

## 2024-08-04 VITALS — BP 139/85 | HR 80

## 2024-08-04 DIAGNOSIS — C50411 Malignant neoplasm of upper-outer quadrant of right female breast: Secondary | ICD-10-CM

## 2024-08-04 DIAGNOSIS — L7634 Postprocedural seroma of skin and subcutaneous tissue following other procedure: Secondary | ICD-10-CM

## 2024-08-04 DIAGNOSIS — Z9889 Other specified postprocedural states: Secondary | ICD-10-CM

## 2024-08-04 DIAGNOSIS — Z09 Encounter for follow-up examination after completed treatment for conditions other than malignant neoplasm: Secondary | ICD-10-CM

## 2024-08-04 DIAGNOSIS — Z923 Personal history of irradiation: Secondary | ICD-10-CM

## 2024-08-04 MED ORDER — AMOXICILLIN-POT CLAVULANATE 875-125 MG PO TABS: 1.0000 | ORAL_TABLET | Freq: Two times a day (BID) | ORAL | 0 refills | Status: AC

## 2024-08-04 NOTE — Progress Notes (Addendum)
 "  Established Patient Office Visit  Subjective   Patient ID: AVERYANNA Yates, female    DOB: Oct 24, 1965  Age: 59 y.o. MRN: [id number]  Chief Complaint  Patient presents with   Post-op Follow-up    HPI 59 year old female who recently underwent bilateral mastectomies with bilateral breast reconstruction with insertion of tissue expanders on 06/23/2024. Intraoperatively, patient had 475 cc expanders placed. Patient is 5 weeks postop. She presents to the clinic today for postoperative follow-up. She still has a small opening on the left side at the T-point that has opened again and another small opening at medial IMF incision on the right that was treated with silver nitrate and it does not seem to be communicating with a tissue expander but has not healed. Fluid has accumulated again on bilateral breasts. Small amount of exudate on steri strips noticed over the weekend.  Pain has been doing somewhat controlled.  Reports no fevers chills erythema or any other signs or symptoms of infection. Continues on Augmentin  PO. Anastrozole  therapy was discotinued yesterday, after patient saw oncology, since this may be what's affecting wound healing.     Objective:     BP 139/85 (BP Location: Left Arm, Patient Position: Sitting, Cuff Size: Normal)   Pulse 80   LMP 07/07/2018   SpO2 91%  BP Readings from Last 3 Encounters:  08/04/24 139/85  07/29/24 120/68  07/27/24 122/76    Physical Exam MA as chaperone Bilateral breasts Steri-Strips still in place. No signs of infection.  Superficial abrasion to the left superior/lateral breast has resolved and reepithelialized.  I removed the Steri-Strips on the medial IMF of the right breast clean the necrotic tissue from silver nitrate treatment the wound edges look clean.  Under sterile technique the necrotic tissue was removed, two 3-0 prolene stitches were placed to approximate the wound leaving a gap between stitches to allow for drainage if needed. The T  point breakdown on the left chest has reopened, under sterile technique the necrotic tissue was removed, two 3-0 prolene stitches were placed to approximate the wound leaving a gap between stitches to allow for drainage if needed. New Steri-Strips applied on the incisions. Recurrence of seromas bilaterally.  No signs of infection.  Procedure: Sterile aspiration of bilateral breast seromas  Indication: Postoperative seroma formation following bilateral tissue expander placement.  Description: Verbal consent was obtained. The patient was positioned supine and the bilateral breast areas were exposed. The bilateral breasts were prepped in standard sterile fashion using antiseptic solution and allowed to dry. Sterile gloves were donned, and a sterile field was maintained throughout the procedure.  Using sterile technique, a needle and syringe were introduced into the seroma cavity of the right breast, and 40 cc of clear serous fluid was aspirated without difficulty. The needle was withdrawn, and hemostasis was achieved with gentle pressure.  Attention was then turned to the left breast, where a separate sterile needle and syringe were used. 100 cc of clear serous fluid was aspirated in a similar sterile manner. The needle was removed, and hemostasis was again achieved.  The patient tolerated the procedure well. No immediate complications were noted. Sterile dressings were applied to both aspiration sites.    The 10-year ASCVD risk score (Arnett DK, et al., 2019) is: 2.4%    Assessment & Plan:   Problem List Items Addressed This Visit       Other   Malignant neoplasm of upper-outer quadrant of right female breast (HCC)   Relevant Medications  amoxicillin -clavulanate (AUGMENTIN ) 875-125 MG tablet   Other Visit Diagnoses       S/P breast reconstruction    -  Primary     Follow-up exam         History of radiation therapy          Bilateral seromas drained today without complication.  Instructed to monitor for signs and symptoms of infection, including fevers chills redness or swelling of the breasts.  We will hold off on expansion until the wounds are completely healed. Will hold off on physical therapy until the wounds are completely healed. Continue antibiotic therapy for now. Patient will follow-up with me closely, next follow-up appointment will be on Monday with PA and with me on Wednesday or earlier if any questions or concerns.  All questions answered to patient satisfaction.   Annalysia Willenbring M Marla Pouliot, MD  "

## 2024-08-08 ENCOUNTER — Encounter: Admitting: Student

## 2024-08-10 ENCOUNTER — Encounter: Payer: Self-pay | Admitting: Hematology

## 2024-08-10 ENCOUNTER — Ambulatory Visit

## 2024-08-10 VITALS — BP 128/83 | HR 77

## 2024-08-10 DIAGNOSIS — C50411 Malignant neoplasm of upper-outer quadrant of right female breast: Secondary | ICD-10-CM

## 2024-08-10 DIAGNOSIS — Z09 Encounter for follow-up examination after completed treatment for conditions other than malignant neoplasm: Secondary | ICD-10-CM

## 2024-08-10 DIAGNOSIS — Z923 Personal history of irradiation: Secondary | ICD-10-CM

## 2024-08-10 DIAGNOSIS — Z9889 Other specified postprocedural states: Secondary | ICD-10-CM

## 2024-08-10 NOTE — Progress Notes (Signed)
 "  Established Patient Office Visit  Subjective   Patient ID: ANJELA CASSARA, female    DOB: [birth year]  Age: 59 y.o. MRN: [id number]  Chief Complaint  Patient presents with   post op    HPI 59 year old female who recently underwent bilateral mastectomies with bilateral breast reconstruction with insertion of tissue expanders on 06/23/2024. Intraoperatively, patient had 475 cc expanders placed. Patient is 6 weeks postop. She presents to the clinic today for postoperative follow-up.  Steri-Strips are in place.  Comes accompanied by husband.  Denies fevers chills or any systemic symptoms.  Patient comes accompanied by husband.    Objective:     BP 128/83 (BP Location: Left Arm, Patient Position: Sitting, Cuff Size: Normal)   Pulse 77   LMP 07/07/2018   SpO2 97%  BP Readings from Last 3 Encounters:  08/10/24 128/83  08/04/24 139/85  07/29/24 120/68    Physical Exam MA as chaperone Bilateral breasts Steri-Strips still in place. No signs of infection.  Superficial abrasion to the left superior/lateral breast has resolved and reepithelialized.  I removed the Steri-Strips on the medial IMF of the right breast and under sterile conditions I cleaned the necrotic tissue and the wound edges looked clean.  She is experiencing delayed healing on the side.  The expander is not exposed.  Two 3-0 prolene stitches were placed to approximate the wound leaving a gap between stitches to allow for drainage if needed. The T point breakdown on the left chest has reopened, under sterile technique the necrotic tissue was removed, two 3-0 prolene stitches were placed to approximate the wound leaving a gap between stitches to allow for drainage if needed.  Another wound on the vertical limb was noticed with superficial skin necrosis.  This was cleaned as well and reapproximated.  No expander of mesh was visualized.  New Steri-Strips applied on the incisions. Recurrence of mild seromas bilaterally.  No signs of  infection.   No results found for any visits on 08/10/24.  Last CBC Lab Results  Component Value Date   WBC 4.7 06/27/2024   HGB 11.5 (L) 06/27/2024   HCT 34.0 (L) 06/27/2024   MCV 94.4 06/27/2024   MCH 31.9 06/27/2024   RDW 12.5 06/27/2024   PLT 199 06/27/2024    The 10-year ASCVD risk score (Arnett DK, et al., 2019) is: 2.1%    Assessment & Plan:   Problem List Items Addressed This Visit       Other   Malignant neoplasm of upper-outer quadrant of right female breast (HCC)   Other Visit Diagnoses       S/P breast reconstruction    -  Primary     Follow-up exam         History of radiation therapy           I had a conversation with the patient regarding delayed wound healing and potential bilateral expander loss.  I explained to the patient that this will be our last attempt to try to save the tissue expanders, as patient is not showing signs of wound healing in this area and my concern is that if the expanders will get exposed and she will develop an infection. We will hold off on expansion until the wounds are healed. Will hold off on physical therapy until the wounds are completely healed.  Finish antibiotic therapy for now. Patient will follow-up with me closely, next follow-up appointment will be on Friday or earlier if any questions or concerns.  Patient instructed to notify the office right away if she develops any systemic signs or symptoms or she notices new complications.  All questions answered to patient satisfaction.    Torrian Canion M Errika Narvaiz, MD  "

## 2024-08-12 ENCOUNTER — Ambulatory Visit

## 2024-08-12 DIAGNOSIS — C50411 Malignant neoplasm of upper-outer quadrant of right female breast: Secondary | ICD-10-CM

## 2024-08-12 DIAGNOSIS — Z09 Encounter for follow-up examination after completed treatment for conditions other than malignant neoplasm: Secondary | ICD-10-CM

## 2024-08-12 DIAGNOSIS — D0512 Intraductal carcinoma in situ of left breast: Secondary | ICD-10-CM

## 2024-08-12 DIAGNOSIS — Z923 Personal history of irradiation: Secondary | ICD-10-CM

## 2024-08-12 DIAGNOSIS — Z9889 Other specified postprocedural states: Secondary | ICD-10-CM

## 2024-08-12 MED ORDER — VITAMIN A-BETA CAROTENE 25000 UNITS PO CAPS: 25000.0000 [IU] | ORAL_CAPSULE | Freq: Every day | ORAL | 0 refills | Status: AC

## 2024-08-12 NOTE — Progress Notes (Signed)
" ° °  Established Patient Office Visit  Subjective   Patient ID: Tara Yates, female    DOB: 1966-03-22  Age: 59 y.o. MRN: [id number]  No chief complaint on file.   HPI 59 year old female who recently underwent bilateral mastectomies with bilateral breast reconstruction with insertion of tissue expanders on 06/23/2024. Intraoperatively, patient had 475 cc expanders placed. Patient is 7 weeks postop. She presents to the clinic today for postoperative follow-up.  Steri-Strips are in place.  Comes accompanied by husband.  Denies fevers chills or any systemic symptoms.      Objective:     LMP 07/07/2018    Physical Exam Bilateral breasts Steri-Strips still in place. No signs of infection. Superficial abrasion to the left superior/lateral breast has resolved and reepithelialized. Steri strips clean and dry. Recurrence of mild seromas bilaterally.  No signs of infection.   Procedure: Sterile aspiration of bilateral breast seromas  Indication: Postoperative seroma formation following bilateral tissue expander placement.  Description: Verbal consent was obtained. The patient was positioned supine and the bilateral breast areas were exposed. The bilateral breasts were prepped in standard sterile fashion using antiseptic solution and allowed to dry. Sterile gloves were donned, and a sterile field was maintained throughout the procedure.   Using sterile technique, a needle and syringe were introduced into the seroma cavity of the right breast, and 50 cc of clear serous fluid was aspirated without difficulty. The needle was withdrawn, and hemostasis was achieved with gentle pressure.  Attention was then turned to the left breast, where a separate sterile needle and syringe were used. 80 cc of clear serous fluid was aspirated in a similar sterile manner. The needle was removed, and hemostasis was again achieved.  The patient tolerated the procedure well. No immediate complications were noted.  Sterile dressings were applied to both aspiration sites.    Assessment & Plan:   Problem List Items Addressed This Visit       Other   Ductal carcinoma in situ of left breast   Malignant neoplasm of upper-outer quadrant of right female breast (HCC)   Other Visit Diagnoses       S/P breast reconstruction    -  Primary     Follow-up exam         History of radiation therapy           Bilateral seromas drained today, patient tolerated well. We will start vitamin A  today and start referral to hyperbaric oxygen chamber. Patient will follow-up with me closely, next follow-up appointment will be on Wednesday or earlier if any questions or concerns.  Patient instructed to notify the office right away if she develops any systemic signs or symptoms or she notices new complications.  All questions answered to patient satisfaction.    Hyman Crossan M Angelynn Lemus, MD  "

## 2024-08-17 ENCOUNTER — Encounter

## 2024-08-23 ENCOUNTER — Institutional Professional Consult (permissible substitution) (INDEPENDENT_AMBULATORY_CARE_PROVIDER_SITE_OTHER): Admitting: Physician Assistant

## 2024-09-14 ENCOUNTER — Encounter

## 2024-09-20 ENCOUNTER — Inpatient Hospital Stay

## 2024-09-20 ENCOUNTER — Inpatient Hospital Stay: Admitting: Nurse Practitioner

## 2024-09-20 ENCOUNTER — Inpatient Hospital Stay: Admitting: Hematology

## 2024-09-20 ENCOUNTER — Inpatient Hospital Stay: Attending: Hematology

## 2024-11-15 ENCOUNTER — Other Ambulatory Visit (HOSPITAL_BASED_OUTPATIENT_CLINIC_OR_DEPARTMENT_OTHER)
